# Patient Record
Sex: Female | Born: 1960 | Race: Black or African American | Hispanic: No | Marital: Married | Smoking: Never smoker
Health system: Southern US, Community
[De-identification: ages and names within clinical notes are randomized; demographics above are authoritative.]

## PROBLEM LIST (undated history)

## (undated) DIAGNOSIS — I639 Cerebral infarction, unspecified: Secondary | ICD-10-CM

## (undated) DIAGNOSIS — I10 Essential (primary) hypertension: Secondary | ICD-10-CM

## (undated) HISTORY — PX: OOPHORECTOMY: SHX86

## (undated) HISTORY — PX: TUBAL LIGATION: SHX77

---

## 2004-09-26 ENCOUNTER — Ambulatory Visit: Payer: Self-pay | Admitting: Family Medicine

## 2005-01-09 ENCOUNTER — Ambulatory Visit: Payer: Self-pay | Admitting: Family Medicine

## 2005-05-08 ENCOUNTER — Ambulatory Visit: Payer: Self-pay | Admitting: Family Medicine

## 2005-05-08 ENCOUNTER — Other Ambulatory Visit: Admission: RE | Admit: 2005-05-08 | Discharge: 2005-05-08 | Payer: Self-pay | Admitting: Family Medicine

## 2005-06-04 ENCOUNTER — Ambulatory Visit: Payer: Self-pay | Admitting: Internal Medicine

## 2005-08-02 ENCOUNTER — Encounter: Admission: RE | Admit: 2005-08-02 | Discharge: 2005-08-02 | Payer: Self-pay | Admitting: Family Medicine

## 2016-09-14 ENCOUNTER — Encounter (HOSPITAL_COMMUNITY): Payer: Self-pay | Admitting: Emergency Medicine

## 2016-09-14 ENCOUNTER — Ambulatory Visit (HOSPITAL_COMMUNITY)
Admission: EM | Admit: 2016-09-14 | Discharge: 2016-09-14 | Disposition: A | Discharge status: Home / Self Care | Payer: 59 | Attending: Emergency Medicine | Admitting: Emergency Medicine

## 2016-09-14 DIAGNOSIS — R319 Hematuria, unspecified: Secondary | ICD-10-CM | POA: Diagnosis present

## 2016-09-14 DIAGNOSIS — I10 Essential (primary) hypertension: Secondary | ICD-10-CM | POA: Insufficient documentation

## 2016-09-14 DIAGNOSIS — N390 Urinary tract infection, site not specified: Secondary | ICD-10-CM | POA: Diagnosis present

## 2016-09-14 DIAGNOSIS — R300 Dysuria: Secondary | ICD-10-CM | POA: Diagnosis present

## 2016-09-14 DIAGNOSIS — Z8673 Personal history of transient ischemic attack (TIA), and cerebral infarction without residual deficits: Secondary | ICD-10-CM | POA: Diagnosis not present

## 2016-09-14 DIAGNOSIS — Z7982 Long term (current) use of aspirin: Secondary | ICD-10-CM | POA: Insufficient documentation

## 2016-09-14 DIAGNOSIS — N3001 Acute cystitis with hematuria: Secondary | ICD-10-CM | POA: Diagnosis not present

## 2016-09-14 HISTORY — DX: Essential (primary) hypertension: I10

## 2016-09-14 HISTORY — DX: Cerebral infarction, unspecified: I63.9

## 2016-09-14 LAB — POCT URINALYSIS DIP (DEVICE)
Bilirubin Urine: NEGATIVE
Glucose, UA: NEGATIVE mg/dL
Ketones, ur: NEGATIVE mg/dL
Nitrite: NEGATIVE
Protein, ur: 100 mg/dL — AB
Specific Gravity, Urine: 1.02 (ref 1.005–1.030)
UROBILINOGEN UA: 0.2 mg/dL (ref 0.0–1.0)
pH: 7 (ref 5.0–8.0)

## 2016-09-14 MED ORDER — PHENAZOPYRIDINE HCL 200 MG PO TABS: 200.0000 mg | ORAL_TABLET | Freq: Three times a day (TID) | ORAL | 0 refills | Status: DC

## 2016-09-14 MED ORDER — CEPHALEXIN 500 MG PO CAPS: 500.0000 mg | ORAL_CAPSULE | Freq: Four times a day (QID) | ORAL | 0 refills | Status: DC

## 2016-09-14 NOTE — ED Provider Notes (Signed)
[facility]    CSN: [id number] Arrival date & time: 09/14/16  1611     History   Chief Complaint Chief Complaint  Patient presents with  . Urinary Tract Infection    HPI Rhonda Cortez is a 55 y.o. female.   HPI  She is a 55 year old woman here for evaluation of dysuria. Her symptoms started this morning with urgency, frequency, dysuria, and hematuria. She denies abdominal pain, nausea, vomiting, fevers, flank pain. She did have sex recently after a long period of celibacy.   Past Medical History:  Diagnosis Date  . Hypertension   . Stroke [facility](HCC)     There are no active problems to display for this patient.   Past Surgical History:  Procedure Laterality Date  . OOPHORECTOMY    . TUBAL LIGATION      OB History    No data available       Home Medications    Prior to Admission medications   Medication Sig Start Date End Date Taking? Authorizing Provider  amLODipine (NORVASC) 10 MG tablet Take 10 mg by mouth daily.   Yes Historical Provider, MD  aspirin 325 MG tablet Take 325 mg by mouth daily.   Yes Historical Provider, MD  cephALEXin (KEFLEX) 500 MG capsule Take 1 capsule (500 mg total) by mouth 4 (four) times daily. 09/14/16   Charm RingsErin J Danaysha Kirn, MD  phenazopyridine (PYRIDIUM) 200 MG tablet Take 1 tablet (200 mg total) by mouth 3 (three) times daily. 09/14/16   Charm RingsErin J Masen Luallen, MD    Family History History reviewed. No pertinent family history.  Social History Social History  Substance Use Topics  . Smoking status: Never Smoker  . Smokeless tobacco: Never Used  . Alcohol use No     Allergies   Patient has no known allergies.   Review of Systems Review of Systems As in history of present illness  Physical Exam Triage Vital Signs ED Triage Vitals [09/14/16 1650]  Enc Vitals Group     BP 156/74     Pulse Rate 92     Resp 16     Temp 98.4 F (36.9 C)     Temp Source Oral     SpO2 100 %     Weight      Height      Head Circumference    Peak Flow      Pain Score      Pain Loc      Pain Edu?      Excl. in GC?    No data found.   Updated Vital Signs BP 156/74 (BP Location: Left Arm)   Pulse 92   Temp 98.4 F (36.9 C) (Oral)   Resp 16   SpO2 100%   Visual Acuity Right Eye Distance:   Left Eye Distance:   Bilateral Distance:    Right Eye Near:   Left Eye Near:    Bilateral Near:     Physical Exam  Constitutional: She is oriented to person, place, and time. She appears well-developed and well-nourished. No distress.  Cardiovascular: Normal rate.   Pulmonary/Chest: Effort normal.  Abdominal:  No CVA tenderness  Neurological: She is alert and oriented to person, place, and time.     UC Treatments / Results  Labs (all labs ordered are listed, but only abnormal results are displayed) Labs Reviewed  POCT URINALYSIS DIP (DEVICE) - Abnormal; Notable for the following:       Result Value   Hgb  urine dipstick LARGE (*)    Protein, ur 100 (*)    Leukocytes, UA SMALL (*)    All other components within normal limits  URINE CULTURE    EKG  EKG Interpretation None       Radiology No results found.  Procedures Procedures (including critical care time)  Medications Ordered in UC Medications - No data to display   Initial Impression / Assessment and Plan / UC Course  I have reviewed the triage vital signs and the nursing notes.  Pertinent labs & imaging results that were available during my care of the patient were reviewed by me and considered in my medical decision making (see chart for details).  Clinical Course     Treatment with Keflex and Pyridium. Urine sent for culture. Follow up as needed.  Final Clinical Impressions(s) / UC Diagnoses   Final diagnoses:  Acute cystitis with hematuria    New Prescriptions New Prescriptions   CEPHALEXIN (KEFLEX) 500 MG CAPSULE    Take 1 capsule (500 mg total) by mouth 4 (four) times daily.   PHENAZOPYRIDINE (PYRIDIUM) 200 MG TABLET    Take 1  tablet (200 mg total) by mouth 3 (three) times daily.     Charm RingsErin J Loralyn Rachel, MD 09/14/16 [phone]

## 2016-09-14 NOTE — ED Triage Notes (Signed)
Here for UTI sx onset today associated w/hematuira, urinary urgency/freq, dysuria  Denies fevers, n/v, abd pain  A&O x4... NAD

## 2016-09-14 NOTE — Discharge Instructions (Signed)
You have a urinary tract infection. Take Keflex 4 times a day for 5 days. Use the Pyridium 3 times a day to numb the bladder while the antibiotics take effect. This will turn your urine orange. I sent your urine for culture. We will call you if we need to change antibiotics. Follow-up as needed.

## 2016-09-16 LAB — URINE CULTURE: Culture: 70000 — AB

## 2016-09-17 ENCOUNTER — Telehealth (HOSPITAL_COMMUNITY): Payer: Self-pay | Admitting: Emergency Medicine

## 2016-09-17 NOTE — Telephone Encounter (Signed)
LM on [phone] Called to give lab results and to see how pt is doing from recent visit on 09/14/16 Notified pt in gen message of abnormal results and that there is NO need to call back Unless not feeling any better, not tolerating meds well or if wanting to know lab results. Also let pt know labs can be obtained from MyChart

## 2016-09-17 NOTE — Telephone Encounter (Signed)
-----   Message from Eustace MooreLaura W Murray, MD sent at 09/16/2016  2:37 PM EST ----- Please let patient know that urine culture was positive for E coli, [facility]  Recheck for further evaluation if symptoms persist.  LM

## 2019-12-23 DIAGNOSIS — I639 Cerebral infarction, unspecified: Secondary | ICD-10-CM | POA: Insufficient documentation

## 2020-01-06 ENCOUNTER — Ambulatory Visit: Payer: Self-pay | Attending: Family

## 2020-01-06 DIAGNOSIS — Z23 Encounter for immunization: Secondary | ICD-10-CM

## 2020-01-06 NOTE — Progress Notes (Signed)
   Covid-19 Vaccination Clinic  Name:  Rhonda Cortez    MRN: [id number] DOB: Jan 10, 1961  01/06/2020  Rhonda Cortez was observed post Covid-19 immunization for 15 minutes without incident. She was provided with Vaccine Information Sheet and instruction to access the V-Safe system.   Rhonda Cortez was instructed to call 911 with any severe reactions post vaccine: Marland Kitchen Difficulty breathing  . Swelling of face and throat  . A fast heartbeat  . A bad rash all over body  . Dizziness and weakness   Immunizations Administered    Name Date Dose VIS Date Route   Moderna COVID-19 Vaccine 01/06/2020  3:25 PM 0.5 mL 09/14/2019 Intramuscular   Manufacturer: Moderna   Lot: [id number]   NDC: 90383-338-32

## 2020-01-06 NOTE — Progress Notes (Signed)
   Covid-19 Vaccination Clinic  Name:  Rhonda Cortez    MRN: [id number] DOB: 04/10/1961  01/06/2020  [name] was observed post Covid-19 immunization for 15 minutes without incident. She was provided with Vaccine Information Sheet and instruction to access the V-Safe system.   [name] was instructed to call 911 with any severe reactions post vaccine: . Difficulty breathing  . Swelling of face and throat  . A fast heartbeat  . A bad rash all over body  . Dizziness and weakness   Immunizations Administered    Name Date Dose VIS Date Route   Moderna COVID-19 Vaccine 01/06/2020  3:25 PM 0.5 mL 09/14/2019 Intramuscular   Manufacturer: Moderna   Lot: [id number]   NDC: 80777-273-10     

## 2020-02-08 ENCOUNTER — Ambulatory Visit: Payer: Self-pay | Attending: Family

## 2020-02-08 DIAGNOSIS — Z23 Encounter for immunization: Secondary | ICD-10-CM

## 2020-02-08 NOTE — Progress Notes (Signed)
   Covid-19 Vaccination Clinic  Name:  Rhonda Cortez    MRN: [id number] DOB: [birth year]  02/08/2020  [name] was observed post Covid-19 immunization for 15 minutes without incident. She was provided with Vaccine Information Sheet and instruction to access the V-Safe system.   [name] was instructed to call 911 with any severe reactions post vaccine: Marland Kitchen Difficulty breathing  . Swelling of face and throat  . A fast heartbeat  . A bad rash all over body  . Dizziness and weakness   Immunizations Administered    Name Date Dose VIS Date Route   Moderna COVID-19 Vaccine 02/08/2020 11:51 AM 0.5 mL 09/2019 Intramuscular   Manufacturer: Moderna   Lot: [id number]   NDC: 84210-312-81

## 2020-04-17 ENCOUNTER — Ambulatory Visit (HOSPITAL_COMMUNITY)
Admission: EM | Admit: 2020-04-17 | Discharge: 2020-04-17 | Disposition: A | Discharge status: Home / Self Care | Payer: PRIVATE HEALTH INSURANCE | Attending: Family Medicine | Admitting: Family Medicine

## 2020-04-17 ENCOUNTER — Other Ambulatory Visit: Payer: Self-pay

## 2020-04-17 ENCOUNTER — Encounter (HOSPITAL_COMMUNITY): Payer: Self-pay | Admitting: Emergency Medicine

## 2020-04-17 ENCOUNTER — Ambulatory Visit (INDEPENDENT_AMBULATORY_CARE_PROVIDER_SITE_OTHER): Payer: PRIVATE HEALTH INSURANCE

## 2020-04-17 ENCOUNTER — Telehealth (HOSPITAL_COMMUNITY): Payer: Self-pay | Admitting: Family Medicine

## 2020-04-17 DIAGNOSIS — M79644 Pain in right finger(s): Secondary | ICD-10-CM

## 2020-04-17 DIAGNOSIS — S63639A Sprain of interphalangeal joint of unspecified finger, initial encounter: Secondary | ICD-10-CM

## 2020-04-17 DIAGNOSIS — M79641 Pain in right hand: Secondary | ICD-10-CM

## 2020-04-17 DIAGNOSIS — M7989 Other specified soft tissue disorders: Secondary | ICD-10-CM

## 2020-04-17 MED ORDER — DICLOFENAC SODIUM 75 MG PO TBEC: 75.0000 mg | DELAYED_RELEASE_TABLET | Freq: Two times a day (BID) | ORAL | 0 refills | Status: DC

## 2020-04-17 NOTE — Telephone Encounter (Signed)
Resending rx from visit earlier today

## 2020-04-17 NOTE — ED Triage Notes (Signed)
Pt c/o of rt pointer finger pain on set Saturday. No injury noted to area. Pt did fall 1 month ago and injured rt middle finger. Pt denies taking advil/tylenol for pain.

## 2020-04-21 ENCOUNTER — Other Ambulatory Visit: Payer: Self-pay

## 2020-04-21 ENCOUNTER — Ambulatory Visit (INDEPENDENT_AMBULATORY_CARE_PROVIDER_SITE_OTHER): Payer: PRIVATE HEALTH INSURANCE | Admitting: Surgical

## 2020-04-21 DIAGNOSIS — S63631A Sprain of interphalangeal joint of left index finger, initial encounter: Secondary | ICD-10-CM

## 2020-04-21 DIAGNOSIS — S63639A Sprain of interphalangeal joint of unspecified finger, initial encounter: Secondary | ICD-10-CM

## 2020-04-27 ENCOUNTER — Telehealth: Payer: Self-pay

## 2020-04-27 NOTE — Telephone Encounter (Signed)
LMOM for patient to call back so we can schedule her an appt with August Saucer next week per Franky Macho to check her swelling

## 2020-05-01 NOTE — ED Provider Notes (Signed)
[facility]   [id number] 04/17/20 Arrival Time: 5956  ASSESSMENT & PLAN:  1. Volar plate injury of finger, initial encounter   2. Finger pain, right     I have personally viewed the imaging studies ordered this visit. Volar plate injury of the mid right 3rd finger.   Meds ordered this encounter  Medications  . diclofenac (VOLTAREN) 75 MG EC tablet    Sig: Take 1 tablet (75 mg total) by mouth 2 (two) times daily.    Dispense:  14 tablet    Refill:  0    Orders Placed This Encounter  Procedures  . DG Hand Complete Right    Recommend:  Follow-up Information    Schedule an appointment as soon as possible for a visit  with Bradly Bienenstock, MD.   Specialty: Orthopedic Surgery Contact information: [address] [place] [address] [phone]                Reviewed expectations re: course of current medical issues. Questions answered. Outlined signs and symptoms indicating need for more acute intervention. Patient verbalized understanding. After Visit Summary given.  SUBJECTIVE: History from: patient. Rhonda Cortez is a 59 y.o. female who reports right 2nd and 3rd finger pain. One month ago injury to R 3rd finger reported. No injury to 2nd finger. No extremity sensation changes or weakness. OTC Tylenol with some help.   Past Surgical History:  Procedure Laterality Date  . OOPHORECTOMY    . TUBAL LIGATION        OBJECTIVE:  Vitals:   04/17/20 1048  BP: (!) 155/95  Pulse: 80  Resp: 16  Temp: 97.9 F (36.6 C)  TempSrc: Oral  SpO2: 100%    General appearance: alert; no distress HEENT: [place]; AT Neck: supple with FROM Resp: unlabored respirations Extremities: . TTP over mid R 3rd digit; trouble with full flexion; normal sensation; normal cap refill Skin: warm and dry; no visible rashes Psychological: alert and cooperative; normal mood and affect  Imaging: DG Hand Complete Right  Result Date: 04/17/2020 CLINICAL DATA:   Injury. Second and third finger pain. Pain for 2 weeks after falling. EXAM: RIGHT HAND - COMPLETE 3+ VIEW COMPARISON:  None. FINDINGS: There is a volar plate injury of the middle phalanx of the third digit. There is associated soft tissue swelling. The index finger is unremarkable. No other fractures. Degenerative changes are identified at the distal interphalangeal joint of the index finger. IMPRESSION: Volar plate injury of the middle phalanx of the third digit. Electronically Signed   By: Norva Pavlov M.D.   On: 04/17/2020 11:47      No Known Allergies  Past Medical History:  Diagnosis Date  . Hypertension   . Stroke [facility])    Social History   Socioeconomic History  . Marital status: Married    Spouse name: Not on file  . Number of children: Not on file  . Years of education: Not on file  . Highest education level: Not on file  Occupational History  . Not on file  Tobacco Use  . Smoking status: Never Smoker  . Smokeless tobacco: Never Used  Substance and Sexual Activity  . Alcohol use: No  . Drug use: Never  . Sexual activity: Not on file  Other Topics Concern  . Not on file  Social History Narrative  . Not on file   Social Determinants of Health   Financial Resource Strain:   . Difficulty of Paying Living Expenses:  Food Insecurity:   . Worried About Programme researcher, broadcasting/film/video in the Last Year:   . Barista in the Last Year:   Transportation Needs:   . Freight forwarder (Medical):   Marland Kitchen Lack of Transportation (Non-Medical):   Physical Activity:   . Days of Exercise per Week:   . Minutes of Exercise per Session:   Stress:   . Feeling of Stress :   Social Connections:   . Frequency of Communication with Friends and Family:   . Frequency of Social Gatherings with Friends and Family:   . Attends Religious Services:   . Active Member of Clubs or Organizations:   . Attends Banker Meetings:   Marland Kitchen Marital Status:    Family History  Problem  Relation Age of Onset  . Hypertension Father    Past Surgical History:  Procedure Laterality Date  . OOPHORECTOMY    . TUBAL LIGATION        Mardella Layman, MD 05/01/20 [phone]

## 2020-05-17 ENCOUNTER — Encounter: Payer: Self-pay | Admitting: Surgical

## 2020-05-17 ENCOUNTER — Ambulatory Visit: Payer: PRIVATE HEALTH INSURANCE | Admitting: Orthopedic Surgery

## 2020-05-17 ENCOUNTER — Ambulatory Visit (INDEPENDENT_AMBULATORY_CARE_PROVIDER_SITE_OTHER): Payer: PRIVATE HEALTH INSURANCE

## 2020-05-17 DIAGNOSIS — M79646 Pain in unspecified finger(s): Secondary | ICD-10-CM

## 2020-05-17 DIAGNOSIS — S63639A Sprain of interphalangeal joint of unspecified finger, initial encounter: Secondary | ICD-10-CM

## 2020-05-17 DIAGNOSIS — M79641 Pain in right hand: Secondary | ICD-10-CM

## 2020-05-17 DIAGNOSIS — S63632A Sprain of interphalangeal joint of right middle finger, initial encounter: Secondary | ICD-10-CM | POA: Diagnosis not present

## 2020-05-17 NOTE — Progress Notes (Signed)
Office Visit Note   Patient: Rhonda Cortez           Date of Birth: October 29, 1960           MRN: [id number] Visit Date: 04/21/2020 Requested by: No referring provider defined for this encounter. PCP: Patient, No Pcp Per  Subjective: Chief Complaint  Patient presents with  . Right Hand - Pain    HPI: Rhonda Cortez is a 59 y.o. female who presents to the office complaining of finger pain.  Patient complains of distal right index finger pain as well as middle finger pain.She has had pain for several weeks following a fall.  She notes middle finger pain is improving significantly and swelling has improved but persists to a lesser degree.  Her main source of pain is her right index finger DIP joint.  She is using Diclofenac.Denies history of RA or gout.                ROS: All systems reviewed are negative as they relate to the chief complaint within the history of present illness.  Patient denies fevers or chills.  Assessment & Plan: Visit Diagnoses:  1. Volar plate injury of finger, initial encounter     Plan: Patient is a 59 y.o. Female who presents complaining of right finger pains.  She has radiographs that were reviewed and revealed degeneration of the index DIP joint with a small volar plate fracture of the middle finger PIP joint. The volar plate injury involves <30% of the joint surface so plan for nonoperative management.  Recommended she hold off on heavy lifting with the right hand and continue moving the finger as she can tolerate to prevent stiffness. Plan for 4 week follow-up with Dr. August Saucer for re-evaluation. May need OT at that time for finger stiffness.  Patient agreed with plan.  Discontinued oral Voltaren due to history of stroke.  Plan to treat with topical voltaren.    Follow-Up Instructions: No follow-ups on file.   Orders:  No orders of the defined types were placed in this encounter.  No orders of the defined types were placed in this encounter.     Procedures: No  procedures performed   Clinical Data: No additional findings.  Objective: Vital Signs: There were no vitals taken for this visit.  Physical Exam:  Constitutional: Patient appears well-developed HEENT:  Head: Normocephalic Eyes:EOM are normal Neck: Normal range of motion Cardiovascular: Normal rate Pulmonary/chest: Effort normal Neurologic: Patient is alert Skin: Skin is warm Psychiatric: Patient has normal mood and affect  Ortho Exam: Ortho exam demonstrates right middle finger with mild to moderate swelling of the PIP joint. Mild TTP with volar palpation over PIP joint.  Mild reduced ROM due to swelling.  Excellent strength of DIP flex and PIP extension of the right middle finger.  Moderate TTP over the ulnar aspect of the right index DIP joint.  No associated swelling.  No TTP throughout the other joints of the hand or wrist.    Specialty Comments:  No specialty comments available.  Imaging: No results found.   PMFS History: There are no problems to display for this patient.  Past Medical History:  Diagnosis Date  . Hypertension   . Stroke [facility])     Family History  Problem Relation Age of Onset  . Hypertension Father     Past Surgical History:  Procedure Laterality Date  . OOPHORECTOMY    . TUBAL LIGATION     Social History  Occupational History  . Not on file  Tobacco Use  . Smoking status: Never Smoker  . Smokeless tobacco: Never Used  Substance and Sexual Activity  . Alcohol use: No  . Drug use: Never  . Sexual activity: Not on file

## 2020-05-19 ENCOUNTER — Encounter: Payer: Self-pay | Admitting: Orthopedic Surgery

## 2020-05-19 NOTE — Progress Notes (Signed)
   Office Visit Note   Patient: Rhonda Cortez           Date of Birth: January 03, 1961           MRN: [id number] Visit Date: 05/17/2020 Requested by: No referring provider defined for this encounter. PCP: Patient, No Pcp Per  Subjective: Chief Complaint  Patient presents with  . Right Hand - Pain    HPI: Rhonda Cortez is a 59 year old patient here for 4-week follow-up volar plate injury right long finger.  Hard for her to make a fist but otherwise she is making progress.  No episodes of instability.              ROS: All systems reviewed are negative as they relate to the chief complaint within the history of present illness.  Patient denies  fevers or chills.   Assessment & Plan: Visit Diagnoses:  1. Volar plate injury of finger, initial encounter   2. Pain of middle finger     Plan: Impression is volar plate injury right middle finger with pretty reasonable range of motion at this time.  Mild swelling is present.  This is normal for this stage for this injury.  Follow-up as needed.  Continue to work on range of motion exercises.  Follow-Up Instructions: Return if symptoms worsen or fail to improve.   Orders:  Orders Placed This Encounter  Procedures  . XR Hand Complete Right   No orders of the defined types were placed in this encounter.     Procedures: No procedures performed   Clinical Data: No additional findings.  Objective: Vital Signs: There were no vitals taken for this visit.  Physical Exam:   Constitutional: Patient appears well-developed HEENT:  Head: Normocephalic Eyes:EOM are normal Neck: Normal range of motion Cardiovascular: Normal rate Pulmonary/chest: Effort normal Neurologic: Patient is alert Skin: Skin is warm Psychiatric: Patient has normal mood and affect    Ortho Exam: Ortho exam of that right middle finger demonstrates some swelling around the PIP joint.  Patient does have full extension without deformity.  Lacking about 15 degrees of flexion at  the PIP joint right versus left.  No rotational deformity present.  Collaterals are stable to varus and valgus stress at 0 and 30 degrees.  Specialty Comments:  No specialty comments available.  Imaging: No results found.   PMFS History: There are no problems to display for this patient.  Past Medical History:  Diagnosis Date  . Hypertension   . Stroke [facility])     Family History  Problem Relation Age of Onset  . Hypertension Father     Past Surgical History:  Procedure Laterality Date  . OOPHORECTOMY    . TUBAL LIGATION     Social History   Occupational History  . Not on file  Tobacco Use  . Smoking status: Never Smoker  . Smokeless tobacco: Never Used  Substance and Sexual Activity  . Alcohol use: No  . Drug use: Never  . Sexual activity: Not on file

## 2020-10-17 ENCOUNTER — Ambulatory Visit: Payer: PRIVATE HEALTH INSURANCE

## 2020-11-15 IMAGING — DX DG HAND COMPLETE 3+V*R*
3 series · 3 of 3 positions shown · non-contrast
Comparison: None.

CLINICAL DATA: Injury. Second and third finger pain. Pain for 2
weeks after falling.

EXAM:
RIGHT HAND - COMPLETE 3+ VIEW

[hand pa]
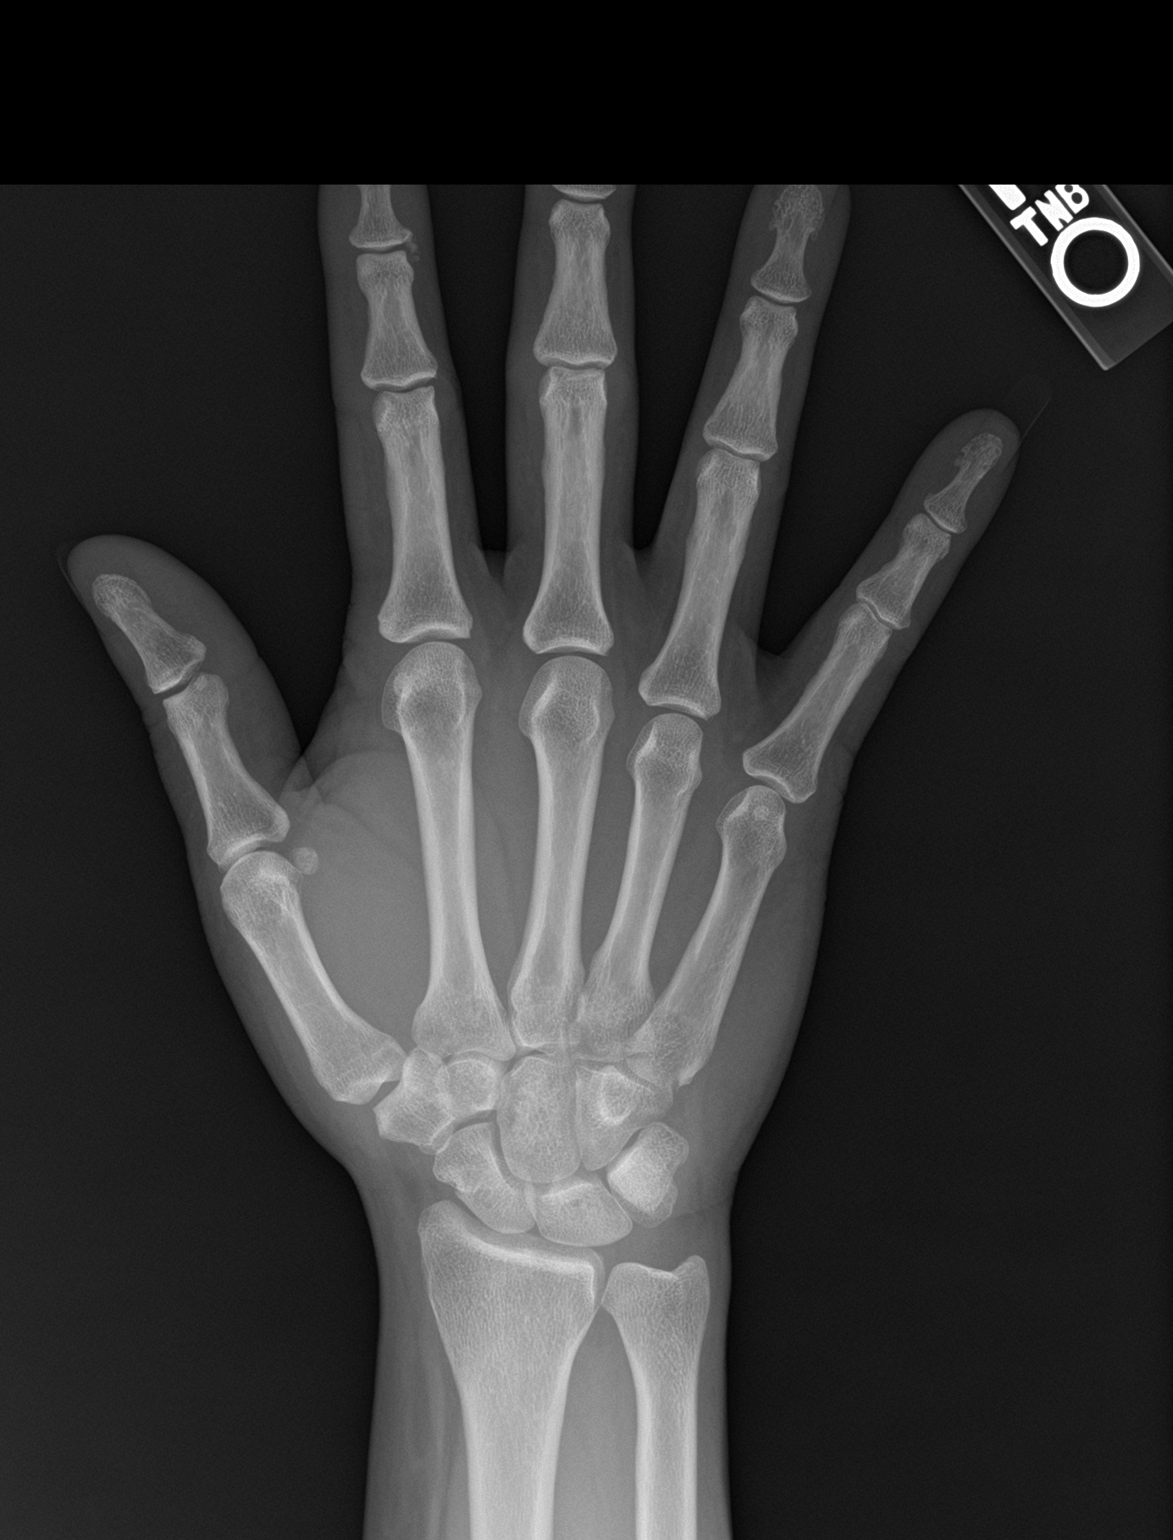

[hand obl]
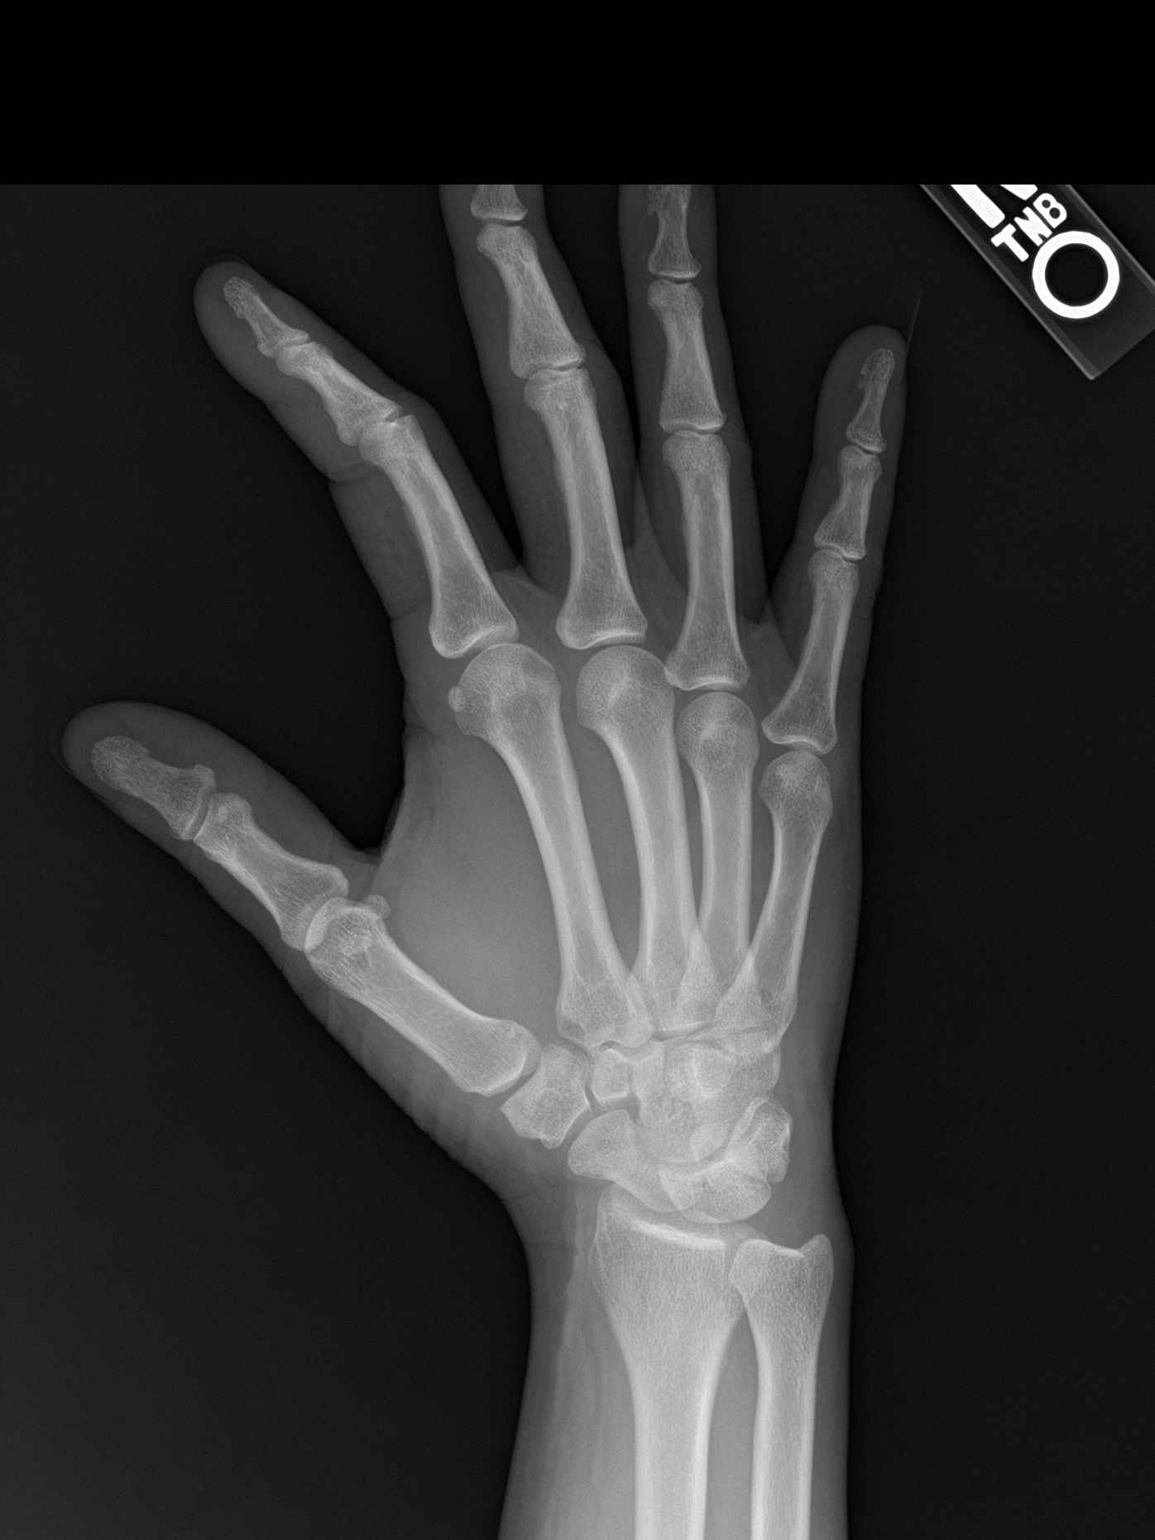

[hand lat]
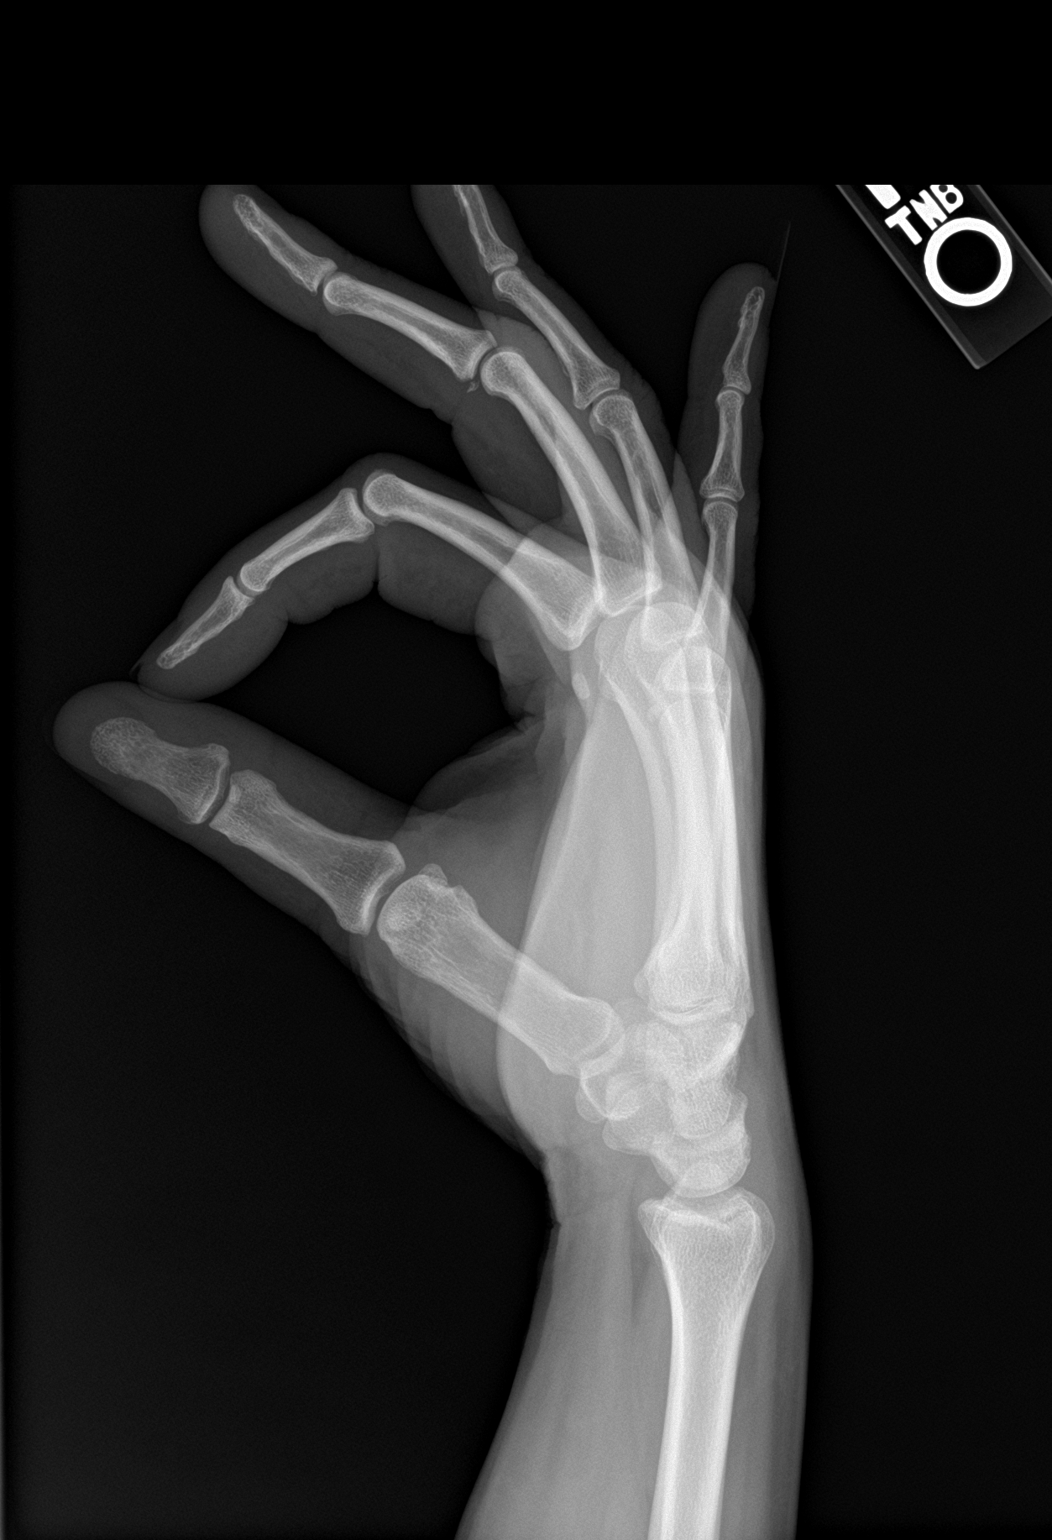

[3 of 3 positions shown; findings below may reference images not displayed]

FINDINGS: There is a volar plate injury of the middle phalanx of the third
digit. There is associated soft tissue swelling. The index finger is
unremarkable. No other fractures. Degenerative changes are
identified at the distal interphalangeal joint of the index finger.
IMPRESSION: Volar plate injury of the middle phalanx of the third digit.

## 2021-03-27 LAB — COLOGUARD: COLOGUARD: NEGATIVE

## 2022-06-27 LAB — COLOGUARD

## 2022-07-12 LAB — HM COLONOSCOPY

## 2022-07-18 LAB — COLOGUARD
COLOGUARD: POSITIVE — AB
Cologuard: POSITIVE — AB

## 2022-12-20 ENCOUNTER — Ambulatory Visit (INDEPENDENT_AMBULATORY_CARE_PROVIDER_SITE_OTHER): Payer: BC Managed Care – PPO

## 2022-12-20 ENCOUNTER — Encounter (HOSPITAL_COMMUNITY): Payer: Self-pay | Admitting: *Deleted

## 2022-12-20 ENCOUNTER — Ambulatory Visit (HOSPITAL_COMMUNITY)
Admission: EM | Admit: 2022-12-20 | Discharge: 2022-12-20 | Disposition: A | Discharge status: Home / Self Care | Payer: BC Managed Care – PPO | Attending: Emergency Medicine | Admitting: Emergency Medicine

## 2022-12-20 DIAGNOSIS — R1906 Epigastric swelling, mass or lump: Secondary | ICD-10-CM | POA: Diagnosis present

## 2022-12-20 DIAGNOSIS — K5900 Constipation, unspecified: Secondary | ICD-10-CM | POA: Insufficient documentation

## 2022-12-20 LAB — BASIC METABOLIC PANEL
Anion gap: 14 (ref 5–15)
BUN: 11 mg/dL (ref 6–20)
CO2: 22 mmol/L (ref 22–32)
Calcium: 8.9 mg/dL (ref 8.9–10.3)
Chloride: 103 mmol/L (ref 98–111)
Creatinine, Ser: 1.65 mg/dL — ABNORMAL HIGH (ref 0.44–1.00)
GFR, Estimated: 35 mL/min — ABNORMAL LOW (ref >60)
Glucose, Bld: 124 mg/dL — ABNORMAL HIGH (ref 70–99)
Potassium: 4.4 mmol/L (ref 3.5–5.1)
Sodium: 139 mmol/L (ref 135–145)

## 2022-12-20 LAB — CBC
HCT: 47.5 % — ABNORMAL HIGH (ref 36.0–46.0)
Hemoglobin: 15.7 g/dL — ABNORMAL HIGH (ref 12.0–15.0)
MCH: 29.3 pg (ref 26.0–34.0)
MCHC: 33.1 g/dL (ref 30.0–36.0)
MCV: 88.8 fL (ref 80.0–100.0)
Platelets: 390 10*3/uL (ref 150–400)
RBC: 5.35 MIL/uL — ABNORMAL HIGH (ref 3.87–5.11)
RDW: 13.2 % (ref 11.5–15.5)
WBC: 12.6 10*3/uL — ABNORMAL HIGH (ref 4.0–10.5)
nRBC: 0 % (ref 0.0–0.2)

## 2022-12-20 LAB — LIPASE, BLOOD: Lipase: 44 U/L (ref 11–51)

## 2022-12-20 MED ORDER — ALUM & MAG HYDROXIDE-SIMETH 200-200-20 MG/5ML PO SUSP
ORAL | Status: AC
  Filled 2022-12-20: qty 30

## 2022-12-20 MED ORDER — DOCUSATE SODIUM 100 MG PO CAPS: 100.0000 mg | ORAL_CAPSULE | Freq: Two times a day (BID) | ORAL | 0 refills | Status: DC

## 2022-12-20 MED ORDER — LIDOCAINE VISCOUS HCL 2 % MT SOLN
15.0000 mL | Freq: Once | OROMUCOSAL | Status: AC
  Administered 2022-12-20: 15 mL via OROMUCOSAL

## 2022-12-20 MED ORDER — LIDOCAINE VISCOUS HCL 2 % MT SOLN
OROMUCOSAL | Status: AC
  Filled 2022-12-20: qty 15

## 2022-12-20 MED ORDER — ALUM & MAG HYDROXIDE-SIMETH 200-200-20 MG/5ML PO SUSP
30.0000 mL | Freq: Once | ORAL | Status: AC
  Administered 2022-12-20: 30 mL via ORAL

## 2022-12-20 NOTE — ED Provider Notes (Signed)
[place]    CSN: [id number] Arrival date & time: 12/20/22  0820      History   Chief Complaint Chief Complaint  Patient presents with   Abdominal Pain    HPI Rhonda Cortez is a 62 y.o. female.   Reports fatigue and epigastric pain for the past week. She drank 4 bottles of water yesterday and feels like she still needed more. She wants to eat, but when she does she has burping and feeling like she is nauseated, she has been feeling full with small amounts of food. Denies current abdominal pain, nausea, chest pain, SOB, fevers or recent illness.   Feels like if she throws up, she will throw up 'bowels.' Reports BMs are normal like clockwork, she has been having regular bowl movements, but they are 'slivers.' She endorses feeling constipated.   Has not tried any medications for this. Denies hx of bowel obstructions or GI issues.   Hx of HTN, checks BP every morning, was 124/76 this AM, reports white coat syndrome.   The history is provided by the patient.  Abdominal Pain Associated symptoms: constipation, fatigue and nausea   Associated symptoms: no chest pain, no chills, no cough, no dysuria, no fever, no hematuria, no shortness of breath, no sore throat and no vomiting     Past Medical History:  Diagnosis Date   Hypertension    Stroke ([place])     There are no problems to display for this patient.   Past Surgical History:  Procedure Laterality Date   OOPHORECTOMY     TUBAL LIGATION      OB History   No obstetric history on file.      Home Medications    Prior to Admission medications   Medication Sig Start Date End Date Taking? Authorizing Provider  amLODipine (NORVASC) 10 MG tablet Take 10 mg by mouth daily.   Yes [provider]  aspirin 325 MG tablet Take 325 mg by mouth daily.   Yes [provider]  docusate sodium (COLACE) 100 MG capsule Take 1 capsule (100 mg total) by mouth every 12 (twelve) hours. 12/20/22  Yes Deland Slocumb,  [place], FNP    Family History Family History  Problem Relation Age of Onset   Hypertension Father     Social History Social History   Tobacco Use   Smoking status: Never   Smokeless tobacco: Never  Vaping Use   Vaping Use: Never used  Substance Use Topics   Alcohol use: No   Drug use: Never     Allergies   Patient has no known allergies.   Review of Systems Review of Systems  Constitutional:  Positive for fatigue. Negative for chills and fever.  HENT:  Negative for ear pain and sore throat.   Eyes:  Negative for pain and visual disturbance.  Respiratory:  Negative for cough and shortness of breath.   Cardiovascular:  Negative for chest pain and palpitations.  Gastrointestinal:  Positive for abdominal pain, constipation and nausea. Negative for abdominal distention, blood in stool and vomiting.  Genitourinary:  Negative for dysuria and hematuria.  Musculoskeletal:  Negative for arthralgias and back pain.  Skin:  Negative for color change and rash.  Neurological:  Negative for seizures and syncope.  All other systems reviewed and are negative.    Physical Exam Triage Vital Signs ED Triage Vitals  Enc Vitals Group     BP 12/20/22 0921 (!) 162/104     Pulse Rate 12/20/22 0921 Marland Kitchen)  113     Resp 12/20/22 0921 18     Temp 12/20/22 0921 98.6 F (37 C)     Temp Source 12/20/22 0921 Oral     SpO2 12/20/22 0921 97 %     Weight --      Height --      Head Circumference --      Peak Flow --      Pain Score 12/20/22 0919 0     Pain Loc --      Pain Edu? --      Excl. in [place]? --    No data found.  Updated Vital Signs BP (!) 162/104 (BP Location: Right Arm)   Pulse (!) 113   Temp 98.6 F (37 C) (Oral)   Resp 18   SpO2 97%   Visual Acuity Right Eye Distance:   Left Eye Distance:   Bilateral Distance:    Right Eye Near:   Left Eye Near:    Bilateral Near:     Physical Exam Vitals and nursing note reviewed.  Constitutional:      General: She is not  in acute distress.    Appearance: She is well-developed.  HENT:     Head: Normocephalic and atraumatic.  Eyes:     General: Lids are normal.     Conjunctiva/sclera: Conjunctivae normal.  Cardiovascular:     Rate and Rhythm: Normal rate and regular rhythm.     Heart sounds: Normal heart sounds, S1 normal and S2 normal. No murmur heard. Pulmonary:     Effort: Pulmonary effort is normal. No respiratory distress.     Breath sounds: Normal breath sounds.     Comments: Lungs vesicular posteriorly. Abdominal:     General: Abdomen is flat. Bowel sounds are normal.     Palpations: Abdomen is soft. There is no hepatomegaly, splenomegaly or mass.     Tenderness: There is no abdominal tenderness. There is no guarding or rebound. Negative signs include Murphy's sign, Rovsing's sign, McBurney's sign and psoas sign.     Hernia: No hernia is present.  Musculoskeletal:        General: No swelling.     Cervical back: Neck supple.  Skin:    General: Skin is warm and dry.     Capillary Refill: Capillary refill takes less than 2 seconds.  Neurological:     Mental Status: She is alert.  Psychiatric:        Mood and Affect: Mood normal.        Behavior: Behavior is cooperative.      UC Treatments / Results  Labs (all labs ordered are listed, but only abnormal results are displayed) Labs Reviewed  CBC  BASIC METABOLIC PANEL  LIPASE, BLOOD    EKG   Radiology DG Abd 2 Views  Result Date: 12/20/2022 CLINICAL DATA:  Abdominal pain, constipation. EXAM: ABDOMEN - 2 VIEW COMPARISON:  None Available. FINDINGS: No abnormal bowel dilatation is noted. Mild amount of stool seen throughout the colon. Phleboliths are noted in the pelvis as well as possible calcified uterine fibroid. IMPRESSION: No abnormal bowel dilatation.  Mild stool burden. Electronically Signed   By: Marijo Conception M.D.   On: 12/20/2022 10:11    Procedures Procedures (including critical care time)  Medications Ordered in  UC Medications  alum & mag hydroxide-simeth (MAALOX/MYLANTA) 200-200-20 MG/5ML suspension 30 mL (30 mLs Oral Given 12/20/22 0956)  lidocaine (XYLOCAINE) 2 % viscous mouth solution 15 mL (15 mLs Mouth/Throat Given 12/20/22  0956)    Initial Impression / Assessment and Plan / UC Course  I have reviewed the triage vital signs and the nursing notes.  Pertinent labs & imaging results that were available during my care of the patient were reviewed by me and considered in my medical decision making (see chart for details).  Vitals and triage reviewed, patient is hemodynamically stable.  Blood pressure was controlled this morning, prior to visit, history of whitecoat.  Epigastric fullness and discomfort, will trial GI cocktail. Abdomen with active bowel sounds, less and in lower quadrants, changes in bowel movements concerning for partial obstruction versus constipation versus colonic changes. Abdominal imaging without obstruction, mild stool burden. Non-tender to palpation, low concern for acute abdomen. Symptomatic relief discussed, advised follow-up with GI for colonoscopy and further evaluation.  Patient verbalized understanding, no questions at this time.    Final Clinical Impressions(s) / UC Diagnoses   Final diagnoses:  Constipation, unspecified constipation type  Epigastric fullness     Discharge Instructions      Overall your physical exam is reassuring.  Your abdominal x-ray did not show signs of obstruction, however, it did show constipation.  Please ensure you are drinking at least 64 ounces of water daily, increasing your dietary fiber and staying away from overly processed foods.  We obtained labs today and will call if results are abnormal.  Please follow-up with [facility], this call to schedule an appointment.  Return to clinic or the nearest emergency department if you develop vomiting of fecal matter, fever, or worsening of symptoms.     ED Prescriptions     Medication Sig  Dispense Auth. Provider   docusate sodium (COLACE) 100 MG capsule Take 1 capsule (100 mg total) by mouth every 12 (twelve) hours. 60 capsule Kruze Atchley, [place], [place]      I have reviewed the PDMP during this encounter.   Emmaline Wahba, [place], [place] 12/20/22 1024

## 2022-12-20 NOTE — ED Triage Notes (Signed)
Pt states she has a full feeling in her epigastric area, belching a lot, she doesn't have a taste for anything she would normally eat. She feels like if she vomits she will throw up stool. She feels extra tired recently. She states her sx have been going on a week.    She had a PCP appt for June but When in clinic today we made her am appt with PCP for 01/03/2023

## 2022-12-20 NOTE — Discharge Instructions (Addendum)
Overall your physical exam is reassuring.  Your abdominal x-ray did not show signs of obstruction, however, it did show constipation.  Please ensure you are drinking at least 64 ounces of water daily, increasing your dietary fiber and staying away from overly processed foods.  We obtained labs today and will call if results are abnormal.  Please follow-up with  GI, this call to schedule an appointment.  Return to clinic or the nearest emergency department if you develop vomiting of fecal matter, fever, or worsening of symptoms.

## 2023-01-03 ENCOUNTER — Encounter: Payer: Self-pay | Admitting: Family

## 2023-01-03 ENCOUNTER — Ambulatory Visit: Payer: BC Managed Care – PPO | Admitting: Family

## 2023-01-03 VITALS — BP 152/86 | HR 60 | Temp 97.5°F | Ht 63.5 in | Wt 139.4 lb

## 2023-01-03 DIAGNOSIS — K219 Gastro-esophageal reflux disease without esophagitis: Secondary | ICD-10-CM

## 2023-01-03 DIAGNOSIS — I10 Essential (primary) hypertension: Secondary | ICD-10-CM | POA: Insufficient documentation

## 2023-01-03 DIAGNOSIS — I69351 Hemiplegia and hemiparesis following cerebral infarction affecting right dominant side: Secondary | ICD-10-CM | POA: Diagnosis not present

## 2023-01-03 DIAGNOSIS — R053 Chronic cough: Secondary | ICD-10-CM

## 2023-01-03 DIAGNOSIS — Z8673 Personal history of transient ischemic attack (TIA), and cerebral infarction without residual deficits: Secondary | ICD-10-CM | POA: Insufficient documentation

## 2023-01-03 DIAGNOSIS — K5900 Constipation, unspecified: Secondary | ICD-10-CM | POA: Insufficient documentation

## 2023-01-03 DIAGNOSIS — Z1211 Encounter for screening for malignant neoplasm of colon: Secondary | ICD-10-CM

## 2023-01-03 MED ORDER — HYDROCHLOROTHIAZIDE 12.5 MG PO TABS: 12.5000 mg | ORAL_TABLET | Freq: Every morning | ORAL | 2 refills | Status: DC

## 2023-01-03 MED ORDER — PREDNISONE 20 MG PO TABS: ORAL_TABLET | ORAL | 0 refills | Status: DC

## 2023-01-03 NOTE — Patient Instructions (Addendum)
Welcome to [facility], It was a pleasure meeting you today!    As discussed, I have sent prednisone to help your cough to your pharmacy. Also recommend trying generic Delsym or Robitussin syrup as needed for the cough. Increase the fiber in your diet and continue taking stool softenor daily to help prevent constipation and drink at least 8 cups of water daily.  I have sent Hydrochlorothiazide to take every morning to help your blood pressure, this will make you urinate out the extra fluid, so important to stay hydrated! Also remember to eat a low sodium diet and exercise will help keep blood pressure down.  I have sent a referral to our gastroenterology for your Colonoscopy.  Please schedule a 1 month follow up visit today.    PLEASE NOTE: If you had any LAB tests please let us know if you have not heard back within a few days. You may see your results on MyChart before we have a chance to review them but we will give you a call once they are reviewed by [place]. If we ordered any REFERRALS today, please let us know if you have not heard from their office within the next week.  Let us know through MyChart if you are needing REFILLS, or have your pharmacy send [place] the request. You can also use MyChart to communicate with me or any office staff.  Please try these tips to maintain a healthy lifestyle: It is important that you exercise regularly at least 30 minutes 5 times a week. Think about what you will eat, plan ahead. Choose whole foods, & think  "clean, green, fresh or frozen" over canned, processed or packaged foods which are more sugary, salty, and fatty. 70 to 75% of food eaten should be fresh vegetables and protein. 2-3  meals daily with healthy snacks between meals, but must be whole fruit, protein or vegetables. Aim to eat over a 10 hour period when you are active, for example, 7am to 5pm, and then STOP after your last meal of the day, drinking only water.   Shorter eating windows, 6-8 hours, are showing benefits in heart disease and blood sugar regulation. Drink water every day! Shoot for 64 ounces daily = 8 cups, no other drink is as healthy! Fruit juice is best enjoyed in a healthy way, by EATING the fruit.

## 2023-01-03 NOTE — Assessment & Plan Note (Addendum)
New belching, bloating, epigastric pain advised on low acid diet, no lying down after meals pt hesitant to take meds f/u 1 month

## 2023-01-03 NOTE — Assessment & Plan Note (Signed)
2014 had stopped her Lisinopril d/t constant cough but her PCP would not change her med, so BP got really high.  Affected her right side, did rehab for 66mos, still can't write very well, states she can feel some numbness & tingling in whole arm, tips of fingers off & on denies any new sx of CVA since first one, has been taking Amlodipine qd needs lab work, advised to f/u 1 month w/physical, fasting labs

## 2023-01-03 NOTE — Assessment & Plan Note (Signed)
seen in ER recently feeling better, taking stool softenor daily and drinking more water advised on increasing fiber in diet, also Benefiber OTC prn increase exercise qd continue stool softener qd f/u prn

## 2023-01-03 NOTE — Progress Notes (Signed)
New Patient Office Visit  Subjective:  Patient ID: Rhonda Cortez, female    DOB: November 04, 1960  Age: 62 y.o. MRN: [id number]  CC:  Chief Complaint  Patient presents with   New Patient (Initial Visit)    Stroke in 2014, would like referrals. Moved here from [place].    Constipation    Seen in ED on 12/20/2022. Pt c/o fatigue and being uncomfortable in abdominal area. Never picked up colace.    Cough    Pt c/o cough with mucus for about a week.    Hypertension    BP has been high.     HPI Rhonda Cortez presents for establishing care today.  Hx of CVA: reports having in 2014. States she had stopped her Lisinopril d/t constant cough but her PCP would not change her med, so her BP got really high. Affected her right side, did rehab for 76mos, still can't write very well, states she can feel some numbness or tingling in whole arm.  Constipation/epigastric pain:  pt reports going to ER with stomach discomfort and was told per imaging that she was full of stool, given cocktail in ER and states she started feeling better, taking a stool softenor daily but still feels discomfort in epigastric area, with bloating, and belching, denies any nausea, bowels moving qd or qod and soft. Hypertension: Patient is currently maintained on the following medications for blood pressure: Amlodipine 10mg  qd, 1/2 pill bid Failed meds include: Lisinopril, dry cough Patient reports good compliance with blood pressure medications. Patient denies chest pain, headaches, shortness of breath or swelling. Last 3 blood pressure readings in our office are as follows: BP Readings from Last 3 Encounters:  01/03/23 (!) 152/86  12/20/22 (!) 162/104  04/17/20 (!) 155/95   Persistent cough:  has had a week, tried Mucinex and Coricidin OTC which hasn't helped. Productive, creamy in color, thick, denies SOB or sinus sx, no fever, feels fatigued. Did not test for covid at home.  Assessment & Plan:  Essential (primary)  hypertension Assessment & Plan: Chronic, hx of uncontrolled HTN - CVA in 2014 since then has been taking Amlodipine 10mg  qd reports taking HCTZ first, but then stopped, not sure why Lisinopril caused chronic cough BP high today, reports home readings b/w 130-140/70-80 adding HCTZ 12.5mg  qam, advised on use & SE f/u 1 month  Orders: -     hydroCHLOROthiazide; Take 1 tablet (12.5 mg total) by mouth in the morning.  Dispense: 30 tablet; Refill: 2  Screening for colon cancer -     Ambulatory referral to Gastroenterology  History of CVA (cerebrovascular accident) Assessment & Plan: 2014 had stopped her Lisinopril d/t constant cough but her PCP would not change her med, so BP got really high.  Affected her right side, did rehab for 6mos, still can't write very well, states she can feel some numbness & tingling in whole arm, tips of fingers off & on denies any new sx of CVA since first one, has been taking Amlodipine qd needs lab work, advised to f/u 1 month w/physical, fasting labs  Persistent cough - lungs clear, sending prednisone, advised on use & SE, try OTC generic Delsym or Robitussin syrup, cough lozenges, increase water intake.  -     predniSONE; Take 2 pills in the morning with breakfast for 3 days, then 1 pill for 2 days  Dispense: 8 tablet; Refill: 0  Constipation, unspecified constipation type Assessment & Plan: seen in ER recently feeling better, taking stool  softenor daily and drinking more water advised on increasing fiber in diet, also Benefiber OTC prn increase exercise qd continue stool softener qd f/u prn  Gastroesophageal reflux disease without esophagitis Assessment & Plan: New belching, bloating, epigastric pain advised on low acid diet, no lying down after meals pt hesitant to take meds f/u 1 month   Subjective:    Outpatient Medications Prior to Visit  Medication Sig Dispense Refill   amLODipine (NORVASC) 10 MG tablet Take 10 mg by mouth daily.      aspirin 325 MG tablet Take 325 mg by mouth daily.     cholecalciferol (VITAMIN D3) 25 MCG (1000 UNIT) tablet Take 1,000 Units by mouth daily.     docusate sodium (COLACE) 100 MG capsule Take 1 capsule (100 mg total) by mouth every 12 (twelve) hours. (Patient not taking: Reported on 01/03/2023) 60 capsule 0   No facility-administered medications prior to visit.   Past Medical History:  Diagnosis Date   Hypertension    Stroke [facility])    Past Surgical History:  Procedure Laterality Date   OOPHORECTOMY     TUBAL LIGATION      Objective:   Today's Vitals: BP (!) 152/86 (BP Location: Left Arm, Patient Position: Sitting, Cuff Size: Large)   Pulse 60   Temp (!) 97.5 F (36.4 C) (Temporal)   Ht 5' 3.5" (1.613 m)   Wt 139 lb 6 oz (63.2 kg)   SpO2 100%   BMI 24.30 kg/m   Physical Exam Vitals and nursing note reviewed.  Constitutional:      Appearance: Normal appearance.  Cardiovascular:     Rate and Rhythm: Normal rate and regular rhythm.  Pulmonary:     Effort: Pulmonary effort is normal.     Breath sounds: Normal breath sounds.  Musculoskeletal:        General: Normal range of motion.  Skin:    General: Skin is warm and dry.  Neurological:     Mental Status: She is alert.  Psychiatric:        Mood and Affect: Mood normal.        Behavior: Behavior normal.     Meds ordered this encounter  Medications   hydrochlorothiazide (HYDRODIURIL) 12.5 MG tablet    Sig: Take 1 tablet (12.5 mg total) by mouth in the morning.    Dispense:  30 tablet    Refill:  2    Order Specific Question:   Supervising Provider    Answer:   ANDY, CAMILLE L [[id number]]   predniSONE (DELTASONE) 20 MG tablet    Sig: Take 2 pills in the morning with breakfast for 3 days, then 1 pill for 2 days    Dispense:  8 tablet    Refill:  0    Order Specific Question:   Supervising Provider    Answer:   ANDY, CAMILLE L [[id number]]    Jeanie Sewer, NP

## 2023-01-03 NOTE — Assessment & Plan Note (Signed)
Chronic, hx of uncontrolled HTN - CVA in 2014 since then has been taking Amlodipine 10mg  qd reports taking HCTZ first, but then stopped, not sure why Lisinopril caused chronic cough BP high today, reports home readings b/w 130-140/70-80 adding HCTZ 12.5mg  qam, advised on use & SE f/u 1 month

## 2023-01-31 ENCOUNTER — Encounter: Payer: Self-pay | Admitting: Family

## 2023-01-31 ENCOUNTER — Ambulatory Visit (INDEPENDENT_AMBULATORY_CARE_PROVIDER_SITE_OTHER): Payer: BC Managed Care – PPO | Admitting: Family

## 2023-01-31 VITALS — BP 143/85 | HR 115 | Temp 97.5°F | Ht 63.5 in | Wt 139.1 lb

## 2023-01-31 DIAGNOSIS — Z136 Encounter for screening for cardiovascular disorders: Secondary | ICD-10-CM

## 2023-01-31 DIAGNOSIS — Z0000 Encounter for general adult medical examination without abnormal findings: Secondary | ICD-10-CM

## 2023-01-31 DIAGNOSIS — Z780 Asymptomatic menopausal state: Secondary | ICD-10-CM | POA: Diagnosis not present

## 2023-01-31 DIAGNOSIS — E782 Mixed hyperlipidemia: Secondary | ICD-10-CM | POA: Diagnosis not present

## 2023-01-31 DIAGNOSIS — I10 Essential (primary) hypertension: Secondary | ICD-10-CM | POA: Diagnosis not present

## 2023-01-31 LAB — CBC WITH DIFFERENTIAL/PLATELET
Basophils Absolute: 0.1 10*3/uL (ref 0.0–0.1)
Basophils Relative: 1.2 % (ref 0.0–3.0)
Eosinophils Absolute: 0.2 10*3/uL (ref 0.0–0.7)
Eosinophils Relative: 2 % (ref 0.0–5.0)
HCT: 44.4 % (ref 36.0–46.0)
Hemoglobin: 15.1 g/dL — ABNORMAL HIGH (ref 12.0–15.0)
Lymphocytes Relative: 48 % — ABNORMAL HIGH (ref 12.0–46.0)
Lymphs Abs: 3.9 10*3/uL (ref 0.7–4.0)
MCHC: 33.9 g/dL (ref 30.0–36.0)
MCV: 87.2 fl (ref 78.0–100.0)
Monocytes Absolute: 0.7 10*3/uL (ref 0.1–1.0)
Monocytes Relative: 8.1 % (ref 3.0–12.0)
Neutro Abs: 3.3 10*3/uL (ref 1.4–7.7)
Neutrophils Relative %: 40.7 % — ABNORMAL LOW (ref 43.0–77.0)
Platelets: 365 10*3/uL (ref 150.0–400.0)
RBC: 5.09 Mil/uL (ref 3.87–5.11)
RDW: 13.6 % (ref 11.5–15.5)
WBC: 8.2 10*3/uL (ref 4.0–10.5)

## 2023-01-31 LAB — COMPREHENSIVE METABOLIC PANEL
ALT: 21 U/L (ref 0–35)
AST: 22 U/L (ref 0–37)
Albumin: 3.6 g/dL (ref 3.5–5.2)
Alkaline Phosphatase: 101 U/L (ref 39–117)
BUN: 38 mg/dL — ABNORMAL HIGH (ref 6–23)
CO2: 29 mEq/L (ref 19–32)
Calcium: 9.3 mg/dL (ref 8.4–10.5)
Chloride: 99 mEq/L (ref 96–112)
Creatinine, Ser: 2.76 mg/dL — ABNORMAL HIGH (ref 0.40–1.20)
GFR: 18.09 mL/min — ABNORMAL LOW (ref >60.00)
Glucose, Bld: 115 mg/dL — ABNORMAL HIGH (ref 70–99)
Potassium: 4 mEq/L (ref 3.5–5.1)
Sodium: 139 mEq/L (ref 135–145)
Total Bilirubin: 0.4 mg/dL (ref 0.2–1.2)
Total Protein: 6.8 g/dL (ref 6.0–8.3)

## 2023-01-31 LAB — LIPID PANEL
Cholesterol: 392 mg/dL — ABNORMAL HIGH (ref 0–200)
HDL: 64.3 mg/dL (ref >39.00)
NonHDL: 327.29
Total CHOL/HDL Ratio: 6
Triglycerides: 202 mg/dL — ABNORMAL HIGH (ref 0.0–149.0)
VLDL: 40.4 mg/dL — ABNORMAL HIGH (ref 0.0–40.0)

## 2023-01-31 LAB — TSH: TSH: 2.19 u[IU]/mL (ref 0.35–5.50)

## 2023-01-31 LAB — LDL CHOLESTEROL, DIRECT: Direct LDL: 260 mg/dL

## 2023-01-31 NOTE — Patient Instructions (Addendum)
It was very nice to see you today!   I will review your lab results via MyChart in a few days.  For your leg cramps, drink more water! At least 2.5 liters per day = 10 cups Take Magnesium oxide, glycinate, or taurate at bedtime - up to  per day is ok, look for chelated form as this is better absorbed in the body. Ok to add electrolytes to your water, 1-2 times per day.  Look for generic Pepcid over the counter, and can take 10-20mg  after dinner to help with night time reflux symptoms.  Have a great weekend!    PLEASE NOTE:  If you had any lab tests please let us know if you have not heard back within a few days. You may see your results on MyChart before we have a chance to review them but we will give you a call once they are reviewed by [place]. If we ordered any referrals today, please let us know if you have not heard from their office within the next week.

## 2023-01-31 NOTE — Progress Notes (Signed)
Phone: [phone]  Subjective:  Patient 62 y.o. female presenting for annual physical.  Chief Complaint  Patient presents with   Annual Exam    Fasting w/ labs   Hypertension   HPI: Hypertension: Patient is currently maintained on the following medications for blood pressure: Amlodipine  qd, 1/2 pill bid, HCTZ 12.5 qam Failed meds include: Lisinopril, dry cough Patient reports good compliance with blood pressure medications. Patient denies chest pain, headaches, shortness of breath or swelling. Pt is reporting night time leg cramps.  See problem oriented charting- ROS- full  review of systems was completed and negative  except for: HTN noted in HPI above.  The following were reviewed and entered/updated in epic: Past Medical History:  Diagnosis Date   Hypertension    Stroke    Patient Active Problem List   Diagnosis Date Noted   Essential (primary) hypertension 01/03/2023   History of CVA (cerebrovascular accident) 01/03/2023   Gastroesophageal reflux disease without esophagitis 01/03/2023   Constipation 01/03/2023   Past Surgical History:  Procedure Laterality Date   OOPHORECTOMY     TUBAL LIGATION      Family History  Problem Relation Age of Onset   Diabetes Mother    Kidney disease Father    Hypertension Father    Hypertension Sister    Hypertension Brother    Asthma Son     Medications- reviewed and updated Current Outpatient Medications  Medication Sig Dispense Refill   amLODipine (NORVASC) 10 MG tablet Take 10 mg by mouth daily.     aspirin 325 MG tablet Take 325 mg by mouth daily.     cholecalciferol (VITAMIN D3) 25 MCG (1000 UNIT) tablet Take 1,000 Units by mouth daily.     docusate sodium (COLACE) 100 MG capsule Take 1 capsule (100 mg total) by mouth every 12 (twelve) hours. 60 capsule 0   hydrochlorothiazide (HYDRODIURIL) 12.5 MG tablet Take 1 tablet (12.5 mg total) by mouth in the morning. 30 tablet 2   No current facility-administered  medications for this visit.    Allergies-reviewed and updated No Known Allergies  Social History   Social History Narrative   Not on file    Objective:  BP (!) 143/85 (BP Location: Left Arm, Patient Position: Sitting, Cuff Size: Normal)   Pulse (!) 115   Temp (!) 97.5 F (36.4 C) (Temporal)   Ht 5' 3.5" (1.613 m)   Wt 139 lb 2 oz (63.1 kg)   SpO2 100%   BMI 24.26 kg/m  Physical Exam Vitals and nursing note reviewed.  Constitutional:      Appearance: Normal appearance.  HENT:     Head: Normocephalic.     Right Ear: Tympanic membrane normal.     Left Ear: Tympanic membrane normal.     Nose: Nose normal.     Mouth/Throat:     Mouth: Mucous membranes are moist.  Eyes:     Pupils: Pupils are equal, round, and reactive to light.  Cardiovascular:     Rate and Rhythm: Normal rate and regular rhythm.  Pulmonary:     Effort: Pulmonary effort is normal.     Breath sounds: Normal breath sounds.  Musculoskeletal:        General: Normal range of motion.     Cervical back: Normal range of motion.  Lymphadenopathy:     Cervical: No cervical adenopathy.  Skin:    General: Skin is warm and dry.  Neurological:     Mental Status: She is alert.  Psychiatric:  Mood and Affect: Mood normal.        Behavior: Behavior normal.     Assessment and Plan   Health Maintenance counseling: 1. Anticipatory guidance: Patient counseled regarding regular dental exams q6 months, eye exams yearly, avoiding smoking and second hand smoke, limiting alcohol to 2 beverages per day.   2. Risk factor reduction:  Advised patient of need for regular exercise and diet rich in fruits and vegetables to reduce risk of heart attack and stroke. Exercise- walking some, wants to get back into a gym.   Wt Readings from Last 3 Encounters:  01/31/23 139 lb 2 oz (63.1 kg)  01/03/23 139 lb 6 oz (63.2 kg)   3. Immunizations/screenings/ancillary studies Immunization History  Administered Date(s) Administered    Moderna Sars-Covid-2 Vaccination 01/06/2020, 02/08/2020   Health Maintenance Due  Topic Date Due   HIV Screening  Never done   Hepatitis C Screening  Never done   DTaP/Tdap/Td (1 - Tdap) Never done   PAP SMEAR-Modifier  Never done   MAMMOGRAM  Never done   Zoster Vaccines- Shingrix (1 of 2) Never done    5. Colon cancer screening: cologard last 2 years in a row, states her ins. covered  6. Skin cancer screening-  advised regular sunscreen use. Denies worrisome, changing, or new skin lesions.  7. Smoking associated screening (lung cancer screening, AAA screen 65-75, UA)- non- smoker 8. STD screening - N/A 9. Alcohol screening: rare  Annual physical exam -     CBC with Differential/Platelet -     Comprehensive metabolic panel -     Lipid panel -     TSH  Postmenopausal estrogen deficiency -     DG Bone Density; Future    Recommended follow up:  No follow-ups on file. Future Appointments  Date Time Provider Department Center  03/31/2023  7:30 AM Philip Aspen, Limmie Patricia, MD [id number]    Lab/Order associations: fasting    Dulce Sellar, NP

## 2023-02-03 ENCOUNTER — Ambulatory Visit (INDEPENDENT_AMBULATORY_CARE_PROVIDER_SITE_OTHER)
Admission: RE | Admit: 2023-02-03 | Discharge: 2023-02-03 | Disposition: A | Discharge status: Home / Self Care | Payer: BC Managed Care – PPO | Source: Ambulatory Visit | Attending: Family | Admitting: Family

## 2023-02-03 ENCOUNTER — Other Ambulatory Visit: Payer: Self-pay | Admitting: Family

## 2023-02-03 DIAGNOSIS — Z780 Asymptomatic menopausal state: Secondary | ICD-10-CM | POA: Diagnosis not present

## 2023-02-03 DIAGNOSIS — N189 Chronic kidney disease, unspecified: Secondary | ICD-10-CM

## 2023-02-03 MED ORDER — ROSUVASTATIN CALCIUM 5 MG PO TABS: 5.0000 mg | ORAL_TABLET | Freq: Every day | ORAL | 2 refills | Status: DC

## 2023-02-03 MED ORDER — VALSARTAN 80 MG PO TABS: 80.0000 mg | ORAL_TABLET | Freq: Every day | ORAL | 2 refills | Status: DC

## 2023-02-03 NOTE — Addendum Note (Signed)
Addended byDulce Sellar on: 02/03/2023 10:23 PM   Modules accepted: Orders

## 2023-02-03 NOTE — Progress Notes (Signed)
Please call pt regarding lab results & instructions.

## 2023-03-27 LAB — LAB REPORT - SCANNED
Albumin, Urine POC: 1876.7
Albumin/Creatinine Ratio, Urine, POC: 1278
Creatinine, POC: 146.8 mg/dL
EGFR: 20

## 2023-03-31 ENCOUNTER — Other Ambulatory Visit: Payer: Self-pay | Admitting: Nephrology

## 2023-03-31 ENCOUNTER — Ambulatory Visit: Payer: No Typology Code available for payment source | Admitting: Internal Medicine

## 2023-03-31 DIAGNOSIS — N179 Acute kidney failure, unspecified: Secondary | ICD-10-CM

## 2023-04-02 ENCOUNTER — Ambulatory Visit
Admission: RE | Admit: 2023-04-02 | Discharge: 2023-04-02 | Disposition: A | Discharge status: Home / Self Care | Payer: BC Managed Care – PPO | Source: Ambulatory Visit | Attending: Nephrology | Admitting: Nephrology

## 2023-04-02 DIAGNOSIS — N179 Acute kidney failure, unspecified: Secondary | ICD-10-CM

## 2023-05-09 LAB — LAB REPORT - SCANNED: EGFR: 23

## 2023-05-12 ENCOUNTER — Other Ambulatory Visit (HOSPITAL_COMMUNITY): Payer: Self-pay | Admitting: Nephrology

## 2023-05-12 DIAGNOSIS — R809 Proteinuria, unspecified: Secondary | ICD-10-CM

## 2023-05-22 ENCOUNTER — Other Ambulatory Visit: Payer: Self-pay | Admitting: Radiology

## 2023-05-22 DIAGNOSIS — R809 Proteinuria, unspecified: Secondary | ICD-10-CM

## 2023-05-23 ENCOUNTER — Other Ambulatory Visit: Payer: Self-pay

## 2023-05-23 ENCOUNTER — Ambulatory Visit (HOSPITAL_COMMUNITY)
Admission: RE | Admit: 2023-05-23 | Discharge: 2023-05-23 | Disposition: A | Discharge status: Home / Self Care | Payer: BC Managed Care – PPO | Source: Ambulatory Visit | Attending: Nephrology | Admitting: Nephrology

## 2023-05-23 ENCOUNTER — Encounter (HOSPITAL_COMMUNITY): Payer: Self-pay

## 2023-05-23 DIAGNOSIS — R809 Proteinuria, unspecified: Secondary | ICD-10-CM | POA: Diagnosis present

## 2023-05-23 DIAGNOSIS — I129 Hypertensive chronic kidney disease with stage 1 through stage 4 chronic kidney disease, or unspecified chronic kidney disease: Secondary | ICD-10-CM | POA: Insufficient documentation

## 2023-05-23 DIAGNOSIS — N189 Chronic kidney disease, unspecified: Secondary | ICD-10-CM | POA: Insufficient documentation

## 2023-05-23 LAB — GLUCOSE, CAPILLARY
Glucose-Capillary: 110 mg/dL — ABNORMAL HIGH (ref 70–99)
Glucose-Capillary: 144 mg/dL — ABNORMAL HIGH (ref 70–99)

## 2023-05-23 LAB — CBC
HCT: 41 % (ref 36.0–46.0)
Hemoglobin: 14.1 g/dL (ref 12.0–15.0)
MCH: 30.2 pg (ref 26.0–34.0)
MCHC: 34.4 g/dL (ref 30.0–36.0)
MCV: 87.8 fL (ref 80.0–100.0)
Platelets: 375 10*3/uL (ref 150–400)
RBC: 4.67 MIL/uL (ref 3.87–5.11)
RDW: 12.3 % (ref 11.5–15.5)
WBC: 11 10*3/uL — ABNORMAL HIGH (ref 4.0–10.5)
nRBC: 0 % (ref 0.0–0.2)

## 2023-05-23 LAB — PROTIME-INR
INR: 1 (ref 0.8–1.2)
Prothrombin Time: 12.9 seconds (ref 11.4–15.2)

## 2023-05-23 MED ORDER — GELATIN ABSORBABLE 12-7 MM EX MISC
1.0000 | Freq: Once | CUTANEOUS | Status: AC
  Administered 2023-05-23: 1 via TOPICAL

## 2023-05-23 MED ORDER — FENTANYL CITRATE (PF) 100 MCG/2ML IJ SOLN
INTRAMUSCULAR | Status: AC
  Filled 2023-05-23: qty 2

## 2023-05-23 MED ORDER — LIDOCAINE HCL (PF) 1 % IJ SOLN
10.0000 mL | Freq: Once | INTRAMUSCULAR | Status: AC
  Administered 2023-05-23: 10 mL via INTRADERMAL

## 2023-05-23 MED ORDER — FENTANYL CITRATE (PF) 100 MCG/2ML IJ SOLN
INTRAMUSCULAR | Status: AC | PRN
  Administered 2023-05-23 (×2): 25 ug via INTRAVENOUS

## 2023-05-23 MED ORDER — MIDAZOLAM HCL 2 MG/2ML IJ SOLN
INTRAMUSCULAR | Status: AC
  Filled 2023-05-23: qty 2

## 2023-05-23 MED ORDER — SODIUM CHLORIDE 0.9 % IV SOLN: INTRAVENOUS | Status: DC

## 2023-05-23 MED ORDER — MIDAZOLAM HCL 2 MG/2ML IJ SOLN
INTRAMUSCULAR | Status: AC | PRN
Start: 2023-05-23 — End: 2023-05-23
  Administered 2023-05-23 (×2): .5 mg via INTRAVENOUS

## 2023-05-23 NOTE — H&P (Signed)
Chief Complaint: Patient was seen in consultation today for random renal bx at the request of Singh,Vikas  Referring Physician(s): Singh,Vikas  Supervising Physician: Richarda Overlie  Patient Status: [facility] - Out-pt  History of Present Illness: Rhonda Cortez is a 62 y.o. female with PMHs of HTN, CVA in 2014, CKD and worsening RF who presents for random renal bx.   Patient has been followed by nephrology for CKD, she started to show decline in her RF. She also has been having proteinuria. US renal on 04/02/23 showed  increased cortical echogenicity compatible with chronic medical renal disease, no hydronephrosis.Random renal bx for further eval was recommended to the patient which she agreed to proceed.   Patient presents to [facility] IR for the random renal bx.   Patient laying in bed, not in acute distress.  [name] headache, fever, chills, shortness of breath, cough, chest pain, abdominal pain, nausea ,vomiting, and bleeding. She is nervous about the sedation, asks if the procedure can be performed with local only. She had hard time waking up from sedation after surgery in the past.  Informed that patient that if might be painful w/o sedation, but IR will team will try to minimize sedation.   Past Medical History:  Diagnosis Date   Hypertension    Stroke [facility])     Past Surgical History:  Procedure Laterality Date   OOPHORECTOMY     TUBAL LIGATION      Allergies: Patient has no known allergies.  Medications: Prior to Admission medications   Medication Sig Start Date End Date Taking? Authorizing Provider  amLODipine (NORVASC) 10 MG tablet Take 10 mg by mouth daily.    [provider]  aspirin 325 MG tablet Take 325 mg by mouth daily.    [provider]  cholecalciferol (VITAMIN D3) 25 MCG (1000 UNIT) tablet Take 5,000 Units by mouth daily.    [provider]  docusate sodium (COLACE) 100 MG capsule Take 1 capsule (100 mg total) by mouth every 12 (twelve)  hours. 12/20/22   Garrison, [place] N, FNP  rosuvastatin (CRESTOR) 5 MG tablet Take 1 tablet (5 mg total) by mouth daily. 02/03/23   Dulce Sellar, NP  valsartan (DIOVAN) 80 MG tablet Take 1 tablet (80 mg total) by mouth daily. 02/03/23   Dulce Sellar, NP     Family History  Problem Relation Age of Onset   Diabetes Mother    Kidney disease Father    Hypertension Father    Hypertension Sister    Hypertension Brother    Asthma Son     Social History   Socioeconomic History   Marital status: Married    Spouse name: Not on file   Number of children: 2   Years of education: Not on file   Highest education level: Not on file  Occupational History   Not on file  Tobacco Use   Smoking status: Never   Smokeless tobacco: Never  Vaping Use   Vaping status: Never Used  Substance and Sexual Activity   Alcohol use: No   Drug use: Never   Sexual activity: Not Currently    Birth control/protection: Surgical  Other Topics Concern   Not on file  Social History Narrative   Not on file   Social Determinants of Health   Financial Resource Strain: Not on file  Food Insecurity: Not on file  Transportation Needs: Not on file  Physical Activity: Not on file  Stress: Not on file  Social Connections: Not on  file     Review of Systems: A 12 point ROS discussed and pertinent positives are indicated in the HPI above.  All other systems are negative.  Vital Signs: There were no vitals taken for this visit.   Physical Exam Vitals and nursing note reviewed.  Constitutional:      General: Patient is not in acute distress.    Appearance: Normal appearance. Patient is not ill-appearing.  HENT:     Head: Normocephalic and atraumatic.     Mouth/Throat:     Mouth: Mucous membranes are moist.     Pharynx: Oropharynx is clear.  Cardiovascular:     Rate and Rhythm: Normal rate and regular rhythm.     Pulses: Normal pulses.     Heart sounds: Normal heart sounds.  Pulmonary:      Effort: Pulmonary effort is normal.     Breath sounds: Normal breath sounds.  Abdominal:     General: Abdomen is flat. Bowel sounds are normal.     Palpations: Abdomen is soft.  Musculoskeletal:     Cervical back: Neck supple.  Skin:    General: Skin is warm and dry.     Coloration: Skin is not jaundiced or pale.  Neurological:     Mental Status: Patient is alert and oriented to person, place, and time.  Psychiatric:        Mood and Affect: Mood normal.        Behavior: Behavior normal.        Judgment: Judgment normal.       Imaging: No results found.  Labs:  CBC: Recent Labs    12/20/22 1018 01/31/23 1019  WBC 12.6* 8.2  HGB 15.7* 15.1*  HCT 47.5* 44.4  PLT 390 365.0    COAGS: No results for input(s): "INR", "APTT" in the last 8760 hours.  BMP: Recent Labs    12/20/22 1018 01/31/23 1019  NA 139 139  K 4.4 4.0  CL 103 99  CO2 22 29  GLUCOSE 124* 115*  BUN 11 38*  CALCIUM 8.9 9.3  CREATININE 1.65* 2.76*  GFRNONAA 35*  --     LIVER FUNCTION TESTS: Recent Labs    01/31/23 1019  BILITOT 0.4  AST 22  ALT 21  ALKPHOS 101  PROT 6.8  ALBUMIN 3.6    TUMOR MARKERS: No results for input(s): "AFPTM", "CEA", "CA199", "CHROMGRNA" in the last 8760 hours.  Assessment and Plan: 62 y.o. female with CKD, proteinuria, worsening RF who presents for random renal bx.   NPO since MN VSS  CBC mild leukocytosis 11, hgn 141. Plt 375 INR 1.0  On ASA 325 mg every day. Patient states that the dose was reduced to 81 mg a month ago, and she has not taken ASA 81 mg 7 days.   Risks and benefits of random renal bx was discussed with the patient and/or patient's family including, but not limited to bleeding, infection, damage to adjacent structures or low yield requiring additional tests.  All of the questions were answered and there is agreement to proceed.  Consent signed and in chart.     Thank you for this interesting consult.  I greatly enjoyed meeting Rhonda  SRISTI Cortez and look forward to participating in their care.  A copy of this report was sent to the requesting provider on this date.  Electronically Signed: Willette Brace, PA-C 05/23/2023, 7:00 AM   I spent a total of  30 Minutes   in face to face in  clinical consultation, greater than 50% of which was counseling/coordinating care for random renal bx.   This chart was dictated using voice recognition software.  Despite best efforts to proofread,  errors can occur which can change the documentation meaning.

## 2023-05-23 NOTE — Procedures (Signed)
Interventional Radiology Procedure:   Indications: Proteinuria  Procedure: US guided left renal biopsy (random)  Findings: 2 core biopsies from left kidney lower pole.  Gelfoam slurry injected along biopsy tract.  No immediate bleeding.   Complications: None     EBL: Minimal  Plan: Strict bedrest 4 hours   R. Lowella Dandy, MD  Pager: [phone]

## 2023-05-27 ENCOUNTER — Encounter (HOSPITAL_COMMUNITY): Payer: Self-pay

## 2023-11-05 LAB — LAB REPORT - SCANNED
Albumin, Urine POC: 286.7
EGFR: 28
Microalb Creat Ratio: 328

## 2024-02-17 ENCOUNTER — Ambulatory Visit (INDEPENDENT_AMBULATORY_CARE_PROVIDER_SITE_OTHER): Payer: Self-pay | Admitting: Family

## 2024-02-17 ENCOUNTER — Encounter: Payer: Self-pay | Admitting: Family

## 2024-02-17 VITALS — BP 152/84 | HR 93 | Temp 97.5°F | Ht 63.0 in | Wt 145.2 lb

## 2024-02-17 DIAGNOSIS — R195 Other fecal abnormalities: Secondary | ICD-10-CM | POA: Diagnosis not present

## 2024-02-17 DIAGNOSIS — E782 Mixed hyperlipidemia: Secondary | ICD-10-CM | POA: Insufficient documentation

## 2024-02-17 DIAGNOSIS — Z0000 Encounter for general adult medical examination without abnormal findings: Secondary | ICD-10-CM

## 2024-02-17 DIAGNOSIS — Z8673 Personal history of transient ischemic attack (TIA), and cerebral infarction without residual deficits: Secondary | ICD-10-CM

## 2024-02-17 DIAGNOSIS — Z114 Encounter for screening for human immunodeficiency virus [HIV]: Secondary | ICD-10-CM

## 2024-02-17 DIAGNOSIS — Z1159 Encounter for screening for other viral diseases: Secondary | ICD-10-CM | POA: Diagnosis not present

## 2024-02-17 DIAGNOSIS — I10 Essential (primary) hypertension: Secondary | ICD-10-CM

## 2024-02-17 LAB — LIPID PANEL
Cholesterol: 242 mg/dL — ABNORMAL HIGH (ref 0–200)
HDL: 50.1 mg/dL (ref >39.00)
LDL Cholesterol: 154 mg/dL — ABNORMAL HIGH (ref 0–99)
NonHDL: 192.05
Total CHOL/HDL Ratio: 5
Triglycerides: 191 mg/dL — ABNORMAL HIGH (ref 0.0–149.0)
VLDL: 38.2 mg/dL (ref 0.0–40.0)

## 2024-02-17 NOTE — Patient Instructions (Addendum)
 It was very nice to see you today!   I will review your lab results via MyChart in a few days.  Very important to get your cholesterol under control to prevent another stroke or heart attack! I would like to try Livalo, low dose, starting slowly like we talked about. If this causes side effects, then we can try Zetia or the Repatha injections. We will wait and see how your numbers look.  Start exercising more! I like that you take the stairs every day at work, great job! But getting extra cardio exercise at least 4 days a week improves your heart function, helps lower blood pressure and makes you feel good! Less stress & better sleep.      PLEASE NOTE:  If you had any lab tests please let us  know if you have not heard back within a few days. You may see your results on MyChart before we have a chance to review them but we will give you a call once they are reviewed by us . If we ordered any referrals today, please let us  know if you have not heard from their office within the next week.

## 2024-02-17 NOTE — Progress Notes (Signed)
 Phone: [phone]  Subjective:  Patient 63 y.o. female presenting for annual physical.  Chief Complaint  Patient presents with   Annual Exam    Non Fasting w/ labs   Discussed the use of AI scribe software for clinical note transcription with the patient, who gave verbal consent to proceed.  History of Present Illness Rhonda Cortez is a 63 year old female with hypertension and hyperlipidemia who presents for annual physical.  She takes amlodipine and valsartan  for hypertension and states she adheres to her medication regimen. She has not been regularly monitoring her blood pressure but reports no issues with control.  She has hyperlipidemia and has not been taking rosuvastatin  due to muscle aches with previous cholesterol medications. She has not tried other statins recently.  She notes a weight gain of five pounds without recent changes in diet or activity. Her diet includes zucchini, cucumbers, beans, and whole grains, while avoiding white carbs and processed foods. She consumes ranch dressing and some sweets and limits fried foods and red meat.  She has a history of stroke and was on aspirin 325 mg since 2014 but recently stopped due to concerns about side effects, such as her fingers getting 'stuck' while doing her granddaughter's hair. This issue has not recurred since stopping aspirin.  She has a family history of kidney issues and is under the care of a kidney specialist. She is awaiting a new medication for her kidneys.  She is still working and active, taking the stairs to her third-floor office multiple times a day, and is considering joining a gym to increase physical activity.   See problem oriented charting- ROS- full  review of systems was completed and negative except for: HTN, hyperlipidemia noted in HPI above.  The following were reviewed and entered/updated in epic: Past Medical History:  Diagnosis Date   Hypertension    Stroke [facility])    Patient Active Problem  List   Diagnosis Date Noted   Essential (primary) hypertension 01/03/2023   History of CVA (cerebrovascular accident) 01/03/2023   Gastroesophageal reflux disease without esophagitis 01/03/2023   Constipation 01/03/2023   Past Surgical History:  Procedure Laterality Date   OOPHORECTOMY     TUBAL LIGATION      Family History  Problem Relation Age of Onset   Diabetes Mother    Kidney disease Father    Hypertension Father    Hypertension Sister    Hypertension Brother    Asthma Son     Medications- reviewed and updated Current Outpatient Medications  Medication Sig Dispense Refill   amLODipine (NORVASC) 10 MG tablet Take 10 mg by mouth daily.     cholecalciferol (VITAMIN D3) 25 MCG (1000 UNIT) tablet Take 5,000 Units by mouth daily.     valsartan  (DIOVAN ) 80 MG tablet Take 1 tablet (80 mg total) by mouth daily. 30 tablet 2   rosuvastatin  (CRESTOR ) 5 MG tablet Take 1 tablet (5 mg total) by mouth daily. (Patient not taking: Reported on 02/17/2024) 30 tablet 2   No current facility-administered medications for this visit.    Allergies-reviewed and updated No Known Allergies  Social History   Social History Narrative   Not on file    Objective:  BP (!) 152/84 (BP Location: Left Arm, Patient Position: Sitting, Cuff Size: Large)   Pulse 93   Temp (!) 97.5 F (36.4 C) (Temporal)   Ht 5\' 3"  (1.6 m)   Wt 145 lb 3.2 oz (65.9 kg)   SpO2 98%  BMI 25.72 kg/m  Physical Exam Vitals and nursing note reviewed.  Constitutional:      Appearance: Normal appearance.  HENT:     Head: Normocephalic.     Right Ear: Tympanic membrane normal.     Left Ear: Tympanic membrane normal.     Nose: Nose normal.     Mouth/Throat:     Mouth: Mucous membranes are moist.  Eyes:     Pupils: Pupils are equal, round, and reactive to light.  Cardiovascular:     Rate and Rhythm: Normal rate and regular rhythm.  Pulmonary:     Effort: Pulmonary effort is normal.     Breath sounds: Normal  breath sounds.  Musculoskeletal:        General: Normal range of motion.     Cervical back: Normal range of motion.  Lymphadenopathy:     Cervical: No cervical adenopathy.  Skin:    General: Skin is warm and dry.  Neurological:     Mental Status: She is alert.  Psychiatric:        Mood and Affect: Mood normal.        Behavior: Behavior normal.     Assessment and Plan   Health Maintenance counseling: 1. Anticipatory guidance: Patient counseled regarding regular dental exams q6 months, eye exams yearly, avoiding smoking and second hand smoke, limiting alcohol to 2 beverages per day.   2. Risk factor reduction:  Advised patient of need for regular exercise and diet rich in fruits and vegetables to reduce risk of heart attack and stroke. Exercise- walking some, wants to get back into a gym.   Wt Readings from Last 3 Encounters:  02/17/24 145 lb 3.2 oz (65.9 kg)  05/23/23 140 lb (63.5 kg)  01/31/23 139 lb 2 oz (63.1 kg)   3. Immunizations/screenings/ancillary studies Immunization History  Administered Date(s) Administered   Moderna Sars-Covid-2 Vaccination 01/06/2020, 02/08/2020   Health Maintenance Due  Topic Date Due   HIV Screening  Never done   Hepatitis C Screening  Never done    5. Colon cancer screening: positive cologuard in 2023, ordering colonoscopy referral today 6. Skin cancer screening-  advised regular sunscreen use. Denies worrisome, changing, or new skin lesions.  7. Smoking associated screening (lung cancer screening, AAA screen 65-75, UA)- non- smoker 8. STD screening - N/A 9. Alcohol screening: rare  Assessment & Plan Hypertension Managed with amlodipine and valsartan . Blood pressure monitoring irregular. BP high today. - Continue amlodipine and valsartan  as prescribed. Seen by nephrology last month and had CMP and CBC done, started on SGLT-2, but pt has not started yet. - Encourage regular blood pressure monitoring, notify office of persistent readings  >140/90.  - F/U in 6 mos or prn  Chronic kidney disease SGLT2 inhibitor initiation recommended to protect kidney function. Advised pt on benefits and possible SE. - Start SGLT2 inhibitor (Jardiance or [place]) as prescribed by nephrologist. - Encourage use of online coupons for SGLT2 inhibitor if cost is a concern.  History of Stroke Aspirin therapy essential for prevention. Recommend switching to 81 mg due to side effects from 325 mg. Discussed importance of cholesterol control in light of CVA hx. - Switch to 81 mg aspirin daily.  Hyperlipidemia Unmanaged due to statin-induced myalgia, pt never started Crestor . High cholesterol levels increase cardiovascular risk, pt w/hx of CVA. Discussed in detail how uncontrolled cholesterol contributes to plaque build up in arteries, and risk of clotting. Advised on dietary changes and alternative medications. - Order lipid panel. - Discuss  results and potential medication options after lipid panel results are available. - Encourage dietary modifications to reduce cholesterol intake and increase cardio exercise 5-6 days per week. - Consider alternative medications such as pravastatin, Livalo, or Repatha if statins are not tolerated. - F/U in 3-52mos   General Health Maintenance Routine screenings and preventive measures planned. - Perform hepatitis C and HIV screenings. - Arrange referral for colonoscopy.   Recommended follow up:  Return in about 6 months (around 08/19/2024) for HTN, hyperlipidemia, med refills. No future appointments.   Lab/Order associations:  not-fasting    Versa Gore, NP

## 2024-02-17 NOTE — Assessment & Plan Note (Signed)
 Unmanaged due to statin-induced myalgia, pt never started Crestor . High cholesterol levels increase cardiovascular risk, pt w/hx of CVA. Discussed in detail how uncontrolled cholesterol contributes to plaque build up in arteries, and risk of clotting. Advised on dietary changes and alternative medications. - Order lipid panel. - Discuss results and potential medication options after lipid panel results are available. - Encourage dietary modifications to reduce cholesterol intake and increase cardio exercise 5-6 days per week. - Consider alternative medications such as pravastatin, Livalo, or Repatha if statins are not tolerated. - F/U in 3-77mos

## 2024-02-17 NOTE — Assessment & Plan Note (Signed)
 Aspirin therapy essential for prevention. Recommend switching to 81 mg due to side effects from 325 mg. Discussed importance of cholesterol control in light of CVA hx. - Switch to 81 mg aspirin daily.

## 2024-02-17 NOTE — Assessment & Plan Note (Signed)
 Managed with amlodipine and valsartan . Blood pressure monitoring irregular. BP high today. - Continue amlodipine and valsartan  as prescribed. Seen by nephrology last month and had CMP and CBC done, started on SGLT-2, but pt has not started yet. - Encourage regular blood pressure monitoring, notify office of persistent readings >140/90.  - F/U in 6 mos or prn

## 2024-02-18 LAB — HEPATITIS C ANTIBODY: Hepatitis C Ab: NONREACTIVE

## 2024-02-18 LAB — HIV ANTIBODY (ROUTINE TESTING W REFLEX): HIV 1&2 Ab, 4th Generation: NONREACTIVE

## 2024-02-19 ENCOUNTER — Encounter: Payer: Self-pay | Admitting: Family

## 2024-02-19 DIAGNOSIS — E782 Mixed hyperlipidemia: Secondary | ICD-10-CM

## 2024-03-09 MED ORDER — PITAVASTATIN CALCIUM 1 MG PO TABS
1.0000 mg | ORAL_TABLET | Freq: Every day | ORAL | 0 refills | Status: DC
Start: 2024-03-09 — End: 2024-09-02

## 2024-04-05 ENCOUNTER — Ambulatory Visit: Payer: Self-pay

## 2024-04-05 NOTE — Telephone Encounter (Signed)
 FYI Only or Action Required?: Action required by provider: medication refill request.  Patient was last seen in primary care on 02/17/2024 by Lucius Krabbe, NP. Called Nurse Triage reporting Skin Itching. Symptoms began several days ago. Interventions attempted: Nothing. Symptoms are: stable.  Triage Disposition: Home Care- See note  Patient/caregiver understands and will follow disposition?: Yes       Copied from CRM [id number]. Topic: Clinical - Medication Question >> Apr 05, 2024 11:32 AM Lavanda D wrote: Reason for CRM: Patient is requesting trimincilone for skin itching, said that she had previously got this medication but it is not on the medication list. At [facility] in [facility]. Reason for Disposition  Mild localized itching  Answer Assessment - Initial Assessment Questions Additional Information:  Patient was prescribed Triamcinolone from a historical provider, for her eczema.   Patient hasn't used it in a long time, as she hasn't had a flare up.  Patient is now requesting a new prescription   --------------------------------------------------------------------------------------  1. DESCRIPTION: Describe the itching you are having. Where is it located?     --- Neck ( posterior) ----- Flat, red, pin point sized. As wide as a quarter.     2. SEVERITY: How bad is it?    - MILD: Doesn't interfere with normal activities.   - MODERATE-SEVERE: Interferes with work, school, sleep, or other activities.      ----------------Mild    3. SCRATCHING: Are there any scratch marks? Bleeding?     ------------- Yes, no stretch marks or bleeding.   4. ONSET: When did the itching begin?      ---- A couple weeks ago    5. CAUSE: What do you think is causing the itching?      ----- Was diagnosed with eczema    6. OTHER SYMPTOMS: Do you have any other symptoms?      ------------- Denies  Protocols used: Itching - Localized-A-AH

## 2024-04-05 NOTE — Telephone Encounter (Signed)
 Please see pt request for Rx cream previously prescribed by another provider; pt having eczema flare up; please advise if ok to send

## 2024-04-07 NOTE — Telephone Encounter (Signed)
 yes, ok to send Triamcinolone 0.1% cream bid, 30g, 2 refills, thx

## 2024-04-08 ENCOUNTER — Other Ambulatory Visit: Payer: Self-pay

## 2024-04-08 MED ORDER — TRIAMCINOLONE ACETONIDE 0.1 % EX OINT: TOPICAL_OINTMENT | Freq: Two times a day (BID) | CUTANEOUS | 2 refills | Status: DC

## 2024-04-08 NOTE — Telephone Encounter (Signed)
 Rx sent to pharmacy

## 2024-04-22 ENCOUNTER — Encounter: Payer: Self-pay | Admitting: Family

## 2024-05-10 ENCOUNTER — Other Ambulatory Visit: Payer: Self-pay | Admitting: Family

## 2024-05-10 MED ORDER — TRIAMCINOLONE ACETONIDE 0.1 % EX OINT: TOPICAL_OINTMENT | Freq: Two times a day (BID) | CUTANEOUS | 2 refills | Status: DC

## 2024-05-10 NOTE — Telephone Encounter (Signed)
 Copied from CRM [id number]. Topic: Clinical - Medication Refill >> May 10, 2024  1:45 PM Leah C wrote: Medication: triamcinolone  0.1% oint-Eucerin equivalent cream 1:1 mixture  Has the patient contacted their pharmacy? Yes (Agent: If no, request that the patient contact the pharmacy for the refill. If patient does not wish to contact the pharmacy document the reason why and proceed with request.) (Agent: If yes, when and what did the pharmacy advise?)  This is the patient's preferred pharmacy:  [id number] - [address], [place] - [address] AT [facility] [address] [place] [address] Phone: [phone] Fax: [phone]   Is this the correct pharmacy for this prescription? Yes If no, delete pharmacy and type the correct one.   Has the prescription been filled recently? Yes  Is the patient out of the medication? Yes  Has the patient been seen for an appointment in the last year OR does the patient have an upcoming appointment? Yes  Can we respond through MyChart? Yes  Agent: Please be advised that Rx refills may take up to 3 business days. We ask that you follow-up with your pharmacy.

## 2024-05-12 LAB — LAB REPORT - SCANNED
Creatinine, POC: 140.2 mg/dL
EGFR: 33
PTH: 33
PTH: 33

## 2024-05-18 ENCOUNTER — Telehealth: Payer: Self-pay | Admitting: Family

## 2024-05-18 NOTE — Telephone Encounter (Signed)
 Called and spoke with patient regarding getting scheduled for a screening mammogram. Patient is going to call me back with her last mammo info due to her not remembering when she had it done. Last mammo was completed in [place] last year. I will help her get scheduled when she calls back.

## 2024-08-27 ENCOUNTER — Emergency Department (HOSPITAL_COMMUNITY)

## 2024-08-27 ENCOUNTER — Other Ambulatory Visit: Payer: Self-pay

## 2024-08-27 ENCOUNTER — Encounter (HOSPITAL_COMMUNITY): Payer: Self-pay

## 2024-08-27 ENCOUNTER — Inpatient Hospital Stay (HOSPITAL_COMMUNITY)
Admission: EM | Admit: 2024-08-27 | Discharge: 2024-09-02 | DRG: 065 | Disposition: A | Discharge status: Rehabilitation Facility | Attending: Emergency Medicine | Admitting: Emergency Medicine

## 2024-08-27 DIAGNOSIS — Z741 Need for assistance with personal care: Secondary | ICD-10-CM | POA: Diagnosis present

## 2024-08-27 DIAGNOSIS — R5381 Other malaise: Secondary | ICD-10-CM | POA: Diagnosis present

## 2024-08-27 DIAGNOSIS — Z833 Family history of diabetes mellitus: Secondary | ICD-10-CM

## 2024-08-27 DIAGNOSIS — Z825 Family history of asthma and other chronic lower respiratory diseases: Secondary | ICD-10-CM

## 2024-08-27 DIAGNOSIS — N182 Chronic kidney disease, stage 2 (mild): Secondary | ICD-10-CM

## 2024-08-27 DIAGNOSIS — Z8673 Personal history of transient ischemic attack (TIA), and cerebral infarction without residual deficits: Secondary | ICD-10-CM | POA: Diagnosis not present

## 2024-08-27 DIAGNOSIS — I651 Occlusion and stenosis of basilar artery: Secondary | ICD-10-CM | POA: Diagnosis present

## 2024-08-27 DIAGNOSIS — E785 Hyperlipidemia, unspecified: Secondary | ICD-10-CM | POA: Diagnosis present

## 2024-08-27 DIAGNOSIS — K5901 Slow transit constipation: Secondary | ICD-10-CM | POA: Diagnosis not present

## 2024-08-27 DIAGNOSIS — Z8419 Family history of other disorders of kidney and ureter: Secondary | ICD-10-CM | POA: Diagnosis not present

## 2024-08-27 DIAGNOSIS — I10 Essential (primary) hypertension: Secondary | ICD-10-CM | POA: Diagnosis not present

## 2024-08-27 DIAGNOSIS — I69198 Other sequelae of nontraumatic intracerebral hemorrhage: Secondary | ICD-10-CM | POA: Diagnosis not present

## 2024-08-27 DIAGNOSIS — N179 Acute kidney failure, unspecified: Secondary | ICD-10-CM | POA: Diagnosis present

## 2024-08-27 DIAGNOSIS — K219 Gastro-esophageal reflux disease without esophagitis: Secondary | ICD-10-CM | POA: Diagnosis present

## 2024-08-27 DIAGNOSIS — R29702 NIHSS score 2: Secondary | ICD-10-CM | POA: Diagnosis not present

## 2024-08-27 DIAGNOSIS — N1831 Chronic kidney disease, stage 3a: Secondary | ICD-10-CM | POA: Diagnosis present

## 2024-08-27 DIAGNOSIS — I610 Nontraumatic intracerebral hemorrhage in hemisphere, subcortical: Secondary | ICD-10-CM | POA: Diagnosis not present

## 2024-08-27 DIAGNOSIS — I63532 Cerebral infarction due to unspecified occlusion or stenosis of left posterior cerebral artery: Secondary | ICD-10-CM | POA: Diagnosis not present

## 2024-08-27 DIAGNOSIS — I129 Hypertensive chronic kidney disease with stage 1 through stage 4 chronic kidney disease, or unspecified chronic kidney disease: Secondary | ICD-10-CM

## 2024-08-27 DIAGNOSIS — G4089 Other seizures: Secondary | ICD-10-CM | POA: Diagnosis not present

## 2024-08-27 DIAGNOSIS — I951 Orthostatic hypotension: Secondary | ICD-10-CM | POA: Diagnosis not present

## 2024-08-27 DIAGNOSIS — F09 Unspecified mental disorder due to known physiological condition: Secondary | ICD-10-CM | POA: Diagnosis not present

## 2024-08-27 DIAGNOSIS — R509 Fever, unspecified: Secondary | ICD-10-CM | POA: Diagnosis not present

## 2024-08-27 DIAGNOSIS — K5900 Constipation, unspecified: Secondary | ICD-10-CM | POA: Diagnosis present

## 2024-08-27 DIAGNOSIS — E782 Mixed hyperlipidemia: Secondary | ICD-10-CM | POA: Diagnosis present

## 2024-08-27 DIAGNOSIS — N1832 Chronic kidney disease, stage 3b: Secondary | ICD-10-CM | POA: Diagnosis not present

## 2024-08-27 DIAGNOSIS — R29701 NIHSS score 1: Secondary | ICD-10-CM | POA: Diagnosis present

## 2024-08-27 DIAGNOSIS — D72829 Elevated white blood cell count, unspecified: Secondary | ICD-10-CM | POA: Diagnosis not present

## 2024-08-27 DIAGNOSIS — R569 Unspecified convulsions: Secondary | ICD-10-CM | POA: Diagnosis not present

## 2024-08-27 DIAGNOSIS — Z79899 Other long term (current) drug therapy: Secondary | ICD-10-CM

## 2024-08-27 DIAGNOSIS — I639 Cerebral infarction, unspecified: Secondary | ICD-10-CM | POA: Diagnosis not present

## 2024-08-27 DIAGNOSIS — A499 Bacterial infection, unspecified: Secondary | ICD-10-CM | POA: Diagnosis not present

## 2024-08-27 DIAGNOSIS — I615 Nontraumatic intracerebral hemorrhage, intraventricular: Principal | ICD-10-CM | POA: Diagnosis present

## 2024-08-27 DIAGNOSIS — Z8249 Family history of ischemic heart disease and other diseases of the circulatory system: Secondary | ICD-10-CM

## 2024-08-27 DIAGNOSIS — N390 Urinary tract infection, site not specified: Secondary | ICD-10-CM | POA: Diagnosis not present

## 2024-08-27 DIAGNOSIS — I618 Other nontraumatic intracerebral hemorrhage: Secondary | ICD-10-CM | POA: Diagnosis not present

## 2024-08-27 DIAGNOSIS — R531 Weakness: Secondary | ICD-10-CM | POA: Diagnosis not present

## 2024-08-27 DIAGNOSIS — I619 Nontraumatic intracerebral hemorrhage, unspecified: Secondary | ICD-10-CM | POA: Diagnosis not present

## 2024-08-27 DIAGNOSIS — I69120 Aphasia following nontraumatic intracerebral hemorrhage: Secondary | ICD-10-CM | POA: Diagnosis not present

## 2024-08-27 DIAGNOSIS — N189 Chronic kidney disease, unspecified: Secondary | ICD-10-CM | POA: Diagnosis not present

## 2024-08-27 LAB — COMPREHENSIVE METABOLIC PANEL WITH GFR
ALT: 17 U/L (ref 0–44)
AST: 24 U/L (ref 15–41)
Albumin: 4.5 g/dL (ref 3.5–5.0)
Alkaline Phosphatase: 69 U/L (ref 38–126)
Anion gap: 16 — ABNORMAL HIGH (ref 5–15)
BUN: 29 mg/dL — ABNORMAL HIGH (ref 8–23)
CO2: 21 mmol/L — ABNORMAL LOW (ref 22–32)
Calcium: 10 mg/dL (ref 8.9–10.3)
Chloride: 107 mmol/L (ref 98–111)
Creatinine, Ser: 1.97 mg/dL — ABNORMAL HIGH (ref 0.44–1.00)
GFR, Estimated: 28 mL/min — ABNORMAL LOW (ref >60)
Glucose, Bld: 131 mg/dL — ABNORMAL HIGH (ref 70–99)
Potassium: 3.9 mmol/L (ref 3.5–5.1)
Sodium: 144 mmol/L (ref 135–145)
Total Bilirubin: 0.8 mg/dL (ref 0.0–1.2)
Total Protein: 8.2 g/dL — ABNORMAL HIGH (ref 6.5–8.1)

## 2024-08-27 LAB — CBC
HCT: 45 % (ref 36.0–46.0)
Hemoglobin: 15.4 g/dL — ABNORMAL HIGH (ref 12.0–15.0)
MCH: 29.7 pg (ref 26.0–34.0)
MCHC: 34.2 g/dL (ref 30.0–36.0)
MCV: 86.9 fL (ref 80.0–100.0)
Platelets: 369 K/uL (ref 150–400)
RBC: 5.18 MIL/uL — ABNORMAL HIGH (ref 3.87–5.11)
RDW: 12.8 % (ref 11.5–15.5)
WBC: 10.4 K/uL (ref 4.0–10.5)
nRBC: 0 % (ref 0.0–0.2)

## 2024-08-27 LAB — URINALYSIS, ROUTINE W REFLEX MICROSCOPIC
Bilirubin Urine: NEGATIVE
Glucose, UA: NEGATIVE mg/dL
Ketones, ur: NEGATIVE mg/dL
Nitrite: NEGATIVE
Protein, ur: 100 mg/dL — AB
Specific Gravity, Urine: 1.025 (ref 1.005–1.030)
pH: 6 (ref 5.0–8.0)

## 2024-08-27 LAB — DIFFERENTIAL
Abs Immature Granulocytes: 0.03 K/uL (ref 0.00–0.07)
Basophils Absolute: 0 K/uL (ref 0.0–0.1)
Basophils Relative: 0 %
Eosinophils Absolute: 0 K/uL (ref 0.0–0.5)
Eosinophils Relative: 0 %
Immature Granulocytes: 0 %
Lymphocytes Relative: 16 %
Lymphs Abs: 1.6 K/uL (ref 0.7–4.0)
Monocytes Absolute: 0.5 K/uL (ref 0.1–1.0)
Monocytes Relative: 5 %
Neutro Abs: 8.2 K/uL — ABNORMAL HIGH (ref 1.7–7.7)
Neutrophils Relative %: 79 %

## 2024-08-27 LAB — PROTIME-INR
INR: 1 (ref 0.8–1.2)
Prothrombin Time: 13.4 s (ref 11.4–15.2)

## 2024-08-27 LAB — CBG MONITORING, ED: Glucose-Capillary: 135 mg/dL — ABNORMAL HIGH (ref 70–99)

## 2024-08-27 LAB — URINALYSIS, MICROSCOPIC (REFLEX)

## 2024-08-27 LAB — RAPID URINE DRUG SCREEN, HOSP PERFORMED
Amphetamines: NOT DETECTED
Barbiturates: NOT DETECTED
Benzodiazepines: NOT DETECTED
Cocaine: NOT DETECTED
Opiates: NOT DETECTED
Tetrahydrocannabinol: NOT DETECTED

## 2024-08-27 LAB — ETHANOL: Alcohol, Ethyl (B): <15 mg/dL (ref <15)

## 2024-08-27 LAB — I-STAT CHEM 8, ED
BUN: 33 mg/dL — ABNORMAL HIGH (ref 8–23)
Calcium, Ion: 1.23 mmol/L (ref 1.15–1.40)
Chloride: 110 mmol/L (ref 98–111)
Creatinine, Ser: 1.9 mg/dL — ABNORMAL HIGH (ref 0.44–1.00)
Glucose, Bld: 138 mg/dL — ABNORMAL HIGH (ref 70–99)
HCT: 46 % (ref 36.0–46.0)
Hemoglobin: 15.6 g/dL — ABNORMAL HIGH (ref 12.0–15.0)
Potassium: 4.2 mmol/L (ref 3.5–5.1)
Sodium: 144 mmol/L (ref 135–145)
TCO2: 22 mmol/L (ref 22–32)

## 2024-08-27 LAB — MRSA NEXT GEN BY PCR, NASAL: MRSA by PCR Next Gen: NOT DETECTED

## 2024-08-27 LAB — APTT: aPTT: 29 s (ref 24–36)

## 2024-08-27 MED ORDER — PANTOPRAZOLE SODIUM 40 MG IV SOLR
40.0000 mg | Freq: Every day | INTRAVENOUS | Status: DC
  Administered 2024-08-27: 40 mg via INTRAVENOUS
  Filled 2024-08-27: qty 10

## 2024-08-27 MED ORDER — SENNOSIDES-DOCUSATE SODIUM 8.6-50 MG PO TABS
1.0000 | ORAL_TABLET | Freq: Two times a day (BID) | ORAL | Status: DC
  Administered 2024-08-27 – 2024-09-02 (×10): 1 via ORAL
  Filled 2024-08-27 (×11): qty 1

## 2024-08-27 MED ORDER — ACETAMINOPHEN 650 MG RE SUPP: 650.0000 mg | RECTAL | Status: DC | PRN

## 2024-08-27 MED ORDER — ORAL CARE MOUTH RINSE: 15.0000 mL | OROMUCOSAL | Status: DC | PRN

## 2024-08-27 MED ORDER — STROKE: EARLY STAGES OF RECOVERY BOOK
Freq: Once | Status: AC
  Filled 2024-08-27: qty 1

## 2024-08-27 MED ORDER — AMLODIPINE BESYLATE 5 MG PO TABS
10.0000 mg | ORAL_TABLET | Freq: Every day | ORAL | Status: DC
  Administered 2024-08-27 – 2024-09-02 (×7): 10 mg via ORAL
  Filled 2024-08-27 (×2): qty 1
  Filled 2024-08-27 (×4): qty 2
  Filled 2024-08-27: qty 1

## 2024-08-27 MED ORDER — ACETAMINOPHEN 325 MG PO TABS
650.0000 mg | ORAL_TABLET | ORAL | Status: DC | PRN
  Administered 2024-08-27 – 2024-09-01 (×8): 650 mg via ORAL
  Filled 2024-08-27 (×8): qty 2

## 2024-08-27 MED ORDER — CHLORHEXIDINE GLUCONATE CLOTH 2 % EX PADS
6.0000 | MEDICATED_PAD | Freq: Every day | CUTANEOUS | Status: DC
  Administered 2024-08-27 – 2024-08-28 (×2): 6 via TOPICAL

## 2024-08-27 MED ORDER — ACETAMINOPHEN 160 MG/5ML PO SOLN: 650.0000 mg | ORAL | Status: DC | PRN

## 2024-08-27 MED ORDER — LABETALOL HCL 5 MG/ML IV SOLN: 10.0000 mg | INTRAVENOUS | Status: DC | PRN

## 2024-08-27 NOTE — ED Notes (Addendum)
 SABRA

## 2024-08-27 NOTE — ED Triage Notes (Signed)
 Pt family reports pt was missing from 1800-1900 last night to 0330 this morning. Per family pt has not memory of where she was.

## 2024-08-27 NOTE — ED Notes (Signed)
 Denies numbness/tingling/blurred vision. C/O slight headache

## 2024-08-27 NOTE — Progress Notes (Signed)
 Patient ID: Rhonda Cortez, female   DOB: 1961/05/09, 63 y.o.   MRN: [id number] I was called by the ED physician to look at the CT scan on the 63 year old female who developed cognitive problems last night.  She has trouble with memory.  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  There is no hydrocephalus.  I suspect since the ventricles are not casted, she will not develop hydrocephalus but she certainly could.  She has been admitted to neurology to the ICU.  She will have follow-up CT and neurologic exams.  Should she develop hydrocephalus she would be a candidate for an EVD.  Please call us  if we can be of any assistance.

## 2024-08-27 NOTE — ED Provider Triage Note (Signed)
 Emergency Medicine Provider Triage Evaluation Note  Rhonda Cortez , a 63 y.o. female  was evaluated in triage.  Pt complains of forgetfullness. Pt was following her friend in her car yesterday afternoon but then they lost touch with her around 3pm. They finally found her on the side of a street around 3:30am today.  Pt was unable to recall what had transpire.  Pt did report mild headache yesterday morning.  She denies fever, chills, cold sxs, cp, sob, focal numbness, focal weakness.    Review of Systems  Positive: As above Negative: As above  Physical Exam  BP (!) 156/85 (BP Location: Right Arm)   Pulse (!) 124   Temp 97.8 F (36.6 C) (Oral)   Resp 15   Ht 5' 3 (1.6 m)   Wt 54.4 kg   SpO2 100%   BMI 21.26 kg/m  Gen:   Awake, no distress   Resp:  Normal effort  MSK:   Moves extremities without difficulty  Other:  Alert and oriented x 2  Medical Decision Making  Medically screening exam initiated at 12:18 PM.  Appropriate orders placed.  Jacci P Birchler was informed that the remainder of the evaluation will be completed by another provider, this initial triage assessment does not replace that evaluation, and the importance of remaining in the ED until their evaluation is complete.     Nivia Colon, PA-C 08/29/24 1012

## 2024-08-27 NOTE — H&P (Addendum)
 NEUROLOGY H&P NOTE   Date of service: August 27, 2024 Patient Name: Rhonda Cortez MRN:  [id number] DOB:  10-07-1961 Chief Complaint: Confusion  History of Present Illness  Rhonda Cortez is a 63 y.o. female with hx of hypertension and previous stroke who first noticed that she had some confusion yesterday evening.  Over the course of the day, she noticed difficulty with word finding, and felt like she was missing some time.  Due to this she sought care in the emergency department, and was noticed that she displayed some signs of confusion.  A head CT was therefore performed which demonstrates a caudate head hemorrhage with intraventricular extension.  Neurology was therefore consulted for further management.   Last known well: 11/13, evening ICH Score: 1 tNKASE: Not offered due to ICH Thrombectomy: not offered due to ICH NIHSS components Score: Comment  1a Level of Conscious 0[]  1[]  2[]  3[]      1b LOC Questions 0[]  1[]  2[]       1c LOC Commands 0[]  1[]  2[]       2 Best Gaze 0[]  1[]  2[]       3 Visual 0[]  1[]  2[]  3[]      4 Facial Palsy 0[]  1[]  2[]  3[]      5a Motor Arm - left 0[]  1[]  2[]  3[]  4[]  UN[]    5b Motor Arm - Right 0[]  1[]  2[]  3[]  4[]  UN[]    6a Motor Leg - Left 0[]  1[]  2[]  3[]  4[]  UN[]    6b Motor Leg - Right 0[]  1[]  2[]  3[]  4[]  UN[]    7 Limb Ataxia 0[]  1[]  2[]  UN[]      8 Sensory 0[]  1[]  2[]  UN[]      9 Best Language 0[]  1[x]  2[]  3[]      10 Dysarthria 0[]  1[]  2[]  UN[]      11 Extinct. and Inattention 0[]  1[]  2[]       TOTAL: 1       Past History   Past Medical History:  Diagnosis Date   Hypertension    Stroke [facility])    Past Surgical History:  Procedure Laterality Date   OOPHORECTOMY     TUBAL LIGATION     Family History  Problem Relation Age of Onset   Diabetes Mother    Kidney disease Father    Hypertension Father    Hypertension Sister    Hypertension Brother    Asthma Son    Social History   Socioeconomic History   Marital status: Married    Spouse name: Not  on file   Number of children: 2   Years of education: Not on file   Highest education level: GED or equivalent  Occupational History   Not on file  Tobacco Use   Smoking status: Never   Smokeless tobacco: Never  Vaping Use   Vaping status: Never Used  Substance and Sexual Activity   Alcohol use: No   Drug use: Never   Sexual activity: Not Currently    Birth control/protection: Surgical  Other Topics Concern   Not on file  Social History Narrative   Not on file   Social Drivers of Health   Financial Resource Strain: Low Risk  (02/17/2024)   Overall Financial Resource Strain (CARDIA)    Difficulty of Paying Living Expenses: Not hard at all  Food Insecurity: No Food Insecurity (02/17/2024)   Hunger Vital Sign    Worried About Running Out of Food in the Last Year: Never true    Ran Out of Food in the Last Year:  Never true  Transportation Needs: No Transportation Needs (02/17/2024)   PRAPARE - Administrator, Civil Service (Medical): No    Lack of Transportation (Non-Medical): No  Physical Activity: Insufficiently Active (02/17/2024)   Exercise Vital Sign    Days of Exercise per Week: 5 days    Minutes of Exercise per Session: 20 min  Stress: No Stress Concern Present (02/17/2024)   [employer] of Occupational Health - Occupational Stress Questionnaire    Feeling of Stress : Not at all  Social Connections: Socially Isolated (02/17/2024)   Social Connection and Isolation Panel    Frequency of Communication with Friends and Family: Twice a week    Frequency of Social Gatherings with Friends and Family: Never    Attends Religious Services: More than 4 times per year    Active Member of Clubs or Organizations: No    Attends Engineer, Structural: Not on file    Marital Status: Never married   No Known Allergies  Medications  (Not in a hospital admission)    Vitals   Vitals:   08/27/24 1136 08/27/24 1205 08/27/24 1445  BP: (!) 156/85  (!) 137/94  Pulse:  (!) 124  (!) 104  Resp: 15  (!) 21  Temp: 97.8 F (36.6 C)    TempSrc: Oral    SpO2: 100%  100%  Weight:  54.4 kg   Height:  5' 3 (1.6 m)      Body mass index is 21.26 kg/m.  Physical Exam   Constitutional: Appears well-developed and well-nourished.  Psych: Affect appropriate to situation.  Eyes: No scleral injection.  HENT: No OP obstruction.  Head: Normocephalic.  Cardiovascular: Normal rate and regular rhythm.  Respiratory: Effort normal, non-labored breathing.  GI: Soft.  No distension. There is no tenderness.  Skin: WDI.   Neurologic Examination   Neuro: Mental Status: Patient is awake, alert, oriented to person, place, month, year, and situation. Patient is able to give a clear and coherent history. No signs of neglect She does make errors when speaking such as stating my worker when she means my daughter makes other word substitution errors as well as having some difficulty with word finding.  She has mild difficulty with repetition. Cranial Nerves: II: Visual Fields are full. Pupils are equal, round, and reactive to light.   III,IV, VI: EOMI without ptosis or diploplia.  V: Facial sensation is symmetric to temperature VII: Facial movement is symmetric.  VIII: hearing is intact to voice X: Uvula elevates symmetrically XII: tongue is midline without atrophy or fasciculations.  Motor: Tone is normal. Bulk is normal. 5/5 strength was present in all four extremities.  Sensory: Sensation is symmetric to light touch and temperature in the arms and legs. Cerebellar: FNF and HKS are intact bilaterally        Labs   CBC:  Recent Labs  Lab 08/27/24 1222 08/27/24 1226  WBC 10.4  --   NEUTROABS 8.2*  --   HGB 15.4* 15.6*  HCT 45.0 46.0  MCV 86.9  --   PLT 369  --     Basic Metabolic Panel:  Lab Results  Component Value Date   NA 144 08/27/2024   K 4.2 08/27/2024   CO2 21 (L) 08/27/2024   GLUCOSE 138 (H) 08/27/2024   BUN 33 (H) 08/27/2024    CREATININE 1.90 (H) 08/27/2024   CALCIUM  10.0 08/27/2024   GFRNONAA 28 (L) 08/27/2024   Lipid Panel:  Lab Results  Component Value  Date   LDLCALC 154 (H) 02/17/2024   HgbA1c: No results found for: HGBA1C Urine Drug Screen:     Component Value Date/Time   LABOPIA NONE DETECTED 08/27/2024 1309   COCAINSCRNUR NONE DETECTED 08/27/2024 1309   LABBENZ NONE DETECTED 08/27/2024 1309   AMPHETMU NONE DETECTED 08/27/2024 1309   THCU NONE DETECTED 08/27/2024 1309   LABBARB NONE DETECTED 08/27/2024 1309    Alcohol Level     Component Value Date/Time   ETH <15 08/27/2024 1222   INR  Lab Results  Component Value Date   INR 1.0 08/27/2024   APTT  Lab Results  Component Value Date   APTT 29 08/27/2024    CT Head without contrast(Personally reviewed): Caudate head hemorrhage with intraventricular extension  Impression   Rhonda Cortez is a 63 y.o. female with a history of CKD and hypertension who presents with confusion and found to have a small head of the caudate hemorrhage.  She does have a history of hypertension, but was not particular hypertensive on admission.  I still think that this is the most likely etiology, but I would favor getting an MRI as well.  Other possible etiologies including hemorrhagic conversion of an ischemic infarct could also be considered  She is still within 24 hours of onset of symptoms, and I do think overnight monitoring in the ICU would be prudent.  Primary Diagnosis:  Nontraumatic intracerebral hemorrhage in hemisphere, subcortical  Secondary Diagnosis: Essential (primary) hypertension CKD stage II  Recommendations  1) Admit to ICU 2) no antiplatelets or anticoagulants 3) blood pressure control with goal systolic 130 - 150 4) Frequent neuro checks 5) If symptoms worsen or there is decreased mental status, repeat stat head CT 6) PT,OT,ST 7) continue home amlodipine ______________________________________________________________________  This  patient is critically ill and at significant risk of neurological worsening, death and care requires constant monitoring of vital signs, hemodynamics,respiratory and cardiac monitoring, neurological assessment, discussion with family, other specialists and medical decision making of high complexity. I spent 40 minutes of neurocritical care time  in the care of  this patient. This was time spent independent of any time provided by nurse practitioner or PA.  Aisha Seals, MD [facility]   If 7pm- 7am, please page neurology on call as listed in AMION. 08/27/2024  7:48 PM

## 2024-08-27 NOTE — ED Provider Notes (Signed)
 [facility] AT [facility] Provider Note   CSN: [id number] Arrival date & time: 08/27/24  1132     Patient presents with: Altered Mental Status   Rhonda Cortez is a 63 y.o. female.  With a history of ischemic stroke who presents to the ED for altered mental status.  Per patient's sister she was supposed to meet up with a friend around 1900 last night.  She was in her normal state of health prior to that time but the friend became concerned when she did not arrive.  Family members were unable to contact her until 0300 this morning when she finally answered her phone.  When she did answer her phone she was noted to be quite confused and significantly altered from baseline.  Global confusion.  No significant weakness on one side of her body reported headaches nausea vomiting changes in vision.  Patient herself states she is here because of my kidneys but is unable to contribute additional history at this time   HPI     Prior to Admission medications   Medication Sig Start Date End Date Taking? Authorizing Provider  amLODipine (NORVASC) 10 MG tablet Take 10 mg by mouth daily.    [provider]  cholecalciferol (VITAMIN D3) 25 MCG (1000 UNIT) tablet Take 5,000 Units by mouth daily.    [provider]  Pitavastatin  Calcium  1 MG TABS Take 1 tablet (1 mg total) by mouth daily. START with 1 pill Monday, Wednesday, Friday for 2 weeks, then increase to every other day for 2 weeks, then take daily. 03/09/24   Lucius Krabbe, NP  triamcinolone  0.1% oint-Eucerin equivalent cream 1:1 mixture Apply topically 2 (two) times daily. 05/10/24   Lucius Krabbe, NP  valsartan  (DIOVAN ) 80 MG tablet Take 1 tablet (80 mg total) by mouth daily. 02/03/23   Lucius Krabbe, NP    Allergies: Patient has no known allergies.    Review of Systems  Updated Vital Signs BP (!) 137/94   Pulse (!) 104   Temp 97.8 F (36.6 C) (Oral)   Resp (!) 21   Ht 5' 3  (1.6 m)   Wt 54.4 kg   SpO2 100%   BMI 21.26 kg/m   Physical Exam Vitals and nursing note reviewed.  HENT:     Head: Normocephalic and atraumatic.  Eyes:     Pupils: Pupils are equal, round, and reactive to light.  Cardiovascular:     Rate and Rhythm: Normal rate and regular rhythm.  Pulmonary:     Effort: Pulmonary effort is normal.     Breath sounds: Normal breath sounds.  Abdominal:     Palpations: Abdomen is soft.     Tenderness: There is no abdominal tenderness.  Skin:    General: Skin is warm and dry.  Neurological:     Mental Status: She is alert.     Comments: Oriented to self only No focal weakness sensory deficits slurred speech Visual fields grossly intact Normal finger-nose  Psychiatric:        Mood and Affect: Mood normal.     (all labs ordered are listed, but only abnormal results are displayed) Labs Reviewed  CBC - Abnormal; Notable for the following components:      Result Value   RBC 5.18 (*)    Hemoglobin 15.4 (*)    All other components within normal limits  DIFFERENTIAL - Abnormal; Notable for the following components:   Neutro Abs 8.2 (*)    All  other components within normal limits  COMPREHENSIVE METABOLIC PANEL WITH GFR - Abnormal; Notable for the following components:   CO2 21 (*)    Glucose, Bld 131 (*)    BUN 29 (*)    Creatinine, Ser 1.97 (*)    Total Protein 8.2 (*)    GFR, Estimated 28 (*)    Anion gap 16 (*)    All other components within normal limits  URINALYSIS, ROUTINE W REFLEX MICROSCOPIC - Abnormal; Notable for the following components:   Hgb urine dipstick TRACE (*)    Protein, ur 100 (*)    Leukocytes,Ua TRACE (*)    All other components within normal limits  URINALYSIS, MICROSCOPIC (REFLEX) - Abnormal; Notable for the following components:   Bacteria, UA FEW (*)    All other components within normal limits  I-STAT CHEM 8, ED - Abnormal; Notable for the following components:   BUN 33 (*)    Creatinine, Ser 1.90 (*)     Glucose, Bld 138 (*)    Hemoglobin 15.6 (*)    All other components within normal limits  CBG MONITORING, ED - Abnormal; Notable for the following components:   Glucose-Capillary 135 (*)    All other components within normal limits  PROTIME-INR  APTT  ETHANOL  RAPID URINE DRUG SCREEN, HOSP PERFORMED    EKG: None  Radiology: CT HEAD WO CONTRAST Result Date: 08/27/2024 EXAM: CT HEAD WITHOUT study_datetime TECHNIQUE: CT of the head was performed without the administration of intravenous contrast. Automated exposure control, iterative reconstruction, and/or weight based adjustment of the mA/kV was utilized to reduce the radiation dose to as low as reasonably achievable. COMPARISON: None available. CLINICAL HISTORY: 63 year old female was missing overnight, memory loss. FINDINGS: BRAIN AND VENTRICLES: Moderate to large volume of hyperdense blood in the left lateral ventricle, the 3rd and 4th ventricles. There is evidence of associated biconvex intra-axial hemorrhage of the left caudate nucleus on series 2 image 15. Estimated caudate intra-axial blood products measure 18 x 10 x 17 mm (AP x transverse x CC), with an estimated volume of 2 mL. Probable tubular hypodense thrombus is present within the left lateral ventricle blood products. There is a trace rightward midline shift (2 to 3 mm) in part related to mild asymmetric enlargement of the left lateral ventricle. Temporal horns are diminutive. No transependymal edema is evident. Basilar cisterns remain patent. There is no convincing subarachnoid hemorrhage. Patchy and confluent bilateral cerebral white matter hypodensity in both hemispheres is advanced for age. There is evidence of small volume cortical encephalomalacia in the anterior right frontal lobe medially on series 2 image 17. Heterogeneous hypodensity is also noted in the bilateral thalami. Posterior fossa gray white differentiation is within normal limits. Calcified atherosclerosis is  present at the skull base. Asymmetric right basal ganglia vascular calcifications are noted. No suspicious intracranial vascular hyperdensity. ORBITS: No acute abnormality. SINUSES AND MASTOIDS: Visible paranasal sinuses, middle ears and mastoids are well aerated. SOFT TISSUES AND SKULL: No acute skull fracture. No acute orbital scalp soft tissue finding. IMPRESSION: 1. Acute intracranial hemorrhage appears to be significant intraventricular extension from an acute bleed in the left caudate nucleus (estimated 2 mL). 2. Mild asymmetric enlargement of the left lateral due to blood ;temporal horns remain diminutive. Trace rightward midline shift (23 mm). 3. Evidence of underlying age advanced chronic small vessel disease. 4. Study discussed by telephone with Dr. Pamella at the time of this report. Electronically signed by: Helayne Hurst MD 08/27/2024 01:44 PM EST  RP Workstation: [id number]     .Critical Care  Performed by: Pamella Ozell LABOR, DO Authorized by: Pamella Ozell LABOR, DO   Critical care provider statement:    Critical care time (minutes):  50   Critical care was necessary to treat or prevent imminent or life-threatening deterioration of the following conditions:  CNS failure or compromise   Critical care was time spent personally by me on the following activities:  Development of treatment plan with patient or surrogate, discussions with consultants, evaluation of patient's response to treatment, examination of patient, ordering and review of laboratory studies, ordering and review of radiographic studies, ordering and performing treatments and interventions, pulse oximetry, re-evaluation of patient's condition, review of old charts and obtaining history from patient or surrogate   I assumed direction of critical care for this patient from another provider in my specialty: no     Care discussed with: admitting provider   Comments:     Discussed with neurosurgery Dr. Joshua and neurology Dr.  Michaela    Medications Ordered in the ED - No data to display  Clinical Course as of 08/27/24 1636  Fri Aug 27, 2024  1340 +caudate hemorrhage L.  With subsequent left intraventricular drainage.  No midline shift.  Charge nurse is aware and we are working on getting her to a room [MP]  1432 I have assumed care of this patient as she was transported back to an ED room.  No focal neurodeficits on my exam except for global confusion.  Paged neurosurgery [MP]  1530 Discussed with Dr. Joshua (neurosurgery).  No indication for external ventricular drain placement or other neurosurgical intervention at this time.  Patient will be admitted to neurology service under the care of Dr. Kathren who have discussed the case with and he will evaluate patient in the ED [MP]  1635 I have spoken with patient's sister over the phone and 2 sons at bedside and updated them on her clinical status and plan of care.  She remains full code at this time [MP]    Clinical Course User Index [MP] Pamella Ozell LABOR, DO                                 Medical Decision Making 63 year old female with history as above presenting for global confusion that started sometime last night.  No known trauma no evidence of trauma on my exam.  No focal neurologic deficits but continues to exhibit signs of global confusion.  She was evaluated in triage where CT head showed acute intracranial hemorrhage from the left caudate region with extension into intraventricular region on the left.  Will reach out to neurosurgery and neurology as well for further evaluation and management  Amount and/or Complexity of Data Reviewed Labs: ordered. Radiology: ordered.  Risk Decision regarding hospitalization.        Final diagnoses:  Intraparenchymal hemorrhage of brain [facility])    ED Discharge Orders     None          Pamella Ozell LABOR, DO 08/27/24 1636

## 2024-08-28 ENCOUNTER — Inpatient Hospital Stay (HOSPITAL_COMMUNITY)

## 2024-08-28 DIAGNOSIS — N1831 Chronic kidney disease, stage 3a: Secondary | ICD-10-CM

## 2024-08-28 DIAGNOSIS — R29702 NIHSS score 2: Secondary | ICD-10-CM

## 2024-08-28 DIAGNOSIS — I619 Nontraumatic intracerebral hemorrhage, unspecified: Secondary | ICD-10-CM

## 2024-08-28 DIAGNOSIS — I10 Essential (primary) hypertension: Secondary | ICD-10-CM | POA: Diagnosis not present

## 2024-08-28 DIAGNOSIS — I615 Nontraumatic intracerebral hemorrhage, intraventricular: Principal | ICD-10-CM

## 2024-08-28 LAB — HEMOGLOBIN A1C
Hgb A1c MFr Bld: 5.8 % — ABNORMAL HIGH (ref 4.8–5.6)
Mean Plasma Glucose: 119.76 mg/dL

## 2024-08-28 LAB — CBC
HCT: 43.4 % (ref 36.0–46.0)
Hemoglobin: 15.1 g/dL — ABNORMAL HIGH (ref 12.0–15.0)
MCH: 30.1 pg (ref 26.0–34.0)
MCHC: 34.8 g/dL (ref 30.0–36.0)
MCV: 86.6 fL (ref 80.0–100.0)
Platelets: 344 K/uL (ref 150–400)
RBC: 5.01 MIL/uL (ref 3.87–5.11)
RDW: 12.9 % (ref 11.5–15.5)
WBC: 12.1 K/uL — ABNORMAL HIGH (ref 4.0–10.5)
nRBC: 0 % (ref 0.0–0.2)

## 2024-08-28 LAB — ECHOCARDIOGRAM COMPLETE
Area-P 1/2: 5.02 cm2
Height: 63 in
S' Lateral: 2.6 cm
Weight: 2243.4 [oz_av]

## 2024-08-28 LAB — BASIC METABOLIC PANEL WITH GFR
Anion gap: 12 (ref 5–15)
BUN: 25 mg/dL — ABNORMAL HIGH (ref 8–23)
CO2: 22 mmol/L (ref 22–32)
Calcium: 9.5 mg/dL (ref 8.9–10.3)
Chloride: 107 mmol/L (ref 98–111)
Creatinine, Ser: 1.74 mg/dL — ABNORMAL HIGH (ref 0.44–1.00)
GFR, Estimated: 33 mL/min — ABNORMAL LOW (ref >60)
Glucose, Bld: 133 mg/dL — ABNORMAL HIGH (ref 70–99)
Potassium: 4.2 mmol/L (ref 3.5–5.1)
Sodium: 141 mmol/L (ref 135–145)

## 2024-08-28 MED ORDER — LORAZEPAM 1 MG PO TABS
1.0000 mg | ORAL_TABLET | Freq: Once | ORAL | Status: DC | PRN
  Filled 2024-08-28: qty 1

## 2024-08-28 MED ORDER — ONDANSETRON HCL 4 MG/2ML IJ SOLN
4.0000 mg | Freq: Once | INTRAMUSCULAR | Status: AC
  Administered 2024-08-28: 4 mg via INTRAVENOUS

## 2024-08-28 MED ORDER — PANTOPRAZOLE SODIUM 40 MG PO TBEC
40.0000 mg | DELAYED_RELEASE_TABLET | Freq: Every day | ORAL | Status: DC
  Administered 2024-08-28 – 2024-09-01 (×4): 40 mg via ORAL
  Filled 2024-08-28 (×5): qty 1

## 2024-08-28 MED ORDER — ONDANSETRON HCL 4 MG/2ML IJ SOLN
INTRAMUSCULAR | Status: AC
Start: 2024-08-28 — End: 2024-08-28
  Filled 2024-08-28: qty 2

## 2024-08-28 MED ORDER — ONDANSETRON HCL 4 MG/2ML IJ SOLN
4.0000 mg | Freq: Four times a day (QID) | INTRAMUSCULAR | Status: DC | PRN
  Administered 2024-08-31: 4 mg via INTRAVENOUS
  Filled 2024-08-28: qty 2

## 2024-08-28 NOTE — Progress Notes (Addendum)
 STROKE TEAM PROGRESS NOTE    SIGNIFICANT HOSPITAL EVENTS 11/14- Admitted with small ICH   INTERIM HISTORY/SUBJECTIVE MRI overnight tonight, ativan ordered. Bedrest off. Neuro exam stable. Hemodynamically stable, PO BP meds resumed.   OBJECTIVE  CBC    Component Value Date/Time   WBC 10.4 08/27/2024 1222   RBC 5.18 (H) 08/27/2024 1222   HGB 15.6 (H) 08/27/2024 1226   HCT 46.0 08/27/2024 1226   PLT 369 08/27/2024 1222   MCV 86.9 08/27/2024 1222   MCH 29.7 08/27/2024 1222   MCHC 34.2 08/27/2024 1222   RDW 12.8 08/27/2024 1222   LYMPHSABS 1.6 08/27/2024 1222   MONOABS 0.5 08/27/2024 1222   EOSABS 0.0 08/27/2024 1222   BASOSABS 0.0 08/27/2024 1222    BMET    Component Value Date/Time   NA 144 08/27/2024 1226   K 4.2 08/27/2024 1226   CL 110 08/27/2024 1226   CO2 21 (L) 08/27/2024 1222   GLUCOSE 138 (H) 08/27/2024 1226   BUN 33 (H) 08/27/2024 1226   CREATININE 1.90 (H) 08/27/2024 1226   CALCIUM  10.0 08/27/2024 1222   EGFR 33.0 05/12/2024 0000   GFRNONAA 28 (L) 08/27/2024 1222    IMAGING past 24 hours CT HEAD WO CONTRAST ( ) Result Date: 08/28/2024 EXAM: CT HEAD WITHOUT CONTRAST 08/28/2024 05:43:00 AM TECHNIQUE: CT of the head was performed without the administration of intravenous contrast. Automated exposure control, iterative reconstruction, and/or weight based adjustment of the mA/kV was utilized to reduce the radiation dose to as low as reasonably achievable. COMPARISON: 08/27/2024 CLINICAL HISTORY: Stroke, hemorrhagic FINDINGS: BRAIN AND VENTRICLES: The left caudate intraparenchymal hemorrhage noted on the previous study appears unchanged in the interim. There is also no significant interval change in the volume or appearance of intraventricular hemorrhage, which is primarily situated within the left lateral ventricle but is also again demonstrated within the third and fourth ventricles and dependently within the posterior horn of the right lateral ventricle. There is  moderate periventricular and deep cerebral white matter disease. The encephalomalacia changes described within the right anterior frontal lobe on the prior study is not appreciated on the current exam and was likely superior artifact caused by prominent calcification within the falx. There is no adverse interval change. No evidence of acute infarct. No hydrocephalus. No extra-axial collection. No mass effect or midline shift. ORBITS: No acute abnormality. SINUSES: No acute abnormality. SOFT TISSUES AND SKULL: No acute soft tissue abnormality. No skull fracture. IMPRESSION: 1. Stable left caudate intraparenchymal hemorrhage and intraventricular hemorrhage. 2. Moderate periventricular and deep cerebral white matter disease. 3. No adverse interval change. Electronically signed by: Evalene Coho MD 08/28/2024 06:17 AM EST RP Workstation: [id number]   CT HEAD WO CONTRAST Result Date: 08/27/2024 EXAM: CT HEAD WITHOUT study_datetime TECHNIQUE: CT of the head was performed without the administration of intravenous contrast. Automated exposure control, iterative reconstruction, and/or weight based adjustment of the mA/kV was utilized to reduce the radiation dose to as low as reasonably achievable. COMPARISON: None available. CLINICAL HISTORY: 63 year old female was missing overnight, memory loss. FINDINGS: BRAIN AND VENTRICLES: Moderate to large volume of hyperdense blood in the left lateral ventricle, the 3rd and 4th ventricles. There is evidence of associated biconvex intra-axial hemorrhage of the left caudate nucleus on series 2 image 15. Estimated caudate intra-axial blood products measure 18 x 10 x 17 mm (AP x transverse x CC), with an estimated volume of 2 mL. Probable tubular hypodense thrombus is present within the left lateral ventricle blood products. There is  a trace rightward midline shift (2 to 3 mm) in part related to mild asymmetric enlargement of the left lateral ventricle. Temporal horns are  diminutive. No transependymal edema is evident. Basilar cisterns remain patent. There is no convincing subarachnoid hemorrhage. Patchy and confluent bilateral cerebral white matter hypodensity in both hemispheres is advanced for age. There is evidence of small volume cortical encephalomalacia in the anterior right frontal lobe medially on series 2 image 17. Heterogeneous hypodensity is also noted in the bilateral thalami. Posterior fossa gray white differentiation is within normal limits. Calcified atherosclerosis is present at the skull base. Asymmetric right basal ganglia vascular calcifications are noted. No suspicious intracranial vascular hyperdensity. ORBITS: No acute abnormality. SINUSES AND MASTOIDS: Visible paranasal sinuses, middle ears and mastoids are well aerated. SOFT TISSUES AND SKULL: No acute skull fracture. No acute orbital scalp soft tissue finding. IMPRESSION: 1. Acute intracranial hemorrhage appears to be significant intraventricular extension from an acute bleed in the left caudate nucleus (estimated 2 mL). 2. Mild asymmetric enlargement of the left lateral due to blood ;temporal horns remain diminutive. Trace rightward midline shift (23 mm). 3. Evidence of underlying age advanced chronic small vessel disease. 4. Study discussed by telephone with Dr. Pamella at the time of this report. Electronically signed by: Helayne Hurst MD 08/27/2024 01:44 PM EST RP Workstation: [id number]    Vitals:   08/28/24 0530 08/28/24 0600 08/28/24 0700 08/28/24 0800  BP: 136/82 126/79 124/84   Pulse: 86 84 78   Resp: 17 12 12    Temp:    97.9 F (36.6 C)  TempSrc:    Oral  SpO2: 98% 96% 98%   Weight:      Height:         PHYSICAL EXAM General:  Alert, well-nourished, well-developed patient in no acute distress Psych:  Mood and affect appropriate for situation CV: Regular rate and rhythm on monitor Respiratory:  Regular, unlabored respirations on room air GI: Abdomen soft and nontender   NEURO:   Mental Status: Alert and oriented to self, place, situation. Incorrect age  Speech/Language: speech is without dysarthria  Mild difficulty with wording finding, inappropriate word substitution, mild difficulty with repetition  Able to spell world, incorrect backwards  Cranial Nerves:  II: PERRL. Visual fields full.  III, IV, VI: EOMI. Eyelids elevate symmetrically.  V: Sensation is intact to light touch and symmetrical to face.  VII: Face is symmetrical resting and smiling VIII: hearing intact to voice. IX, X: Palate elevates symmetrically. Phonation is normal.  KP:Dynloizm shrug 5/5. XII: tongue is midline without fasciculations. Motor: 5/5 strength to all muscle groups tested.  Tone: is normal and bulk is normal Sensation- Intact to light touch bilaterally. Extinction absent to light touch to DSS.   Coordination: FTN intact bilaterally, HKS: no ataxia in BLE.No drift.  Gait- deferred  Most Recent NIH 2     ASSESSMENT/PLAN  Rhonda Cortez is a 63 y.o. female with history of CKD and hypertension who presents with confusion and found to have a small head of the caudate hemorrhage.  NIH on Admission 1  ICH:  Left caudate head CH with IVH, likely etiology:  hypertensive, but need to rule out AVM CT head -acute intracranial hemorrhage with significant intraventricular extension from an acute bleed in the left caudate nucleus.  Mild asymmetric enlargement of the left lateral ventricle due to blood; temporal horns remain diminutive with trace rightward midline shift Repeat CT head - Caudate head hemorrhage with intraventricular extension  MRI  pending in a.m. MRA pending in a.m. 2D Echo EF 60 to 65% Carotid US  - pending  LDL pending HgbA1c 5.8 UDS negative VTE prophylaxis - SCDs  No antithrombotic prior to admission, now on No antithrombotic due to ICH Therapy recommendations:  Pending Disposition:  Pending   Hx of Stroke/TIA Stroke in 2014 - arm and leg weakness, difficulty  walking Was on ASA after stroke for a number of years and then off No significant residual deficit  Hypertension  Home meds:  Norvasc, valsartan  Resume norvasc  Stable BP goal less than 160 Long-term BP goal normotensive  Hyperlipidemia Home meds:  pitavastatin  LDL pending, goal < 70 Continue statin at discharge  CKD 3a Cr 1.97 - 1.9 GFR 28 Stable Valsartan  on hold   Other Active Problems   Hospital day # 1  Patient seen and examined by NP/APP with MD. MD to update note as needed.   Jorene Last, DNP, FNP-BC [facility] Pager: [phone]  ATTENDING NOTE: I reviewed above note and agree with the assessment and plan. Pt was seen and examined.   Sons are at bedside.  Patient reclining in bed, mildly lethargic, however awake, alert, eyes open, orientated to place, time and people but incorrect on age. Paucity of speech and slow talking, intermittent word finding difficulty and paraphasic errors, following all simple commands. Able to name but difficulty repeat complex sentences.  Decreased attention and concentration.  No gaze palsy, tracking bilaterally, visual field full, PERRL. No facial droop. Tongue midline. Bilateral UEs 5/5, no drift. Bilaterally LEs 5/5, no drift. Sensation symmetrical bilaterally, b/l FTN intact, gait not tested.   For detailed assessment and plan, please refer to above as I have made changes wherever appropriate.   Rhonda Cummins, MD PhD Stroke Neurology 08/28/2024 7:51 PM  This patient is critically ill due to ICH and IVH and at significant risk of neurological worsening, death form hematoma expansion, cerebral edema and brain herniation, obstructive hydrocephalus. This patient's care requires constant monitoring of vital signs, hemodynamics, respiratory and cardiac monitoring, review of multiple databases, neurological assessment, discussion with family, other specialists and medical decision making of high complexity. I spent 40  minutes of neurocritical care time in the care of this patient. I had long discussion with sons at bedside, updated pt current condition, treatment plan and potential prognosis, and answered all the questions. They expressed understanding and appreciation.     To contact Stroke Continuity provider, please refer to [email or web address]. After hours, contact General Neurology

## 2024-08-28 NOTE — Progress Notes (Signed)
 PT Cancellation Note  Patient Details Name: Rhonda Cortez MRN: [id number] DOB: 03-09-1961   Cancelled Treatment:    Reason Eval/Treat Not Completed: (P) Active bedrest order x24 hours starting 08/27/24 at 16:45. Will likely not be able to follow-up until tomorrow if medically appropriate.   Theo Ferretti, PT, DPT Acute Rehabilitation Services  Office: [phone]    [name] 08/28/2024, 7:20 AM

## 2024-08-28 NOTE — Evaluation (Signed)
 Speech Language Pathology Evaluation Patient Details Name: Rhonda Cortez MRN: [id number] DOB: [birth year] Today's Date: 08/28/2024 Time: 8664-8640 SLP Time Calculation (min) (ACUTE ONLY): 24 min  Problem List:  Patient Active Problem List   Diagnosis Date Noted   ICH (intracerebral hemorrhage) (HCC) 08/27/2024   Mixed hyperlipidemia 02/17/2024   Positive colorectal cancer screening using Cologuard test 02/17/2024   Essential (primary) hypertension 01/03/2023   History of CVA (cerebrovascular accident) 01/03/2023   Gastroesophageal reflux disease without esophagitis 01/03/2023   Constipation 01/03/2023   Past Medical History:  Past Medical History:  Diagnosis Date   Hypertension    Stroke [facility])    Past Surgical History:  Past Surgical History:  Procedure Laterality Date   OOPHORECTOMY     TUBAL LIGATION     HPI:  Patient is a 63 y.o. female with PMH: HTN, prior stroke. At baseline, she lives by herself in a house, drives and works full time. She presented to the hospital on 08/27/2024 after having confusion as well as difficulty word finding and felt lilke she was missing some time the previous date. In ED, [facility] showed a left caudate head hemorrhage with intraventricular extension as well as moderate periventricular and deep cerebral white matter disease. MRI pending.   Assessment / Plan / Recommendation Clinical Impression  Patient presents with a mild expressive aphasia and a mild-moderate cognitive impairment as per this evaluation. Overall, patient's affect is flat, voice is low in intensity and monotone and patient does not demonstrate adequate eye contact during conversation with SLP. She was oriented to self, place, and day of week. She initially responded that the month was November or December and asked which of those, she picked November. She stated that the year was 79 or 95 but she gave her correct current age. She was 100% accurate with basic and mild complex yes/no  questions, 100% accurate with one and two step direction following. Repetition of words and phrases was 100% accurate. When describing picture scene, information provided was limited and fairly aggramatical. She named six of seven object drawings and for the one she did not name (anchor), she described, a thing you put in the water to stop a boat. 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 She did not exhibit adequate awareness to deficits and was only able to tell SLP that she was here because of effects of a stroke but unable to elaborate. Her voice was low in intensity and SLP had to stand close to her in order to hear her when speaking. SLP will continue to follow patient to address goals in the areas of cognition and language.    SLP Assessment  SLP Recommendation/Assessment: Patient needs continued Speech Language Pathology Services SLP Visit Diagnosis: Cognitive communication deficit (R41.841);Aphasia (R47.01)     Assistance Recommended at Discharge  Frequent or constant Supervision/Assistance  Functional Status Assessment Patient has had a recent decline in their functional status and demonstrates the ability to make significant improvements in function in a reasonable and predictable amount of time.  Frequency and Duration min 2x/week  1 week      SLP Evaluation Cognition  Overall Cognitive Status: Impaired/Different from baseline Arousal/Alertness: Awake/alert Orientation Level: Oriented to person;Oriented to place;Disoriented to time Year: Other (Comment) (94 or 95) Month: November Day of Week: Correct Attention: Selective Selective Attention: Impaired Selective Attention Impairment: Verbal complex Memory: Impaired Memory Impairment: Storage deficit;Decreased recall of new information Awareness:  Impaired Awareness Impairment: Intellectual impairment Problem Solving: Impaired Problem Solving  Impairment: Verbal basic Executive Function: Initiating Initiating: Impaired Initiating Impairment: Verbal basic;Functional basic       Comprehension  Auditory Comprehension Overall Auditory Comprehension: Appears within functional limits for tasks assessed Yes/No Questions: Within Functional Limits Commands: Within Functional Limits Conversation: Simple Visual Recognition/Discrimination Discrimination: Not tested Reading Comprehension Reading Status: Not tested    Expression Expression Primary Mode of Expression: Verbal Verbal Expression Overall Verbal Expression: Impaired Initiation: No impairment Level of Generative/Spontaneous Verbalization: Phrase;Sentence Repetition: No impairment Naming: Impairment Responsive: 76-100% accurate Confrontation: Impaired Convergent: Not tested Divergent: Not tested Pragmatics: Impairment Impairments: Abnormal affect;Monotone;Eye contact Non-Verbal Means of Communication: Not applicable Written Expression Written Expression: Not tested   Oral / Motor  Oral Motor/Sensory Function Overall Oral Motor/Sensory Function: Within functional limits Motor Speech Overall Motor Speech: Appears within functional limits for tasks assessed Respiration: Within functional limits Phonation: Low vocal intensity Resonance: Within functional limits Articulation: Within functional limitis Intelligibility: Intelligible Motor Planning: Within functional limits Motor Speech Errors: Not applicable            Norleen IVAR Blase, MA, CCC-SLP Speech Therapy

## 2024-08-28 NOTE — Progress Notes (Signed)
 OT Cancellation Note  Patient Details Name: Rhonda Cortez MRN: [id number] DOB: 06-06-1961   Cancelled Treatment:    Reason Eval/Treat Not Completed: Active bedrest order (bedrest x24 hours beginning 11/14 at 1645, will follow up for OT eval when appropriate)  Analise Glotfelty K, OTD, OTR/L [employer] [facility]erred Acute Rehab [phone]   [name] 08/28/2024, 6:19 AM

## 2024-08-28 NOTE — Plan of Care (Signed)

## 2024-08-28 NOTE — Progress Notes (Signed)
  Echocardiogram 2D Echocardiogram has been performed.  Rhonda Cortez 08/28/2024, 10:41 AM

## 2024-08-29 ENCOUNTER — Inpatient Hospital Stay (HOSPITAL_COMMUNITY)

## 2024-08-29 DIAGNOSIS — E785 Hyperlipidemia, unspecified: Secondary | ICD-10-CM

## 2024-08-29 LAB — CBC
HCT: 44.6 % (ref 36.0–46.0)
Hemoglobin: 14.8 g/dL (ref 12.0–15.0)
MCH: 29.7 pg (ref 26.0–34.0)
MCHC: 33.2 g/dL (ref 30.0–36.0)
MCV: 89.6 fL (ref 80.0–100.0)
Platelets: 342 K/uL (ref 150–400)
RBC: 4.98 MIL/uL (ref 3.87–5.11)
RDW: 12.8 % (ref 11.5–15.5)
WBC: 12.2 K/uL — ABNORMAL HIGH (ref 4.0–10.5)
nRBC: 0 % (ref 0.0–0.2)

## 2024-08-29 LAB — BASIC METABOLIC PANEL WITH GFR
Anion gap: 13 (ref 5–15)
BUN: 28 mg/dL — ABNORMAL HIGH (ref 8–23)
CO2: 25 mmol/L (ref 22–32)
Calcium: 9.4 mg/dL (ref 8.9–10.3)
Chloride: 106 mmol/L (ref 98–111)
Creatinine, Ser: 2.19 mg/dL — ABNORMAL HIGH (ref 0.44–1.00)
GFR, Estimated: 25 mL/min — ABNORMAL LOW (ref >60)
Glucose, Bld: 116 mg/dL — ABNORMAL HIGH (ref 70–99)
Potassium: 4.4 mmol/L (ref 3.5–5.1)
Sodium: 144 mmol/L (ref 135–145)

## 2024-08-29 LAB — LIPID PANEL
Cholesterol: 241 mg/dL — ABNORMAL HIGH (ref 0–200)
HDL: 53 mg/dL (ref >40)
LDL Cholesterol: 164 mg/dL — ABNORMAL HIGH (ref 0–99)
Total CHOL/HDL Ratio: 4.5 ratio
Triglycerides: 119 mg/dL (ref <150)
VLDL: 24 mg/dL (ref 0–40)

## 2024-08-29 MED ORDER — LORAZEPAM 2 MG/ML IJ SOLN: 1.0000 mg | Freq: Once | INTRAMUSCULAR | Status: AC | PRN

## 2024-08-29 MED ORDER — LACTATED RINGERS IV BOLUS
500.0000 mL | Freq: Once | INTRAVENOUS | Status: AC
  Administered 2024-08-29: 500 mL via INTRAVENOUS

## 2024-08-29 MED ORDER — LORAZEPAM 2 MG/ML IJ SOLN
INTRAMUSCULAR | Status: AC
  Administered 2024-08-29: 1 mg via INTRAVENOUS
  Filled 2024-08-29: qty 1

## 2024-08-29 MED ORDER — LORAZEPAM 2 MG/ML IJ SOLN
1.0000 mg | Freq: Once | INTRAMUSCULAR | Status: DC | PRN
  Administered 2024-08-29: 1 mg via INTRAVENOUS

## 2024-08-29 MED ORDER — ATORVASTATIN CALCIUM 40 MG PO TABS
40.0000 mg | ORAL_TABLET | Freq: Every day | ORAL | Status: DC
  Administered 2024-08-30 – 2024-09-02 (×4): 40 mg via ORAL
  Filled 2024-08-29 (×4): qty 1

## 2024-08-29 NOTE — Progress Notes (Signed)
 Inpatient Rehab Admissions Coordinator Note:   Per therapy patient was screened for CIR candidacy by Marx Doig SHAUNNA Yvone Cohens, CCC-SLP. At this time, pt appears to be a potential candidate for CIR. I will place an order for rehab consult for full assessment, per our protocol.  Please contact me any with questions.SABRA Tinnie Yvone Cohens, MS, CCC-SLP Admissions Coordinator [phone] 08/29/24 5:23 PM

## 2024-08-29 NOTE — Evaluation (Signed)
 Occupational Therapy Evaluation Patient Details Name: Rhonda Cortez MRN: [id number] DOB: 15-Sep-1961 Today's Date: 08/29/2024   History of Present Illness   Pt is a 63 y.o. female presenting 11/14 with difficulty word finding and confusion. CTH 11/14 with acute intracranial hemorrhage in the left caudate nucleus with intraventricular extension; trace rightward midline shift. CTH 11/15 with bleed stable. PMH: HTN, CVA     Clinical Impressions PTA, pt living alone, independent, and working. Upon eval, pt needing up to min A for LB ADL and OOB mobility. Pt with decr orientation needing cues for day of the week and to fully orient to situation. Pt family reports she is mixing this admission up with an admission when she was in [place] . Pt needing increased time to follow commands and for problem solving. With urinary urgency during session and incontinence; reporting this is new for her. Pt presents with dysdiadokinesia bilaterally, decreased coordination with LUE finger to nose, decreased coordination, strength, and balance. 4 family members present and supportive in session. Due to significant functional decline, recommending intensive multidisciplinary rehabilitation >3 hours/day to optimize safety and independence in ADL.       If plan is discharge home, recommend the following:   A little help with walking and/or transfers;A little help with bathing/dressing/bathroom;Assistance with cooking/housework;Assist for transportation;Help with stairs or ramp for entrance     Functional Status Assessment   Patient has had a recent decline in their functional status and demonstrates the ability to make significant improvements in function in a reasonable and predictable amount of time.     Equipment Recommendations   Other (comment) (defer)     Recommendations for Other Services   Rehab consult;Speech consult (cog)     Precautions/Restrictions   Precautions Precautions: Fall;Other  (comment) Recall of Precautions/Restrictions: Impaired Precaution/Restrictions Comments: SBP < 160 Restrictions Weight Bearing Restrictions Per Provider Order: No     Mobility Bed Mobility Overal bed mobility: Needs Assistance Bed Mobility: Sit to Supine       Sit to supine: Supervision   General bed mobility comments: Supervision for safety returning to supine from sitting EOB    Transfers Overall transfer level: Needs assistance Equipment used: None Transfers: Sit to/from Stand Sit to Stand: Min assist           General transfer comment: Pt leaning her legs posteriorly against wheelchair for support with sit to stand, minA for balance and cues to shift weight anteriorly      Balance Overall balance assessment: Needs assistance Sitting-balance support: No upper extremity supported, Feet supported Sitting balance-Leahy Scale: Fair Sitting balance - Comments: posterior lean with dynamic tasks, supervision sitting statically Postural control: Posterior lean, Right lateral lean Standing balance support: Bilateral upper extremity supported, No upper extremity supported, During functional activity Standing balance-Leahy Scale: Poor Standing balance comment: reliant on RW and staggers R and posteriorly with minA to recover when using RW, up to modA to recover without UE support                           ADL either performed or assessed with clinical judgement   ADL Overall ADL's : Needs assistance/impaired Eating/Feeding: Set up;Sitting   Grooming: Minimal assistance;Standing   Upper Body Bathing: Set up;Sitting   Lower Body Bathing: Minimal assistance;Sitting/lateral leans;Sit to/from stand   Upper Body Dressing : Set up;Sitting   Lower Body Dressing: Minimal assistance;Sitting/lateral leans;Sit to/from stand   Toilet Transfer: Minimal assistance;Ambulation;Stand-pivot;BSC/3in1  Functional mobility during ADLs: Minimal assistance        Vision Patient Visual Report: No change from baseline Vision Assessment?: No apparent visual deficits Additional Comments: pt reports i actually think my vision is better, however, not formally assessed by OT this session     Perception         Praxis         Pertinent Vitals/Pain Pain Assessment Pain Assessment: Faces Faces Pain Scale: No hurt Pain Intervention(s): Limited activity within patient's tolerance, Monitored during session     Extremity/Trunk Assessment Upper Extremity Assessment Upper Extremity Assessment: RUE deficits/detail;LUE deficits/detail RUE Deficits / Details: pt reports prior CVA resulted in R sided weakness, overall WFL during session, 4/5 push/shoulder flexion. LUE Deficits / Details: mildly decreased coordination with finger to nose testing   Lower Extremity Assessment Lower Extremity Assessment: Defer to PT evaluation   Cervical / Trunk Assessment Cervical / Trunk Assessment: Normal   Communication Communication Communication: Impaired Factors Affecting Communication: Difficulty expressing self (some word finding difficulty intermittently)   Cognition Arousal: Alert Behavior During Therapy: WFL for tasks assessed/performed Cognition: Cognition impaired   Orientation impairments: Time (per family confusing this CVA with a previous CVA intermittently) Awareness: Intellectual awareness intact, Online awareness impaired (aware of deficits as they are happening, difficulty anticipating them)                         Following commands: Impaired Following commands impaired: Follows multi-step commands with increased time, Follows multi-step commands inconsistently     Cueing  General Comments   Cueing Techniques: Verbal cues;Tactile cues  VSS on RA   Exercises     Shoulder Instructions      Home Living Family/patient expects to be discharged to:: Private residence Living Arrangements: Alone Available Help at  Discharge: Family;Available 24 hours/day Type of Home: House Home Access: Stairs to enter [employer] of Steps: 2 Entrance Stairs-Rails: None Home Layout: One level     Bathroom Shower/Tub: Tub/shower unit;Walk-in shower   Bathroom Toilet: Standard     Home Equipment: Shower seat;Grab bars - tub/shower;Hand held shower head      Lives With: Alone    Prior Functioning/Environment Prior Level of Function : Independent/Modified Independent;Driving;Working/employed             Mobility Comments: No AD 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 Independent in ADL, IADL, working, driving    OT Problem List: Decreased strength;Decreased activity tolerance;Impaired balance (sitting and/or standing);Decreased coordination;Decreased cognition;Decreased knowledge of use of DME or AE;Decreased knowledge of precautions   OT Treatment/Interventions: Self-care/ADL training;Therapeutic exercise;DME and/or AE instruction;Therapeutic activities;Patient/family education;Balance training;Cognitive remediation/compensation      OT Goals(Current goals can be found in the care plan section)   Acute Rehab OT Goals Patient Stated Goal: get better OT Goal Formulation: With patient Time For Goal Achievement: 09/12/24 Potential to Achieve Goals: Good   OT Frequency:  Min 2X/week    Co-evaluation              AM-PAC OT 6 Clicks Daily Activity     Outcome Measure Help from another person eating meals?: A Little Help from another person taking care of personal grooming?: A Little Help from another person toileting, which includes using toliet, bedpan, or urinal?: A Little Help from another person bathing (including washing, rinsing, drying)?: A Little Help from another person to put on and taking off regular upper body clothing?: A Little Help from another person to  put on and taking off regular lower body clothing?: A Little 6 Click Score: 18   End of Session  Equipment Utilized During Treatment: Gait belt Nurse Communication: Mobility status  Activity Tolerance: Patient tolerated treatment well Patient left: in bed;with call bell/phone within reach;with bed alarm set;with family/visitor present (family assisting pt with ordering meal)  OT Visit Diagnosis: Unsteadiness on feet (R26.81);Muscle weakness (generalized) (M62.81);Other symptoms and signs involving cognitive function                Time: 1500-1535 OT Time Calculation (min): 35 min Charges:  OT General Charges $OT Visit: 1 Visit OT Evaluation $OT Eval Moderate Complexity: 1 Mod OT Treatments $Self Care/Home Management : 8-22 mins  Elma JONETTA Lebron FREDERICK, OTR/L [facility] Acute Rehabilitation Office: [phone]   Elma JONETTA Lebron 08/29/2024, 4:26 PM

## 2024-08-29 NOTE — Evaluation (Signed)
 Physical Therapy Evaluation Patient Details Name: Rhonda Cortez MRN: [id number] DOB: [birth year] Today's Date: 08/29/2024  History of Present Illness  Pt is a 63 y.o. female presenting 11/14 with difficulty word finding and confusion. CTH 11/14 with acute intracranial hemorrhage in the left caudate nucleus with intraventricular extension; trace rightward midline shift. CTH 11/15 with bleed stable. PMH: HTN, CVA   Clinical Impression  Pt presents with condition above and deficits mentioned below, see PT Problem List. PTA, she was independent, working, and living alone in a 1-level house with 2 STE. She has good social support able to provide her with 24/7 care at d/c. Currently, she is displaying deficits in cognition, balance, endurance, R lower extremity coordination (dysdiadochokinesia and dysmetria noted), and R ankle dynamic proprioception (accurate but delayed). She is at high risk for falls, demonstrating a tendency to sway and stagger posteriorly and to her R. She needed up to modA to prevent LOB when taking a few steps without UE support and up to minA for balance when ambulating with a RW. She endorses some R residual weakness from her prior CVA but her lower extremity strength appeared symmetrical and intact bil. The pt has had a drastic functional decline and could greatly benefit from intensive inpatient rehab, > 3 hours/day, as she is highly motivated to return to being independent. Will continue to follow acutely.       If plan is discharge home, recommend the following: A lot of help with walking and/or transfers;A lot of help with bathing/dressing/bathroom;Assistance with cooking/housework;Direct supervision/assist for medications management;Direct supervision/assist for financial management;Assist for transportation;Help with stairs or ramp for entrance;Supervision due to cognitive status   Can travel by private vehicle        Equipment Recommendations Rolling walker (2  wheels);BSC/3in1  Recommendations for Other Services  Rehab consult    Functional Status Assessment Patient has had a recent decline in their functional status and demonstrates the ability to make significant improvements in function in a reasonable and predictable amount of time.     Precautions / Restrictions Precautions Precautions: Fall;Other (comment) Recall of Precautions/Restrictions: Impaired Precaution/Restrictions Comments: SBP < 160 Restrictions Weight Bearing Restrictions Per Provider Order: No      Mobility  Bed Mobility Overal bed mobility: Needs Assistance Bed Mobility: Sit to Supine       Sit to supine: Supervision   General bed mobility comments: Supervision for safety returning to supine from sitting EOB    Transfers Overall transfer level: Needs assistance Equipment used: None Transfers: Sit to/from Stand Sit to Stand: Min assist           General transfer comment: Pt leaning her legs posteriorly against wheelchair for support with sit to stand, minA for balance and cues to shift weight anteriorly    Ambulation/Gait Ambulation/Gait assistance: Min assist, Mod assist, Contact guard assist Gait Distance (Feet): 110 Feet Assistive device: None, Rolling walker (2 wheels) Gait Pattern/deviations: Step-through pattern, Decreased dorsiflexion - right, Decreased stride length, Staggering right, Drifts right/left, Narrow base of support, Decreased step length - right, Decreased step length - left Gait velocity: reduced Gait velocity interpretation: <1.31 ft/sec, indicative of household ambulator   General Gait Details: Pt ambulates with slow, small, narrow bil steps with slightly decreased R dorsiflexion during swing phase. Pt has a tendency to drift and stagger to the R with and without the RW. Verbal and tactile cues provided to widen stance. Pt also would sway and stagger posteriorly intermittently. Min-modA to ambulate ~6 ft without UE  support. CGA-minA  to ambulate with RW the remaining distance.  Stairs            Wheelchair Mobility     Tilt Bed    Modified Rankin (Stroke Patients Only) Modified Rankin (Stroke Patients Only) Pre-Morbid Rankin Score: No symptoms Modified Rankin: Moderately severe disability     Balance Overall balance assessment: Needs assistance Sitting-balance support: No upper extremity supported, Feet supported Sitting balance-Leahy Scale: Fair Sitting balance - Comments: posterior lean with dynamic tasks, supervision sitting statically Postural control: Posterior lean, Right lateral lean Standing balance support: Bilateral upper extremity supported, No upper extremity supported, During functional activity Standing balance-Leahy Scale: Poor Standing balance comment: reliant on RW and staggers R and posteriorly with minA to recover when using RW, up to modA to recover without UE support                             Pertinent Vitals/Pain Pain Assessment Pain Assessment: Faces Faces Pain Scale: No hurt Pain Intervention(s): Monitored during session    Home Living Family/patient expects to be discharged to:: Private residence Living Arrangements: Alone Available Help at Discharge: Family;Available 24 hours/day Type of Home: House Home Access: Stairs to enter Entrance Stairs-Rails: None Entrance Stairs-Number of Steps: 2   Home Layout: One level Home Equipment: Shower seat;Grab bars - tub/shower      Prior Function Prior Level of Function : Independent/Modified Independent;Driving;Working/employed             Mobility Comments: No AD 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     Extremity/Trunk Assessment   Upper Extremity Assessment Upper Extremity Assessment: Defer to OT evaluation    Lower Extremity Assessment Lower Extremity Assessment: RLE deficits/detail RLE Deficits / Details: reports prior CVA resulted in R-sided weakness and endorses residual deficits but  MMT scores symmetrical bil with 4+ hip flexion, 5 knee extension, 5 ankle dorsiflexion; sensation intact bil; dysdiadochokinesia and dysmetria noted on R; delayed but accurate proprioception in ankle RLE Sensation: decreased proprioception RLE Coordination: decreased fine motor;decreased gross motor    Cervical / Trunk Assessment Cervical / Trunk Assessment: Normal  Communication   Communication Communication: Impaired Factors Affecting Communication: Difficulty expressing self (some word finding difficulty intermittently)    Cognition Arousal: Alert Behavior During Therapy: WFL for tasks assessed/performed   PT - Cognitive impairments: Memory, Attention, Sequencing, Problem solving                       PT - Cognition Comments: Family endorsing pt has some memory deficits now, which was noticed with pt repeating things intermittently. Difficulty following multi-step commands. Delayed response at times Following commands: Impaired Following commands impaired: Follows multi-step commands with increased time, Follows multi-step commands inconsistently     Cueing Cueing Techniques: Verbal cues, Tactile cues     General Comments General comments (skin integrity, edema, etc.): VSS on RA    Exercises     Assessment/Plan    PT Assessment Patient needs continued PT services  PT Problem List Decreased strength;Decreased balance;Decreased activity tolerance;Decreased mobility;Decreased coordination;Decreased cognition;Decreased knowledge of use of DME;Decreased safety awareness       PT Treatment Interventions DME instruction;Gait training;Stair training;Functional mobility training;Therapeutic activities;Therapeutic exercise;Balance training;Neuromuscular re-education;Cognitive remediation;Patient/family education    PT Goals (Current goals can be found in the Care Plan section)  Acute Rehab PT Goals Patient Stated Goal: to be independent PT Goal Formulation: With  patient/family Time For  Goal Achievement: 09/12/24 Potential to Achieve Goals: Good    Frequency Min 3X/week     Co-evaluation               AM-PAC PT 6 Clicks Mobility  Outcome Measure Help needed turning from your back to your side while in a flat bed without using bedrails?: A Little Help needed moving from lying on your back to sitting on the side of a flat bed without using bedrails?: A Little Help needed moving to and from a bed to a chair (including a wheelchair)?: A Little Help needed standing up from a chair using your arms (e.g., wheelchair or bedside chair)?: A Little Help needed to walk in hospital room?: A Lot Help needed climbing 3-5 steps with a railing? : Total 6 Click Score: 15    End of Session   Activity Tolerance: Patient tolerated treatment well Patient left: in bed;with call bell/phone within reach;with bed alarm set Nurse Communication: Mobility status PT Visit Diagnosis: Unsteadiness on feet (R26.81);Other abnormalities of gait and mobility (R26.89);Difficulty in walking, not elsewhere classified (R26.2);Other symptoms and signs involving the nervous system (R29.898)    Time: 8845-8779 PT Time Calculation (min) (ACUTE ONLY): 26 min   Charges:   PT Evaluation $PT Eval Moderate Complexity: 1 Mod PT Treatments $Gait Training: 8-22 mins PT General Charges $$ ACUTE PT VISIT: 1 Visit         Theo Ferretti, PT, DPT Acute Rehabilitation Services  Office: [phone]   [name] 08/29/2024, 12:31 PM

## 2024-08-29 NOTE — Progress Notes (Signed)
 Called MRI to coordinate scan tonight since pt will be getting ativan to complete it.  Per MRI tech, he will reach out to me when there is an available time.

## 2024-08-29 NOTE — Progress Notes (Addendum)
 STROKE TEAM PROGRESS NOTE    SIGNIFICANT HOSPITAL EVENTS 11/14- Admitted with small ICH   INTERIM HISTORY/SUBJECTIVE Transfer out of ICU today and hope to discharge tomorrow. Carotid US  pending blood pressure adequately controlled off any drips.  Neurological exam stable Family at the bedside  OBJECTIVE  CBC    Component Value Date/Time   WBC 12.2 (H) 08/29/2024 0535   RBC 4.98 08/29/2024 0535   HGB 14.8 08/29/2024 0535   HCT 44.6 08/29/2024 0535   PLT 342 08/29/2024 0535   MCV 89.6 08/29/2024 0535   MCH 29.7 08/29/2024 0535   MCHC 33.2 08/29/2024 0535   RDW 12.8 08/29/2024 0535   LYMPHSABS 1.6 08/27/2024 1222   MONOABS 0.5 08/27/2024 1222   EOSABS 0.0 08/27/2024 1222   BASOSABS 0.0 08/27/2024 1222    BMET    Component Value Date/Time   NA 144 08/29/2024 0535   K 4.4 08/29/2024 0535   CL 106 08/29/2024 0535   CO2 25 08/29/2024 0535   GLUCOSE 116 (H) 08/29/2024 0535   BUN 28 (H) 08/29/2024 0535   CREATININE 2.19 (H) 08/29/2024 0535   CALCIUM  9.4 08/29/2024 0535   EGFR 33.0 05/12/2024 0000   GFRNONAA 25 (L) 08/29/2024 0535    IMAGING past 24 hours ECHOCARDIOGRAM COMPLETE Result Date: 08/28/2024    ECHOCARDIOGRAM REPORT   Patient Name:   Rhonda Cortez Date of Exam: 08/28/2024 Medical Rec #:  [id number]   Height:       63.0 in Accession #:    [id number]  Weight:       140.2 lb Date of Birth:  24-Sep-1961   BSA:          1.663 m Patient Age:    63 years    BP:           132/84 mmHg Patient Gender: F           HR:           83 bpm. Exam Location:  Inpatient Procedure: 2D Echo (Both Spectral and Color Flow Doppler were utilized during            procedure). Indications:    stroke  History:        Patient has no prior history of Echocardiogram examinations.                 Risk Factors:Hypertension and Dyslipidemia.  Sonographer:    Tinnie Barefoot RDCS Referring Phys: [phone] MCNEILL P KIRKPATRICK IMPRESSIONS  1. Left ventricular ejection fraction, by estimation, is 60 to 65%. The  left ventricle has normal function. The left ventricle has no regional wall motion abnormalities. Left ventricular diastolic parameters were normal.  2. Right ventricular systolic function is normal. The right ventricular size is normal. There is normal pulmonary artery systolic pressure. The estimated right ventricular systolic pressure is 31.1 mmHg.  3. The mitral valve is normal in structure. Trivial mitral valve regurgitation. No evidence of mitral stenosis.  4. The aortic valve is tricuspid. Aortic valve regurgitation is not visualized. No aortic stenosis is present.  5. The inferior vena cava is normal in size with greater than 50% respiratory variability, suggesting right atrial pressure of 3 mmHg. FINDINGS  Left Ventricle: Left ventricular ejection fraction, by estimation, is 60 to 65%. The left ventricle has normal function. The left ventricle has no regional wall motion abnormalities. The left ventricular internal cavity size was normal in size. There is  no left ventricular hypertrophy. Left ventricular diastolic parameters were normal.  Right Ventricle: The right ventricular size is normal. No increase in right ventricular wall thickness. Right ventricular systolic function is normal. There is normal pulmonary artery systolic pressure. The tricuspid regurgitant velocity is 2.65 m/s, and  with an assumed right atrial pressure of 3 mmHg, the estimated right ventricular systolic pressure is 31.1 mmHg. Left Atrium: Left atrial size was normal in size. Right Atrium: Right atrial size was normal in size. Pericardium: There is no evidence of pericardial effusion. Mitral Valve: The mitral valve is normal in structure. Trivial mitral valve regurgitation. No evidence of mitral valve stenosis. Tricuspid Valve: The tricuspid valve is normal in structure. Tricuspid valve regurgitation is trivial. Aortic Valve: The aortic valve is tricuspid. Aortic valve regurgitation is not visualized. No aortic stenosis is present.  Pulmonic Valve: The pulmonic valve was not well visualized. Pulmonic valve regurgitation is trivial. Aorta: The aortic root is normal in size and structure. Venous: The inferior vena cava is normal in size with greater than 50% respiratory variability, suggesting right atrial pressure of 3 mmHg. IAS/Shunts: The interatrial septum was not well visualized.  LEFT VENTRICLE PLAX 2D LVIDd:         3.80 cm   Diastology LVIDs:         2.60 cm   LV e' medial:    10.20 cm/s LV PW:         0.90 cm   LV E/e' medial:  10.5 LV IVS:        0.80 cm   LV e' lateral:   9.14 cm/s LVOT diam:     1.80 cm   LV E/e' lateral: 11.7 LV SV:         53 LV SV Index:   32 LVOT Area:     2.54 cm LV IVRT:       67 msec  RIGHT VENTRICLE             IVC RV Basal diam:  2.40 cm     IVC diam: 1.80 cm RV S prime:     12.60 cm/s TAPSE (M-mode): 1.6 cm LEFT ATRIUM             Index        RIGHT ATRIUM          Index LA diam:        2.70 cm 1.62 cm/m   RA Area:     9.32 cm LA Vol (A2C):   27.6 ml 16.60 ml/m  RA Volume:   19.60 ml 11.79 ml/m LA Vol (A4C):   28.9 ml 17.38 ml/m LA Biplane Vol: 29.0 ml 17.44 ml/m  AORTIC VALVE LVOT Vmax:   109.00 cm/s LVOT Vmean:  69.700 cm/s LVOT VTI:    0.210 m  AORTA Ao Root diam: 2.40 cm MITRAL VALVE                TRICUSPID VALVE MV Area (PHT): 5.02 cm     TR Peak grad:   28.1 mmHg MV Decel Time: 151 msec     TR Vmax:        265.00 cm/s MV E velocity: 107.00 cm/s MV A velocity: 109.00 cm/s  SHUNTS MV E/A ratio:  0.98         Systemic VTI:  0.21 m                             Systemic Diam: 1.80 cm Lonni Nanas MD Electronically signed by  Lonni Nanas MD Signature Date/Time: 08/28/2024/2:47:28 PM    Final     Vitals:   08/29/24 0600 08/29/24 0700 08/29/24 0758 08/29/24 0800  BP: 128/78 115/76  135/86  Pulse: 92 77  89  Resp: 12 14  19   Temp:   98.9 F (37.2 C)   TempSrc:   Axillary   SpO2: 96% 95%  93%  Weight:      Height:         PHYSICAL EXAM General:  Alert, well-nourished,  well-developed patient in no acute distress Psych:  Mood and affect appropriate for situation CV: Regular rate and rhythm on monitor Respiratory:  Regular, unlabored respirations on room air GI: Abdomen soft and nontender   NEURO:  Mental Status: Alert and oriented to self, place, situation. Incorrect age  Speech/Language: speech is without dysarthria  Mild difficulty with wording finding, inappropriate word substitution, mild difficulty with repetition  Able to spell world, incorrect backwards  Cranial Nerves:  II: PERRL. Visual fields full.  III, IV, VI: EOMI. Eyelids elevate symmetrically.  V: Sensation is intact to light touch and symmetrical to face.  VII: Face is symmetrical resting and smiling VIII: hearing intact to voice. IX, X: Palate elevates symmetrically. Phonation is normal.  KP:Dynloizm shrug 5/5. XII: tongue is midline without fasciculations. Motor: 5/5 strength to all muscle groups tested.  Tone: is normal and bulk is normal Sensation- Intact to light touch bilaterally. Extinction absent to light touch to DSS.   Coordination: FTN intact bilaterally, HKS: no ataxia in BLE.No drift.  Gait- deferred  Most Recent NIH 2     ASSESSMENT/PLAN  [name] is a 63 y.o. female with history of CKD and hypertension who presents with confusion and found to have a small head of the caudate hemorrhage.  NIH on Admission 1  ICH:  Left caudate head CH with IVH, likely etiology:  hypertensive, but need to rule out AVM CT head -acute intracranial hemorrhage with significant intraventricular extension from an acute bleed in the left caudate nucleus.  Mild asymmetric enlargement of the left lateral ventricle due to blood; temporal horns remain diminutive with trace rightward midline shift Repeat CT head - Caudate head hemorrhage with intraventricular extension  MRI - Focal area of intraparenchymal hemorrhage within the left caudate measuring approximately 1.7 x 1.1 x 1.9 cm, as  seen on prior CT.  Intraventricular hemorrhage, primarily within the left lateral ventricle, but also layering independently within the posterior horn of the right lateral ventricle and within the fourth ventricle, as seen on prior CT. No evidence of underlying mass. Extensive cerebral white matter disease. Small focus of hemosiderin staining present within each frontal lobe and within the left anteromedial cerebellar hemisphere. 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 Mild stenosis of the ophthalmic segments of the internal carotid arteries bilaterally. Mild-to-moderate focal stenosis of the mid basilar artery, approximately 30 to 40%.  2D Echo EF 60 to 65% Carotid US  -not done as patient uncooperative  LDL 164 HgbA1c 5.8 UDS negative VTE prophylaxis - SCDs  No antithrombotic prior to admission, now on No antithrombotic due to ICH Therapy recommendations:  Pending Disposition:  Pending   Hx of Stroke/TIA Stroke in 2014 - arm and leg weakness, difficulty walking Was on ASA after stroke for a number of years and then off No significant residual deficit  Hypertension  Home meds:  Norvasc, valsartan  Resume  norvasc  Stable BP goal less than 160 Long-term BP goal normotensive  Hyperlipidemia Home meds:  pitavastatin  LDL 164, goal < 70 Start atorvastatin 40mg   CKD 3a Cr 1.97 - 1.9 - 2.19 GFR 28 500cc LR x1 Repeat in the morning  Valsartan  on hold   Other Active Problems   Hospital day # 2  Patient seen and examined by NP/APP with MD. MD to update note as needed.   Jorene Last, DNP, FNP-BC [facility] Pager: [phone]  I have personally obtained history,examined this patient, reviewed notes, independently viewed imaging studies, participated in medical decision making and plan of care.ROS completed by me personally and pertinent positives fully documented  I have  made any additions or clarifications directly to the above note. Agree with note above.  Patient neurological exam appears stable and blood pressure adequately controlled on off Cleviprex drip.  Mobilize out of bed.  Continue ongoing therapies.  Transfer to neurology floor bed.  May consider discharge once cleared by therapy in the next 1 to 2 days.   I personally spent a total of 50 minutes in the care of the patient today including getting/reviewing separately obtained history, performing a medically appropriate exam/evaluation, counseling and educating, placing orders, referring and communicating with other health care professionals, documenting clinical information in the EHR, independently interpreting results, and coordinating care.         Eather Popp, MD Medical Director [facility] Pager: [phone] 08/29/2024 5:15 PM  To contact Stroke Continuity provider, please refer to [email or web address]. After hours, contact General Neurology

## 2024-08-30 ENCOUNTER — Inpatient Hospital Stay (HOSPITAL_COMMUNITY)

## 2024-08-30 DIAGNOSIS — I639 Cerebral infarction, unspecified: Secondary | ICD-10-CM | POA: Diagnosis not present

## 2024-08-30 DIAGNOSIS — I610 Nontraumatic intracerebral hemorrhage in hemisphere, subcortical: Secondary | ICD-10-CM | POA: Diagnosis not present

## 2024-08-30 LAB — BASIC METABOLIC PANEL WITH GFR
Anion gap: 13 (ref 5–15)
BUN: 26 mg/dL — ABNORMAL HIGH (ref 8–23)
CO2: 22 mmol/L (ref 22–32)
Calcium: 8.8 mg/dL — ABNORMAL LOW (ref 8.9–10.3)
Chloride: 103 mmol/L (ref 98–111)
Creatinine, Ser: 1.95 mg/dL — ABNORMAL HIGH (ref 0.44–1.00)
GFR, Estimated: 29 mL/min — ABNORMAL LOW (ref >60)
Glucose, Bld: 183 mg/dL — ABNORMAL HIGH (ref 70–99)
Potassium: 3.7 mmol/L (ref 3.5–5.1)
Sodium: 138 mmol/L (ref 135–145)

## 2024-08-30 LAB — CBC
HCT: 43.2 % (ref 36.0–46.0)
Hemoglobin: 14.3 g/dL (ref 12.0–15.0)
MCH: 29.2 pg (ref 26.0–34.0)
MCHC: 33.1 g/dL (ref 30.0–36.0)
MCV: 88.2 fL (ref 80.0–100.0)
Platelets: 326 K/uL (ref 150–400)
RBC: 4.9 MIL/uL (ref 3.87–5.11)
RDW: 12.5 % (ref 11.5–15.5)
WBC: 12.2 K/uL — ABNORMAL HIGH (ref 4.0–10.5)
nRBC: 0 % (ref 0.0–0.2)

## 2024-08-30 NOTE — Plan of Care (Signed)
  Problem: Education: Goal: Knowledge of disease or condition will improve Outcome: Progressing Goal: Knowledge of secondary prevention will improve (MUST DOCUMENT ALL) Outcome: Progressing Goal: Knowledge of patient specific risk factors will improve (DELETE if not current risk factor) Outcome: Progressing   Problem: Intracerebral Hemorrhage Tissue Perfusion: Goal: Complications of Intracerebral Hemorrhage will be minimized Outcome: Progressing   Problem: Self-Care: Goal: Ability to participate in self-care as condition permits will improve Outcome: Progressing

## 2024-08-30 NOTE — PMR Pre-admission (Signed)
 PMR Admission Coordinator Pre-Admission Assessment  Patient: Rhonda Cortez is an 63 y.o., female MRN: [id number] DOB: 22-Mar-1961 Height: 5' 3 (160 cm) Weight: 63.6 kg  Insurance Information HMO: ***    PPO: ***     PCP: ***     IPA: ***     80/20: ***     OTHER: *** PRIMARY: ***      Policy#: ***      Subscriber: *** CM Name: ***      Phone#: ***     Fax#: *** Pre-Cert#: ***      Employer: *** Benefits:  Phone #: ***     Name: *** Eff. Date: ***     Deduct: ***      Out of Pocket Max: ***      Life Max: *** CIR: ***      SNF: *** Outpatient: ***     Co-Pay: *** Home Health: ***      Co-Pay: *** DME: ***     Co-Pay: *** Providers: *** SECONDARY: ***      Policy#: ***     Phone#: ***  Financial Counselor: ***      Phone#: ***  The "Data Collection Information Summary" for patients in Inpatient Rehabilitation Facilities with attached "Privacy Act Statement-Health Care Records" was provided and verbally reviewed with: {CHL IP Patient Family WJ:695449998}  Emergency Contact Information Contact Information     Name Relation Home Work Mobile   [place] Son   [phone]   Metta Boyce Ahumada [phone]        Other Contacts   None on File     Current Medical History  Patient Admitting Diagnosis: *** History of Present Illness: *** Complete NIHSS TOTAL: 1  Patient's medical record from *** has been reviewed by the rehabilitation admission coordinator and physician.  Past Medical History  Past Medical History:  Diagnosis Date   Hypertension    Stroke [facility])     Has the patient had major surgery during 100 days prior to admission? {Yes/No/Unknown:304600602}  Family History   family history includes Asthma in her son; Diabetes in her mother; Hypertension in her brother, father, and sister; Kidney disease in her father.  Current Medications  Current Facility-Administered Medications:    acetaminophen (TYLENOL) tablet 650 mg, 650 mg, Oral, Q4H PRN, 650 mg at  08/27/24 2304 **OR** acetaminophen (TYLENOL) 160 MG/5ML solution 650 mg, 650 mg, Per Tube, Q4H PRN **OR** acetaminophen (TYLENOL) suppository 650 mg, 650 mg, Rectal, Q4H PRN, Michaela Aisha SHAUNNA, MD   amLODipine (NORVASC) tablet 10 mg, 10 mg, Oral, Daily, Michaela Aisha SHAUNNA, MD, 10 mg at 08/30/24 0950   atorvastatin (LIPITOR) tablet 40 mg, 40 mg, Oral, Daily, Sethi, Pramod S, MD, 40 mg at 08/30/24 0950   labetalol (NORMODYNE) injection 10 mg, 10 mg, Intravenous, Q10 min PRN, Michaela Aisha SHAUNNA, MD   ondansetron (ZOFRAN) injection 4 mg, 4 mg, Intravenous, Q6H PRN, Khaliqdina, Salman, MD   Oral care mouth rinse, 15 mL, Mouth Rinse, PRN, Arora, Ashish, MD   pantoprazole (PROTONIX) EC tablet 40 mg, 40 mg, Oral, QHS, Uhlorn, Garett M, RPH, 40 mg at 08/28/24 2105   senna-docusate (Senokot-S) tablet 1 tablet, 1 tablet, Oral, BID, Michaela Aisha SHAUNNA, MD, 1 tablet at 08/30/24 9048  Patients Current Diet:  Diet Order             Diet regular Room service appropriate? Yes; Fluid consistency: Thin  Diet effective now  Precautions / Restrictions Precautions Precautions: Fall, Other (comment) Precaution/Restrictions Comments: SBP < 160 Restrictions Weight Bearing Restrictions Per Provider Order: No   Has the patient had 2 or more falls or a fall with injury in the past year? {Yes/No/Unknown:304600602}  Prior Activity Level Community (5-7x/wk): fully independent, no DME, driving, working  Prior Functional Level Self Care: Did the patient need help bathing, dressing, using the toilet or eating? {Prior Functional Ozczo:695399399}  Indoor Mobility: Did the patient need assistance with walking from room to room (with or without device)? {Prior Functional Ozczo:695399399}  Stairs: Did the patient need assistance with internal or external stairs (with or without device)? {Prior Functional Ozczo:695399399}  Functional Cognition: Did the patient need help planning  regular tasks such as shopping or remembering to take medications? {Prior Functional Ozczo:695399399}  Patient Information Are you of Hispanic, Latino/a,or Spanish origin?: A. No, not of Hispanic, Latino/a, or Spanish origin What is your race?: B. Black or African American Do you need or want an interpreter to communicate with a doctor or health care staff?: 0. No  Patient's Response To:  Health Literacy and Transportation Is the patient able to respond to health literacy and transportation needs?: Yes Health Literacy - How often do you need to have someone help you when you read instructions, pamphlets, or other written material from your doctor or pharmacy?: Never In the past 12 months, has lack of transportation kept you from medical appointments or from getting medications?: No In the past 12 months, has lack of transportation kept you from meetings, work, or from getting things needed for daily living?: No  Journalist, Newspaper / Equipment Home Equipment: Shower seat, Grab bars - tub/shower, Hand held shower head  Prior Device Use: Indicate devices/aids used by the patient prior to current illness, exacerbation or injury? {Prior Device Ldzi:695399398}  Current Functional Level Cognition  Arousal/Alertness: Awake/alert Overall Cognitive Status: Impaired/Different from baseline Orientation Level: Oriented X4 Attention: Selective Selective Attention: Impaired Selective Attention Impairment: Verbal complex Memory: Impaired Memory Impairment: Storage deficit, Decreased recall of new information Awareness: Impaired Awareness Impairment: Intellectual impairment Problem Solving: Impaired Problem Solving Impairment: Verbal basic Executive Function: Initiating Initiating: Impaired Initiating Impairment: Verbal basic, Functional basic    Extremity Assessment (includes Sensation/Coordination)  Upper Extremity Assessment: RUE deficits/detail, LUE deficits/detail RUE Deficits /  Details: pt reports prior CVA resulted in R sided weakness, overall WFL during session, 4/5 push/shoulder flexion. LUE Deficits / Details: mildly decreased coordination with finger to nose testing  Lower Extremity Assessment: Defer to PT evaluation RLE Deficits / Details: reports prior CVA resulted in R-sided weakness and endorses residual deficits but MMT scores symmetrical bil with 4+ hip flexion, 5 knee extension, 5 ankle dorsiflexion; sensation intact bil; dysdiadochokinesia and dysmetria noted on R; delayed but accurate proprioception in ankle RLE Sensation: decreased proprioception RLE Coordination: decreased fine motor, decreased gross motor    ADLs  Overall ADL's : Needs assistance/impaired Eating/Feeding: Set up, Sitting Grooming: Minimal assistance, Standing Upper Body Bathing: Set up, Sitting Lower Body Bathing: Minimal assistance, Sitting/lateral leans, Sit to/from stand Upper Body Dressing : Set up, Sitting Lower Body Dressing: Minimal assistance, Sitting/lateral leans, Sit to/from stand Toilet Transfer: Minimal assistance, Ambulation, Stand-pivot, BSC/3in1 Functional mobility during ADLs: Minimal assistance    Mobility  Overal bed mobility: Needs Assistance Bed Mobility: Sit to Supine Sit to supine: Supervision General bed mobility comments: Supervision for safety returning to supine from sitting EOB    Transfers  Overall transfer level: Needs assistance Equipment used: None Transfers:  Sit to/from Stand Sit to Stand: Min assist General transfer comment: Pt leaning her legs posteriorly against wheelchair for support with sit to stand, minA for balance and cues to shift weight anteriorly    Ambulation / Gait / Stairs / Wheelchair Mobility  Ambulation/Gait Ambulation/Gait assistance: Min assist, Mod assist, Contact guard assist Gait Distance (Feet): 110 Feet Assistive device: None, Rolling walker (2 wheels) Gait Pattern/deviations: Step-through pattern, Decreased  dorsiflexion - right, Decreased stride length, Staggering right, Drifts right/left, Narrow base of support, Decreased step length - right, Decreased step length - left General Gait Details: Pt ambulates with slow, small, narrow bil steps with slightly decreased R dorsiflexion during swing phase. Pt has a tendency to drift and stagger to the R with and without the RW. Verbal and tactile cues provided to widen stance. Pt also would sway and stagger posteriorly intermittently. Min-modA to ambulate ~6 ft without UE support. CGA-minA to ambulate with RW the remaining distance. Gait velocity: reduced Gait velocity interpretation: <1.31 ft/sec, indicative of household ambulator    Posture / Balance Dynamic Sitting Balance Sitting balance - Comments: posterior lean with dynamic tasks, supervision sitting statically Balance Overall balance assessment: Needs assistance Sitting-balance support: No upper extremity supported, Feet supported Sitting balance-Leahy Scale: Fair Sitting balance - Comments: posterior lean with dynamic tasks, supervision sitting statically Postural control: Posterior lean, Right lateral lean Standing balance support: Bilateral upper extremity supported, No upper extremity supported, During functional activity Standing balance-Leahy Scale: Poor Standing balance comment: reliant on RW and staggers R and posteriorly with minA to recover when using RW, up to modA to recover without UE support    Special considerations/life events  {Special Care Needs/Care Considerations:304600603}   Previous Home Environment (from acute therapy documentation) Living Arrangements: Alone  Lives With: Alone Available Help at Discharge: Family, Available 24 hours/day Type of Home: House Home Layout: One level Home Access: Stairs to enter Entrance Stairs-Rails: None Secretary/administrator of Steps: 2 Bathroom Shower/Tub: Hydrographic surveyor, Health Visitor: Standard Home Care Services:  No  Discharge Living Setting Plans for Discharge Living Setting: Patient's home, Lives with (comment) (family to come stay with her) Type of Home at Discharge: House Discharge Home Layout: One level Discharge Home Access: Stairs to enter Entrance Stairs-Rails: Right, Left Entrance Stairs-Number of Steps: 8+ Discharge Bathroom Shower/Tub: Tub/shower unit, Walk-in shower Discharge Bathroom Toilet: Standard Discharge Bathroom Accessibility: Yes How Accessible: Accessible via walker Does the patient have any problems obtaining your medications?: No  Social/Family/Support Systems Anticipated Caregiver: children to rotate staying with her Anticipated Caregiver's Contact Information: Chance [phone] Ability/Limitations of Caregiver: none stated Caregiver Availability: 24/7 Discharge Plan Discussed with Primary Caregiver: Yes Is Caregiver In Agreement with Plan?: Yes Does Caregiver/Family have Issues with Lodging/Transportation while Pt is in Rehab?: No  Goals Patient/Family Goal for Rehab: PT/OT/SLP supervision to mod I Expected length of stay: 7-10 days Additional Information: Discharge plan: pt to return to her own home, her children (2 sons, a daughter, and daughter in law) will rotate to stay with her at discharge to ensure she has 24/7 Pt/Family Agrees to Admission and willing to participate: Yes Program Orientation Provided & Reviewed with Pt/Caregiver Including Roles  & Responsibilities: Yes  Decrease burden of Care through IP rehab admission: {Inpatient Rehab Care:20780}  Possible need for SNF placement upon discharge: ***  Patient Condition: {PATIENT'S CONDITION:22832}  Preadmission Screen Completed By:  Reche FORBES Lowers, 08/30/2024 3:13 PM ______________________________________________________________________   Discussed status with Dr. PIERRETTE on *** at *** and received  approval for admission today.  Admission Coordinator:  Caitlin E Warren, PT, time PIERRETTEPattricia ***    Assessment/Plan: Diagnosis: *** Does the need for close, 24 hr/day Medical supervision in concert with the patient's rehab needs make it unreasonable for this patient to be served in a less intensive setting? {yes_no_potentially:3041433} Co-Morbidities requiring supervision/potential complications: *** Due to {due un:6958565}, does the patient require 24 hr/day rehab nursing? {yes_no_potentially:3041433} Does the patient require coordinated care of a physician, rehab nurse, PT, OT, and SLP to address physical and functional deficits in the context of the above medical diagnosis(es)? {yes_no_potentially:3041433} Addressing deficits in the following areas: {deficits:3041436} Can the patient actively participate in an intensive therapy program of at least 3 hrs of therapy 5 days a week? {yes_no_potentially:3041433} The potential for patient to make measurable gains while on inpatient rehab is {potential:3041437} Anticipated functional outcomes upon discharge from inpatient rehab: {functional outcomes:304600100} PT, {functional outcomes:304600100} OT, {functional outcomes:304600100} SLP Estimated rehab length of stay to reach the above functional goals is: *** Anticipated discharge destination: {anticipated dc setting:21604} 10. Overall Rehab/Functional Prognosis: {potential:3041437}   MD Signature: ***

## 2024-08-30 NOTE — Consult Note (Signed)
 Physical Medicine and Rehabilitation Consult Reason for Consult: Deficits in functional mobility as well as confusion Referring Physician: Rosemarie   HPI: Rhonda Cortez is a 63 y.o. female with a history of CKD, prior stroke in 2014, and hypertension who presented on 08/27/2024 with confusion.  CT of the head revealed a acute intracranial hemorrhage without significant intraventricular extension in the left caudate nucleus.  MRI ultimately revealed a focal area of intraparenchymal hemorrhage within the left caudate measuring 1.7 x 1.1 x 1.9 cm consistent with prior CT along with intraventricular hemorrhage primarily within the left lateral ventricle and within the fourth ventricle.  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  Mild to moderate focal stenosis of the mid basilar artery.  No antithrombotics due to hemorrhage.  Other active medical issues include hypertension, CKD 3A.  Therapy mobilized the patient yesterday and she was min assist for sit to stand transfers and min to mod assist with gait using a rolling walker for 110 feet.  Patient lives alone in a 1 level house with 2 steps to enter.  She has some family who can assist.  Patient was independent and driving prior to this admission.    Home: Home Living Family/patient expects to be discharged to:: Inpatient rehab Living Arrangements: Alone Available Help at Discharge: Family, Available 24 hours/day Type of Home: House Home Access: Stairs to enter [employer] of Steps: 2 Entrance Stairs-Rails: None Home Layout: One level Bathroom Shower/Tub: Tub/shower unit, Health Visitor: Standard Home Equipment: Information systems manager, Grab bars - tub/shower, Hand held shower head  Lives With: Alone  Functional History: Prior Function Prior Level of Function : Independent/Modified Independent, Driving, Working/employed Mobility Comments: No AD ADLs Comments: 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 Independent in ADL, IADL, working, driving Functional Status:  Mobility: Bed Mobility Overal bed mobility: Needs Assistance Bed Mobility: Sit to Supine Sit to supine: Supervision General bed mobility comments: Supervision for safety returning to supine from sitting EOB Transfers Overall transfer level: Needs assistance Equipment used: None Transfers: Sit to/from Stand Sit to Stand: Min assist General transfer comment: Pt leaning her legs posteriorly against wheelchair for support with sit to stand, minA for balance and cues to shift weight anteriorly Ambulation/Gait Ambulation/Gait assistance: Min assist, Mod assist, Contact guard assist Gait Distance (Feet): 110 Feet Assistive device: None, Rolling walker (2 wheels) Gait Pattern/deviations: Step-through pattern, Decreased dorsiflexion - right, Decreased stride length, Staggering right, Drifts right/left, Narrow base of support, Decreased step length - right, Decreased step length - left General Gait Details: Pt ambulates with slow, small, narrow bil steps with slightly decreased R dorsiflexion during swing phase. Pt has a tendency to drift and stagger to the R with and without the RW. Verbal and tactile cues provided to widen stance. Pt also would sway and stagger posteriorly intermittently. Min-modA to ambulate ~6 ft without UE support. CGA-minA to ambulate with RW the remaining distance. Gait velocity: reduced Gait velocity interpretation: <1.31 ft/sec, indicative of household ambulator    ADL: ADL Overall ADL's : Needs assistance/impaired Eating/Feeding: Set up, Sitting Grooming: Minimal assistance, Standing Upper Body Bathing: Set up, Sitting Lower Body Bathing: Minimal assistance, Sitting/lateral leans, Sit to/from stand Upper Body Dressing : Set up, Sitting Lower Body Dressing: Minimal assistance, Sitting/lateral leans, Sit to/from stand Toilet Transfer: Minimal assistance, Ambulation, Stand-pivot,  BSC/3in1 Functional mobility during ADLs: Minimal assistance  Cognition: Cognition Overall Cognitive Status: Impaired/Different  from baseline Arousal/Alertness: Awake/alert Orientation Level: Oriented X4 Year: Other (Comment) (94 or 95) Month: November Day of Week: Correct Attention: Selective Selective Attention: Impaired Selective Attention Impairment: Verbal complex Memory: Impaired Memory Impairment: Storage deficit, Decreased recall of new information Awareness: Impaired Awareness Impairment: Intellectual impairment Problem Solving: Impaired Problem Solving Impairment: Verbal basic Executive Function: Initiating Initiating: Impaired Initiating Impairment: Verbal basic, Functional basic Cognition Arousal: Alert Behavior During Therapy: WFL for tasks assessed/performed Overall Cognitive Status: Impaired/Different from baseline   Review of Systems  Unable to perform ROS: Mental acuity   Past Medical History:  Diagnosis Date   Hypertension    Stroke [facility])    Past Surgical History:  Procedure Laterality Date   OOPHORECTOMY     TUBAL LIGATION     Family History  Problem Relation Age of Onset   Diabetes Mother    Kidney disease Father    Hypertension Father    Hypertension Sister    Hypertension Brother    Asthma Son    Social History:  reports that she has never smoked. She has never used smokeless tobacco. She reports that she does not drink alcohol and does not use drugs. Allergies: No Known Allergies Medications Prior to Admission  Medication Sig Dispense Refill   amLODipine (NORVASC) 10 MG tablet Take 10 mg by mouth daily.     Cholecalciferol (VITAMIN D-3 PO) Take 1 capsule by mouth daily.     triamcinolone  ointment (KENALOG ) 0.1 % Apply 1 Application topically 2 (two) times daily as needed (skin irritation).     valsartan  (DIOVAN ) 80 MG tablet Take 1 tablet (80 mg total) by mouth daily. (Patient taking differently: Take 80 mg by mouth at bedtime.) 30  tablet 2   Pitavastatin  Calcium  1 MG TABS Take 1 tablet (1 mg total) by mouth daily. START with 1 pill Monday, Wednesday, Friday for 2 weeks, then increase to every other day for 2 weeks, then take daily. (Patient not taking: Reported on 08/28/2024) 90 tablet 0     Blood pressure 124/79, pulse 92, temperature 98.7 F (37.1 C), temperature source Oral, resp. rate 18, height 5' 3 (1.6 m), weight 63.6 kg, SpO2 99%. Physical Exam Constitutional:      General: She is not in acute distress.    Comments: Appears fatigued  HENT:     Head: Normocephalic.     Right Ear: External ear normal.     Left Ear: External ear normal.     Nose: Nose normal.     Mouth/Throat:     Mouth: Mucous membranes are moist.  Cardiovascular:     Rate and Rhythm: Normal rate.  Pulmonary:     Effort: Pulmonary effort is normal.  Abdominal:     Palpations: Abdomen is soft.  Musculoskeletal:        General: No swelling or tenderness.     Cervical back: Normal range of motion.  Skin:    General: Skin is warm.  Neurological:     Comments: Pt was slow to arouse. Oriented to person, place, stroke. Speech is low volume fairly clear. Pt with occasional word finding deficits, processing delays for simple tasks. CN exam is non-focal. Sensed pain in all 4 limbs. Difficult to perform MMT due to level of arousal this afternoon. No abnl tone witness. No limb ataxia   Psychiatric:     Comments: Flat and disengaged     Results for orders placed or performed during the hospital encounter of 08/27/24 (from the past 24 hours)  Basic metabolic panel with GFR     Status: Abnormal   Collection Time: 08/30/24  1:44 AM  Result Value Ref Range   Sodium 138 135 - 145 mmol/L   Potassium 3.7 3.5 - 5.1 mmol/L   Chloride 103 98 - 111 mmol/L   CO2 22 22 - 32 mmol/L   Glucose, Bld 183 (H) 70 - 99 mg/dL   BUN 26 (H) 8 - 23 mg/dL   Creatinine, Ser 8.04 (H) 0.44 - 1.00 mg/dL   Calcium  8.8 (L) 8.9 - 10.3 mg/dL   GFR, Estimated 29 (L)  >60 mL/min   Anion gap 13 5 - 15  CBC     Status: Abnormal   Collection Time: 08/30/24  1:44 AM  Result Value Ref Range   WBC 12.2 (H) 4.0 - 10.5 K/uL   RBC 4.90 3.87 - 5.11 MIL/uL   Hemoglobin 14.3 12.0 - 15.0 g/dL   HCT 56.7 63.9 - 53.9 %   MCV 88.2 80.0 - 100.0 fL   MCH 29.2 26.0 - 34.0 pg   MCHC 33.1 30.0 - 36.0 g/dL   RDW 87.4 88.4 - 84.4 %   Platelets 326 150 - 400 K/uL   nRBC 0.0 0.0 - 0.2 %   VAS US  CAROTID Result Date: 08/30/2024 Carotid Arterial Duplex Study Patient Name:  ROSEMARIE GALVIS  Date of Exam:   08/30/2024 Medical Rec #: [id number]    Accession #:    [id number] Date of Birth: 1961-09-22    Patient Gender: F Patient Age:   24 years Exam Location:  [facility] Procedure:      VAS US  CAROTID Referring Phys: ARY XU --------------------------------------------------------------------------------  Indications:       CVA. Risk Factors:      Hypertension, hyperlipidemia. Comparison Study:  No prior studies. Performing Technologist: Cordella Collet RVT  Examination Guidelines: A complete evaluation includes B-mode imaging, spectral Doppler, color Doppler, and power Doppler as needed of all accessible portions of each vessel. Bilateral testing is considered an integral part of a complete examination. Limited examinations for reoccurring indications may be performed as noted.  Right Carotid Findings: +----------+--------+--------+--------+-----------------------+--------+           PSV cm/sEDV cm/sStenosisPlaque Description     Comments +----------+--------+--------+--------+-----------------------+--------+ CCA Prox  73      12              smooth and heterogenous         +----------+--------+--------+--------+-----------------------+--------+ CCA Distal66      20              smooth and heterogenous         +----------+--------+--------+--------+-----------------------+--------+ ICA Prox  43      13                                               +----------+--------+--------+--------+-----------------------+--------+ ICA Mid   40      16                                              +----------+--------+--------+--------+-----------------------+--------+ ICA Distal55      21  tortuous +----------+--------+--------+--------+-----------------------+--------+ ECA       79      12                                              +----------+--------+--------+--------+-----------------------+--------+ +----------+--------+-------+--------+-------------------+           PSV cm/sEDV cmsDescribeArm Pressure (mmHG) +----------+--------+-------+--------+-------------------+ [id number]                                         +----------+--------+-------+--------+-------------------+ +---------+--------+--+--------+-+---------+ VertebralPSV cm/s24EDV cm/s6Antegrade +---------+--------+--+--------+-+---------+  Left Carotid Findings: +----------+--------+--------+--------+-----------------------+--------+           PSV cm/sEDV cm/sStenosisPlaque Description     Comments +----------+--------+--------+--------+-----------------------+--------+ CCA Prox  86      21              smooth and heterogenous         +----------+--------+--------+--------+-----------------------+--------+ CCA Distal58      17              smooth and heterogenous         +----------+--------+--------+--------+-----------------------+--------+ ICA Prox  28      13                                              +----------+--------+--------+--------+-----------------------+--------+ ICA Mid   55      26                                              +----------+--------+--------+--------+-----------------------+--------+ ICA Distal38      19                                     tortuous +----------+--------+--------+--------+-----------------------+--------+ ECA       67      12                                               +----------+--------+--------+--------+-----------------------+--------+ +----------+--------+--------+--------+-------------------+           PSV cm/sEDV cm/sDescribeArm Pressure (mmHG) +----------+--------+--------+--------+-------------------+ Subclavian118                                         +----------+--------+--------+--------+-------------------+ +---------+--------+--+--------+-+---------+ VertebralPSV cm/s24EDV cm/s7Antegrade +---------+--------+--+--------+-+---------+   Summary: Right Carotid: Velocities in the right ICA are consistent with a 1-39% stenosis. Left Carotid: Velocities in the left ICA are consistent with a 1-39% stenosis. Vertebrals: Bilateral vertebral arteries demonstrate antegrade flow. *See table(s) above for measurements and observations.  Electronically signed by Eather Popp MD on 08/30/2024 at 1:27:18 PM.    Final    MR ANGIO HEAD WO CONTRAST Result Date: 08/29/2024 EXAM: MR Angiography Head without intravenous Contrast. 08/29/2024 11:54:00 AM TECHNIQUE: Magnetic resonance angiography images of the head without intravenous contrast. Multiplanar 2D and 3D reformatted images  are provided for review. COMPARISON: None provided. CLINICAL HISTORY: Stroke, hemorrhagic. FINDINGS: ANTERIOR CIRCULATION: There is mild stenosis of the ophthalmic segments of the internal carotid arteries bilaterally. No significant stenosis of the anterior cerebral arteries. 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 There is also severe stenosis of the proximal M2 branches of the left middle cerebral artery. No aneurysm. POSTERIOR CIRCULATION: No significant stenosis of the posterior cerebral arteries. There is mild-to-moderate focal stenosis of the mid basilar artery, approximately 30 to 40%. No significant stenosis of the vertebral arteries. No aneurysm. IMPRESSION: 1. 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 2. Mild stenosis of the ophthalmic segments of the internal carotid arteries bilaterally. 3. Mild-to-moderate focal stenosis of the mid basilar artery, approximately 30 to 40%. Electronically signed by: Evalene Coho MD 08/29/2024 12:11 PM EST RP Workstation: [id number]   MR BRAIN WO CONTRAST Result Date: 08/29/2024 EXAM: MRI BRAIN WITHOUT CONTRAST 08/29/2024 11:54:00 AM TECHNIQUE: Multiplanar multisequence MRI of the head/brain was performed without the administration of intravenous contrast. COMPARISON: CT of the head dated 08/28/2024. CLINICAL HISTORY: Stroke, hemorrhagic. FINDINGS: BRAIN AND VENTRICLES: No acute infarct. Focal area of intraparenchymal hemorrhage again demonstrated within the left caudate measuring approximately 1.7 x 1.1 x 1.9 cm. Intraventricular hemorrhage again demonstrated primarily within the left lateral ventricle, but also layering independently within the posterior horn of the right lateral ventricle and within the fourth ventricle. No evidence of underlying mass. No midline shift. No hydrocephalus. Extensive cerebral white matter disease. Small focus of hemosiderin staining present within each frontal lobe and within the left anteromedial cerebellar hemisphere. The sella is unremarkable. Normal flow voids. ORBITS: No acute abnormality. SINUSES AND MASTOIDS: No acute abnormality. BONES AND SOFT TISSUES: Normal marrow signal. No acute soft tissue abnormality. IMPRESSION: 1. Focal area of intraparenchymal hemorrhage within the left caudate measuring approximately 1.7 x 1.1 x 1.9 cm, as seen on prior CT. 2. Intraventricular hemorrhage, primarily within the left lateral ventricle, but also layering independently within the posterior horn of the right lateral ventricle and within the fourth ventricle, as seen on prior CT. 3. No evidence of underlying mass. 4. Extensive cerebral white matter  disease. 5. Small focus of hemosiderin staining present within each frontal lobe and within the left anteromedial cerebellar hemisphere. Electronically signed by: Evalene Coho MD 08/29/2024 12:08 PM EST RP Workstation: [id number]    Assessment/Plan: Diagnosis: 63 year old female with left caudate head intracerebral hemorrhage with intraventricular extension Does the need for close, 24 hr/day medical supervision in concert with the patient's rehab needs make it unreasonable for this patient to be served in a less intensive setting? Yes Co-Morbidities requiring supervision/potential complications:  - Hypertension -Chronic kidney disease stage III AA -History of prior stroke Due to bladder management, bowel management, safety, skin/wound care, disease management, medication administration, and patient education, does the patient require 24 hr/day rehab nursing? Yes Does the patient require coordinated care of a physician, rehab nurse, therapy disciplines of PT, OT, SLP to address physical and functional deficits in the context of the above medical diagnosis(es)? Yes Addressing deficits in the following areas: balance, endurance, locomotion, strength, transferring, bowel/bladder control, bathing, dressing, feeding, grooming, toileting, cognition, and psychosocial support Can the patient actively participate in an intensive therapy program of at least 3 hrs of therapy per day at least 5 days per week? Yes The potential for patient to make measurable gains  while on inpatient rehab is excellent Anticipated functional outcomes upon discharge from inpatient rehab are modified independent and supervision  with PT, modified independent and supervision with OT, modified independent with SLP. Estimated rehab length of stay to reach the above functional goals is: 7-10 days Anticipated discharge destination: Home Overall Rehab/Functional Prognosis: excellent  POST ACUTE RECOMMENDATIONS: This patient's  condition is appropriate for continued rehabilitative care in the following setting: CIR Patient has agreed to participate in recommended program. N/A Note that insurance prior authorization may be required for reimbursement for recommended care.  Comment: Need to check on family supports. Pt wasn't able to provide me a clear picture this afternoon when I was in. Rehab Admissions Coordinator to follow up.       I have personally performed a face to face diagnostic evaluation of this patient. Additionally, I have examined the patient's medical record including any pertinent labs and radiographic images.    Thanks,  Arthea ONEIDA Gunther, MD 08/30/2024

## 2024-08-30 NOTE — Progress Notes (Signed)
 Carotid artery duplex has been completed. Preliminary results can be found in CV Proc through chart review.   08/30/24 11:44 AM Cathlyn Collet RVT

## 2024-08-30 NOTE — TOC Initial Note (Signed)
 Transition of Care [facility]) - Initial/Assessment Note    Patient Details  Name: Rhonda Cortez MRN: [id number] Date of Birth: 07-18-1961  [facility] CM/SW Contact:    Landry DELENA Senters, RN Phone Number: 08/30/2024, 11:53 AM  Clinical Narrative:                 CC: 11/14- Admitted with small ICH   Patient comes from home, lives alone. Son in room during CM visit. Does have support systems in place from family, who are able to provide transportation to appts and home at d/c. Plan for rehab, currently waiting for CIR to complete assessment. Patient does have 24/7 care available after CIR discharge.   CM will continue to follow.   Expected Discharge Plan:  (TBD) Barriers to Discharge: Continued Medical Work up   Patient Goals and CMS Choice            Expected Discharge Plan and Services       Living arrangements for the past 2 months: Single Family Home                                      Prior Living Arrangements/Services Living arrangements for the past 2 months: Single Family Home Lives with:: Self Patient language and need for interpreter reviewed:: Yes Do you feel safe going back to the place where you live?: Yes      Need for Family Participation in Patient Care: Yes (Comment) Care giver support system in place?: Yes (comment)   Criminal Activity/Legal Involvement Pertinent to Current Situation/Hospitalization: No - Comment as needed  Activities of Daily Living   ADL Screening (condition at time of admission) Independently performs ADLs?: Yes (appropriate for developmental age) Is the patient deaf or have difficulty hearing?: No Does the patient have difficulty seeing, even when wearing glasses/contacts?: No Does the patient have difficulty concentrating, remembering, or making decisions?: No  Permission Sought/Granted                  Emotional Assessment Appearance:: Developmentally appropriate Attitude/Demeanor/Rapport:  Engaged Affect (typically observed): Calm Orientation: : Oriented to Self, Oriented to Place, Oriented to  Time, Oriented to Situation Alcohol / Substance Use: Not Applicable Psych Involvement: No (comment)  Admission diagnosis:  ICH (intracerebral hemorrhage) (HCC) [I61.9] Intraparenchymal hemorrhage of brain (HCC) [I61.9] Patient Active Problem List   Diagnosis Date Noted   ICH (intracerebral hemorrhage) (HCC) 08/27/2024   Mixed hyperlipidemia 02/17/2024   Positive colorectal cancer screening using Cologuard test 02/17/2024   Essential (primary) hypertension 01/03/2023   History of CVA (cerebrovascular accident) 01/03/2023   Gastroesophageal reflux disease without esophagitis 01/03/2023   Constipation 01/03/2023   PCP:  Lucius Krabbe, NP Pharmacy:   [id number] - [place], [place] - [address] AT [facility] [address] [place] [address] Phone: [phone] Fax: [phone]     Social Drivers of Health (SDOH) Social History: SDOH Screenings   Food Insecurity: No Food Insecurity (08/30/2024)  Housing: Low Risk  (08/30/2024)  Transportation Needs: No Transportation Needs (08/30/2024)  Utilities: Not At Risk (08/30/2024)  Depression (PHQ2-9): Low Risk  (02/17/2024)  Financial Resource Strain: Low Risk  (02/17/2024)  Physical Activity: Insufficiently Active (02/17/2024)  Social Connections: Socially Isolated (02/17/2024)  Stress: No Stress Concern Present (02/17/2024)  Tobacco Use: Low Risk  (08/27/2024)   SDOH Interventions:     Readmission Risk  Interventions     No data to display

## 2024-08-30 NOTE — Progress Notes (Signed)
 Inpatient Rehab Admissions Coordinator:   Left a message for pt's son to discuss rehab recommendations and caregiver support.  Will follow.    Reche Lowers, PT, DPT Admissions Coordinator [phone] 08/30/24 1:16 PM

## 2024-08-30 NOTE — Progress Notes (Addendum)
 STROKE TEAM PROGRESS NOTE    SIGNIFICANT HOSPITAL EVENTS 11/14- Admitted with small ICH  11/16-transferred out of the ICU  INTERIM HISTORY/SUBJECTIVE Patient has been transferred out of the ICU and remains stable.  She is ready to go to CIR.  She has been hemodynamically stable with Tmax of 99.7.  OBJECTIVE  CBC    Component Value Date/Time   WBC 12.2 (H) 08/30/2024 0144   RBC 4.90 08/30/2024 0144   HGB 14.3 08/30/2024 0144   HCT 43.2 08/30/2024 0144   PLT 326 08/30/2024 0144   MCV 88.2 08/30/2024 0144   MCH 29.2 08/30/2024 0144   MCHC 33.1 08/30/2024 0144   RDW 12.5 08/30/2024 0144   LYMPHSABS 1.6 08/27/2024 1222   MONOABS 0.5 08/27/2024 1222   EOSABS 0.0 08/27/2024 1222   BASOSABS 0.0 08/27/2024 1222    BMET    Component Value Date/Time   NA 138 08/30/2024 0144   K 3.7 08/30/2024 0144   CL 103 08/30/2024 0144   CO2 22 08/30/2024 0144   GLUCOSE 183 (H) 08/30/2024 0144   BUN 26 (H) 08/30/2024 0144   CREATININE 1.95 (H) 08/30/2024 0144   CALCIUM  8.8 (L) 08/30/2024 0144   EGFR 33.0 05/12/2024 0000   GFRNONAA 29 (L) 08/30/2024 0144    IMAGING past 24 hours VAS US  CAROTID Result Date: 08/30/2024 Carotid Arterial Duplex Study Patient Name:  Rhonda Cortez  Date of Exam:   08/30/2024 Medical Rec #: [id number]    Accession #:    [id number] Date of Birth: 1961/05/26    Patient Gender: F Patient Age:   63 years Exam Location:  [facility] Procedure:      VAS US  CAROTID Referring Phys: ARY XU --------------------------------------------------------------------------------  Indications:       CVA. Risk Factors:      Hypertension, hyperlipidemia. Comparison Study:  No prior studies. Performing Technologist: Cordella Collet RVT  Examination Guidelines: A complete evaluation includes B-mode imaging, spectral Doppler, color Doppler, and power Doppler as needed of all accessible portions of each vessel. Bilateral testing is considered an integral part of a complete examination.  Limited examinations for reoccurring indications may be performed as noted.  Right Carotid Findings: +----------+--------+--------+--------+-----------------------+--------+           PSV cm/sEDV cm/sStenosisPlaque Description     Comments +----------+--------+--------+--------+-----------------------+--------+ CCA Prox  73      12              smooth and heterogenous         +----------+--------+--------+--------+-----------------------+--------+ CCA Distal66      20              smooth and heterogenous         +----------+--------+--------+--------+-----------------------+--------+ ICA Prox  43      13                                              +----------+--------+--------+--------+-----------------------+--------+ ICA Mid   40      16                                              +----------+--------+--------+--------+-----------------------+--------+ ICA Distal55      21  tortuous +----------+--------+--------+--------+-----------------------+--------+ ECA       79      12                                              +----------+--------+--------+--------+-----------------------+--------+ +----------+--------+-------+--------+-------------------+           PSV cm/sEDV cmsDescribeArm Pressure (mmHG) +----------+--------+-------+--------+-------------------+ [id number]                                         +----------+--------+-------+--------+-------------------+ +---------+--------+--+--------+-+---------+ VertebralPSV cm/s24EDV cm/s6Antegrade +---------+--------+--+--------+-+---------+  Left Carotid Findings: +----------+--------+--------+--------+-----------------------+--------+           PSV cm/sEDV cm/sStenosisPlaque Description     Comments +----------+--------+--------+--------+-----------------------+--------+ CCA Prox  86      21              smooth and heterogenous          +----------+--------+--------+--------+-----------------------+--------+ CCA Distal58      17              smooth and heterogenous         +----------+--------+--------+--------+-----------------------+--------+ ICA Prox  28      13                                              +----------+--------+--------+--------+-----------------------+--------+ ICA Mid   55      26                                              +----------+--------+--------+--------+-----------------------+--------+ ICA Distal38      19                                     tortuous +----------+--------+--------+--------+-----------------------+--------+ ECA       67      12                                              +----------+--------+--------+--------+-----------------------+--------+ +----------+--------+--------+--------+-------------------+           PSV cm/sEDV cm/sDescribeArm Pressure (mmHG) +----------+--------+--------+--------+-------------------+ Subclavian118                                         +----------+--------+--------+--------+-------------------+ +---------+--------+--+--------+-+---------+ VertebralPSV cm/s24EDV cm/s7Antegrade +---------+--------+--+--------+-+---------+   Summary: Right Carotid: Velocities in the right ICA are consistent with a 1-39% stenosis. Left Carotid: Velocities in the left ICA are consistent with a 1-39% stenosis. Vertebrals: Bilateral vertebral arteries demonstrate antegrade flow. *See table(s) above for measurements and observations.  Electronically signed by Eather Popp MD on 08/30/2024 at 1:27:18 PM.    Final     Vitals:   08/29/24 2344 08/30/24 0439 08/30/24 0746 08/30/24 1229  BP: (!) 139/93 131/89 139/85 124/79  Pulse: 97 (!) 101 99 92  Resp: 18  18 18   Temp: 99.4  F (37.4 C) 98.7 F (37.1 C) 99.7 F (37.6 C) 98.7 F (37.1 C)  TempSrc: Oral Oral Oral Oral  SpO2: 99% 99% 99% 99%  Weight:      Height:         PHYSICAL  EXAM General:  Alert, well-nourished, well-developed patient in no acute distress Psych:  Mood and affect appropriate for situation CV: Regular rate and rhythm on monitor Respiratory:  Regular, unlabored respirations on room air GI: Abdomen soft and nontender   NEURO:  Mental Status: Alert and oriented to self, place, situation.  Responses somewhat slow Speech/Language: speech is without dysarthria  Mild difficulty with wording finding  Cranial Nerves:  II: PERRL. Visual fields full.  III, IV, VI: EOMI. Eyelids elevate symmetrically.  V: Sensation is intact to light touch and symmetrical to face.  VII: Face is symmetrical resting and smiling VIII: hearing intact to voice. IX, X:Phonation is normal.  XII: tongue is midline without fasciculations. Motor: 5/5 strength to all muscle groups tested.  Tone: is normal and bulk is normal Sensation- Intact to light touch bilaterally. Coordination: FTN intact bilaterally Gait- deferred  Most Recent NIH 2     ASSESSMENT/PLAN  [name] is a 63 y.o. female with history of CKD and hypertension who presents with confusion and found to have a small head of the caudate hemorrhage.  NIH on Admission 1  ICH:  Left caudate head CH with IVH, likely etiology:  hypertensive, but need to rule out AVM CT head -acute intracranial hemorrhage with significant intraventricular extension from an acute bleed in the left caudate nucleus.  Mild asymmetric enlargement of the left lateral ventricle due to blood; temporal horns remain diminutive with trace rightward midline shift Repeat CT head - Caudate head hemorrhage with intraventricular extension  MRI - Focal area of intraparenchymal hemorrhage within the left caudate measuring approximately 1.7 x 1.1 x 1.9 cm, as seen on prior CT.  Intraventricular hemorrhage, primarily within the left lateral ventricle, but also layering independently within the posterior horn of the right lateral ventricle and within  the fourth ventricle, as seen on prior CT. No evidence of underlying mass. Extensive cerebral white matter disease. Small focus of hemosiderin staining present within each frontal lobe and within the left anteromedial cerebellar hemisphere. 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 Mild stenosis of the ophthalmic segments of the internal carotid arteries bilaterally. Mild-to-moderate focal stenosis of the mid basilar artery, approximately 30 to 40%.  2D Echo EF 60 to 65% Carotid US  -not done as patient uncooperative  LDL 164 HgbA1c 5.8 UDS negative VTE prophylaxis - SCDs  No antithrombotic prior to admission, now on No antithrombotic due to ICH Therapy recommendations:  Pending Disposition:  Pending   Hx of Stroke/TIA Stroke in 2014 - arm and leg weakness, difficulty walking Was on ASA after stroke for a number of years and then off No significant residual deficit  Hypertension  Home meds:  Norvasc, valsartan  Resume norvasc  Stable BP goal less than 160 Long-term BP goal normotensive  Hyperlipidemia Home meds:  pitavastatin  LDL 164, goal < 70 Start atorvastatin 40mg   CKD 3a Cr 1.97 - 1.9 - 2.19-1.95 GFR 29 Valsartan  on hold   Other Active Problems   Hospital day # 3  Patient seen and examined by NP/APP with MD. MD to update note as needed.   Cortney E Everitt Clint Kill ,  MSN, AGACNP-BC [facility] See Amion for schedule and pager information 08/30/2024 2:39 PM   I have personally obtained history,examined this patient, reviewed notes, independently viewed imaging studies, participated in medical decision making and plan of care.ROS completed by me personally and pertinent positives fully documented  I have made any additions or clarifications directly to the above note. Agree with note above.  She is doing well and is stable to be transferred to rehab when bed  available.  Eather Popp, MD Medical Director [facility] Pager: [phone] 08/30/2024 5:08 PM  To contact Stroke Continuity provider, please refer to [email or web address]. After hours, contact General Neurology

## 2024-08-30 NOTE — Progress Notes (Signed)
 Inpatient Rehab Admissions Coordinator Note:  I spoke to patients son on the phone regarding rehab recommendations. Plan is for family to rotate staying with patient so she'll have 24/7 support. We reviewed insurance auth and will start that process. Unlikely to have a bed on CIR today/tomorrow so will open tomorrow.   Reche Lowers, PT, DPT

## 2024-08-31 DIAGNOSIS — I619 Nontraumatic intracerebral hemorrhage, unspecified: Secondary | ICD-10-CM | POA: Diagnosis not present

## 2024-08-31 LAB — BASIC METABOLIC PANEL WITH GFR
Anion gap: 13 (ref 5–15)
BUN: 25 mg/dL — ABNORMAL HIGH (ref 8–23)
CO2: 22 mmol/L (ref 22–32)
Calcium: 9 mg/dL (ref 8.9–10.3)
Chloride: 103 mmol/L (ref 98–111)
Creatinine, Ser: 1.81 mg/dL — ABNORMAL HIGH (ref 0.44–1.00)
GFR, Estimated: 31 mL/min — ABNORMAL LOW (ref >60)
Glucose, Bld: 126 mg/dL — ABNORMAL HIGH (ref 70–99)
Potassium: 3.7 mmol/L (ref 3.5–5.1)
Sodium: 138 mmol/L (ref 135–145)

## 2024-08-31 LAB — CBC
HCT: 44.6 % (ref 36.0–46.0)
Hemoglobin: 15.3 g/dL — ABNORMAL HIGH (ref 12.0–15.0)
MCH: 29.7 pg (ref 26.0–34.0)
MCHC: 34.3 g/dL (ref 30.0–36.0)
MCV: 86.4 fL (ref 80.0–100.0)
Platelets: 349 K/uL (ref 150–400)
RBC: 5.16 MIL/uL — ABNORMAL HIGH (ref 3.87–5.11)
RDW: 12.1 % (ref 11.5–15.5)
WBC: 11.5 K/uL — ABNORMAL HIGH (ref 4.0–10.5)
nRBC: 0 % (ref 0.0–0.2)

## 2024-08-31 MED ORDER — DIPHENHYDRAMINE HCL 50 MG/ML IJ SOLN
25.0000 mg | Freq: Once | INTRAMUSCULAR | Status: AC
  Administered 2024-09-01: 25 mg via INTRAVENOUS
  Filled 2024-08-31: qty 1

## 2024-08-31 MED ORDER — PROCHLORPERAZINE EDISYLATE 10 MG/2ML IJ SOLN
10.0000 mg | Freq: Once | INTRAMUSCULAR | Status: AC
  Administered 2024-09-01: 10 mg via INTRAVENOUS
  Filled 2024-08-31: qty 2

## 2024-08-31 NOTE — Plan of Care (Signed)

## 2024-08-31 NOTE — Progress Notes (Signed)
 Inpatient Rehab Admissions Coordinator:   Met with patient at bedside.  She's sleepy this morning.  Will start auth today.    Reche Lowers, PT, DPT Admissions Coordinator [phone] 08/31/24 11:08 AM

## 2024-08-31 NOTE — TOC CAGE-AID Note (Signed)
 Transition of Care [facility]) - CAGE-AID Screening   Patient Details  Name: Rhonda Cortez MRN: [id number] Date of Birth: [birth year]  [facility] CM/SW Contact:    Anaia Frith E Demisha Nokes, LCSW Phone Number: 08/31/2024, 2:03 PM   Clinical Narrative: No SA noted.   CAGE-AID Screening:    Have You Ever Felt You Ought to Cut Down on Your Drinking or Drug Use?: No Have People Annoyed You By Critizing Your Drinking Or Drug Use?: No Have You Felt Bad Or Guilty About Your Drinking Or Drug Use?: No Have You Ever Had a Drink or Used Drugs First Thing In The Morning to Steady Your Nerves or to Get Rid of a Hangover?: No CAGE-AID Score: 0  Substance Abuse Education Offered: No

## 2024-08-31 NOTE — Progress Notes (Signed)
 PROGRESS NOTE  Rhonda Cortez FMW:[id number] DOB: 07-10-1961 DOA: 08/27/2024 PCP: Lucius Krabbe, NP   LOS: 4 days   Brief narrative:  Rhonda Cortez is a 63 y.o. female with hx of hypertension and previous stroke presented to hospital with confusion word finding difficulty.  Initial vitals were notable for mild tachycardia.  Patient had mild leukocytosis on presentation.  Hemoglobin A1c was 5.8.  Creatinine at 1.7.  CT head scan done in the ED showed caudate head hemorrhage with intraventricular extension.  Neurology was consulted and patient was admitted to the hospital for further evaluation and treatment.  At this time patient has been seen by physical therapy and recommended CIR.   Assessment/Plan: Principal Problem:   Intraparenchymal hemorrhage of brain (HCC)  ICH:  Left caudate head intraparenchymal hemorrhage with IVH, secondary to hypertension.  CT head -acute intracranial hemorrhage with significant intraventricular extension from an acute bleed in the left caudate nucleus.  Mild asymmetric enlargement of the left lateral ventricle due to blood; temporal horns remain diminutive with trace rightward midline shift.  Neurology following.  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  2D echocardiogram with LV ejection fraction of 60 to 65%.  LDL was 164 with hemoglobin A1c of 5.8.  Urine drug screen negative.  At this time physical therapy has seen the patient and recommended CIR.  Continue Lipitor.   Hx of Stroke/TIA Was on aspirin for few years but currently was off.  Will hold off with antiplatelets for now.   Hypertension  Patient is on Norvasc and valsartan  at home.  Valsartan  is still on hold.  Continue amlodipine.    Hyperlipidemia Patient was on pitavastatin  at home.  Currently on atorvastatin 40 mg.  LDL goal less than 70.  LDL in the hospital at 164.   CKD 3a Creatinine at 1.8 at this time.  Valsartan  held.     Deconditioning debility.  Patient has been seen by physical  therapy and recommend CIR  DVT prophylaxis: SCD's Start: 08/27/24 1645   Disposition: CIR.  Medically stable for disposition.  Status is: Inpatient Remains inpatient appropriate because: Need for CIR    Code Status:     Code Status: Full Code  Family Communication: None at bedside  Consultants: Neurology  Procedures: None  Anti-infectives:  None  Anti-infectives (From admission, onward)    None        Subjective: Today, patient was seen and examined at bedside.  Complains of mild headache.  No nausea vomiting.  Has mild dizziness as well.  Denies shortness of breath cough fever chills or rigor.  Objective: Vitals:   08/31/24 0356 08/31/24 0759  BP: 122/79 132/87  Pulse: 89 87  Resp: 17 18  Temp: 98.6 F (37 C) 99.2 F (37.3 C)  SpO2: 94% 98%    Intake/Output Summary (Last 24 hours) at 08/31/2024 0904 Last data filed at 08/31/2024 0000 Gross per 24 hour  Intake 760 ml  Output --  Net 760 ml   Filed Weights   08/27/24 1205 08/27/24 1950  Weight: 54.4 kg 63.6 kg   Body mass index is 24.84 kg/m.   Physical Exam: GENERAL: Patient is alert awake and oriented. Not in obvious distress. HENT: No scleral pallor or icterus. Pupils equally reactive to light. Oral mucosa is moist NECK: is supple, no gross swelling noted. CHEST: Clear to auscultation. No crackles or wheezes.  Diminished breath sounds bilaterally. CVS: S1 and S2 heard, no murmur. Regular rate and rhythm.  ABDOMEN: Soft,  non-tender, bowel sounds are present. EXTREMITIES: No edema. CNS: Cranial nerves are intact.  Moves all extremities. SKIN: warm and dry without rashes.  Data Review: I have personally reviewed the following laboratory data and studies,  CBC: Recent Labs  Lab 08/27/24 1222 08/27/24 1226 08/28/24 0851 08/29/24 0535 08/30/24 0144 08/31/24 0148  WBC 10.4  --  12.1* 12.2* 12.2* 11.5*  NEUTROABS 8.2*  --   --   --   --   --   HGB 15.4* 15.6* 15.1* 14.8 14.3 15.3*  HCT  45.0 46.0 43.4 44.6 43.2 44.6  MCV 86.9  --  86.6 89.6 88.2 86.4  PLT 369  --  344 342 326 349   Basic Metabolic Panel: Recent Labs  Lab 08/27/24 1222 08/27/24 1226 08/28/24 0851 08/29/24 0535 08/30/24 0144 08/31/24 0148  NA 144 144 141 144 138 138  K 3.9 4.2 4.2 4.4 3.7 3.7  CL 107 110 107 106 103 103  CO2 21*  --  22 25 22 22   GLUCOSE 131* 138* 133* 116* 183* 126*  BUN 29* 33* 25* 28* 26* 25*  CREATININE 1.97* 1.90* 1.74* 2.19* 1.95* 1.81*  CALCIUM  10.0  --  9.5 9.4 8.8* 9.0   Liver Function Tests: Recent Labs  Lab 08/27/24 1222  AST 24  ALT 17  ALKPHOS 69  BILITOT 0.8  PROT 8.2*  ALBUMIN 4.5   No results for input(s): LIPASE, AMYLASE in the last 168 hours. No results for input(s): AMMONIA in the last 168 hours. Cardiac Enzymes: No results for input(s): CKTOTAL, CKMB, CKMBINDEX, TROPONINI in the last 168 hours. BNP (last 3 results) No results for input(s): BNP in the last 8760 hours.  ProBNP (last 3 results) No results for input(s): PROBNP in the last 8760 hours.  CBG: Recent Labs  Lab 08/27/24 1240  GLUCAP 135*   Recent Results (from the past 240 hours)  MRSA Next Gen by PCR, Nasal     Status: None   Collection Time: 08/27/24  8:48 PM   Specimen: Nasal Mucosa; Nasal Swab  Result Value Ref Range Status   MRSA by PCR Next Gen NOT DETECTED NOT DETECTED Final    Comment: (NOTE) The GeneXpert MRSA Assay (FDA approved for NASAL specimens only), is one component of a comprehensive MRSA colonization surveillance program. It is not intended to diagnose MRSA infection nor to guide or monitor treatment for MRSA infections. Test performance is not FDA approved in patients less than 54 years old. Performed at [facility], [address] [place], [place] [address]      Studies: VAS US  CAROTID Result Date: 08/30/2024 Carotid Arterial Duplex Study Patient Name:  Rhonda Cortez  Date of Exam:   08/30/2024 Medical Rec #: [id number]     Accession #:    [id number] Date of Birth: Jun 16, 1961    Patient Gender: F Patient Age:   63 years Exam Location:  [facility] Procedure:      VAS US  CAROTID Referring Phys: ARY XU --------------------------------------------------------------------------------  Indications:       CVA. Risk Factors:      Hypertension, hyperlipidemia. Comparison Study:  No prior studies. Performing Technologist: Cordella Collet RVT  Examination Guidelines: A complete evaluation includes B-mode imaging, spectral Doppler, color Doppler, and power Doppler as needed of all accessible portions of each vessel. Bilateral testing is considered an integral part of a complete examination. Limited examinations for reoccurring indications may be performed as noted.  Right Carotid Findings: +----------+--------+--------+--------+-----------------------+--------+  PSV cm/sEDV cm/sStenosisPlaque Description     Comments +----------+--------+--------+--------+-----------------------+--------+ CCA Prox  73      12              smooth and heterogenous         +----------+--------+--------+--------+-----------------------+--------+ CCA Distal66      20              smooth and heterogenous         +----------+--------+--------+--------+-----------------------+--------+ ICA Prox  43      13                                              +----------+--------+--------+--------+-----------------------+--------+ ICA Mid   40      16                                              +----------+--------+--------+--------+-----------------------+--------+ ICA Distal55      21                                     tortuous +----------+--------+--------+--------+-----------------------+--------+ ECA       79      12                                              +----------+--------+--------+--------+-----------------------+--------+ +----------+--------+-------+--------+-------------------+            PSV cm/sEDV cmsDescribeArm Pressure (mmHG) +----------+--------+-------+--------+-------------------+ [id number]                                         +----------+--------+-------+--------+-------------------+ +---------+--------+--+--------+-+---------+ VertebralPSV cm/s24EDV cm/s6Antegrade +---------+--------+--+--------+-+---------+  Left Carotid Findings: +----------+--------+--------+--------+-----------------------+--------+           PSV cm/sEDV cm/sStenosisPlaque Description     Comments +----------+--------+--------+--------+-----------------------+--------+ CCA Prox  86      21              smooth and heterogenous         +----------+--------+--------+--------+-----------------------+--------+ CCA Distal58      17              smooth and heterogenous         +----------+--------+--------+--------+-----------------------+--------+ ICA Prox  28      13                                              +----------+--------+--------+--------+-----------------------+--------+ ICA Mid   55      26                                              +----------+--------+--------+--------+-----------------------+--------+ ICA Distal38      19  tortuous +----------+--------+--------+--------+-----------------------+--------+ ECA       67      12                                              +----------+--------+--------+--------+-----------------------+--------+ +----------+--------+--------+--------+-------------------+           PSV cm/sEDV cm/sDescribeArm Pressure (mmHG) +----------+--------+--------+--------+-------------------+ Subclavian118                                         +----------+--------+--------+--------+-------------------+ +---------+--------+--+--------+-+---------+ VertebralPSV cm/s24EDV cm/s7Antegrade +---------+--------+--+--------+-+---------+   Summary: Right Carotid:  Velocities in the right ICA are consistent with a 1-39% stenosis. Left Carotid: Velocities in the left ICA are consistent with a 1-39% stenosis. Vertebrals: Bilateral vertebral arteries demonstrate antegrade flow. *See table(s) above for measurements and observations.  Electronically signed by Eather Popp MD on 08/30/2024 at 1:27:18 PM.    Final    MR ANGIO HEAD WO CONTRAST Result Date: 08/29/2024 EXAM: MR Angiography Head without intravenous Contrast. 08/29/2024 11:54:00 AM TECHNIQUE: Magnetic resonance angiography images of the head without intravenous contrast. Multiplanar 2D and 3D reformatted images are provided for review. COMPARISON: None provided. CLINICAL HISTORY: Stroke, hemorrhagic. FINDINGS: ANTERIOR CIRCULATION: There is mild stenosis of the ophthalmic segments of the internal carotid arteries bilaterally. No significant stenosis of the anterior cerebral arteries. 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 There is also severe stenosis of the proximal M2 branches of the left middle cerebral artery. No aneurysm. POSTERIOR CIRCULATION: No significant stenosis of the posterior cerebral arteries. There is mild-to-moderate focal stenosis of the mid basilar artery, approximately 30 to 40%. No significant stenosis of the vertebral arteries. No aneurysm. IMPRESSION: 1. 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 2. Mild stenosis of the ophthalmic segments of the internal carotid arteries bilaterally. 3. Mild-to-moderate focal stenosis of the mid basilar artery, approximately 30 to 40%. Electronically signed by: Evalene Coho MD 08/29/2024 12:11 PM EST RP Workstation: [id number]   MR BRAIN WO CONTRAST Result Date: 08/29/2024 EXAM: MRI BRAIN WITHOUT CONTRAST 08/29/2024 11:54:00 AM TECHNIQUE: Multiplanar multisequence MRI of the head/brain was performed without the administration of  intravenous contrast. COMPARISON: CT of the head dated 08/28/2024. CLINICAL HISTORY: Stroke, hemorrhagic. FINDINGS: BRAIN AND VENTRICLES: No acute infarct. Focal area of intraparenchymal hemorrhage again demonstrated within the left caudate measuring approximately 1.7 x 1.1 x 1.9 cm. Intraventricular hemorrhage again demonstrated primarily within the left lateral ventricle, but also layering independently within the posterior horn of the right lateral ventricle and within the fourth ventricle. No evidence of underlying mass. No midline shift. No hydrocephalus. Extensive cerebral white matter disease. Small focus of hemosiderin staining present within each frontal lobe and within the left anteromedial cerebellar hemisphere. The sella is unremarkable. Normal flow voids. ORBITS: No acute abnormality. SINUSES AND MASTOIDS: No acute abnormality. BONES AND SOFT TISSUES: Normal marrow signal. No acute soft tissue abnormality. IMPRESSION: 1. Focal area of intraparenchymal hemorrhage within the left caudate measuring approximately 1.7 x 1.1 x 1.9 cm, as seen on prior CT. 2. Intraventricular hemorrhage, primarily within the left lateral ventricle, but also layering independently within the posterior horn of the right lateral ventricle and within the fourth ventricle, as seen  on prior CT. 3. No evidence of underlying mass. 4. Extensive cerebral white matter disease. 5. Small focus of hemosiderin staining present within each frontal lobe and within the left anteromedial cerebellar hemisphere. Electronically signed by: Evalene Coho MD 08/29/2024 12:08 PM EST RP Workstation: [id number]      Vernal Alstrom, MD  [facility] 08/31/2024  If 7PM-7AM, please contact night-coverage

## 2024-08-31 NOTE — Hospital Course (Addendum)
 Rhonda Cortez is a 63 y.o. female with hx of hypertension and previous stroke presented to hospital with confusion word finding difficulty.  CT head scan done in the ED showed caudate head hemorrhage with intraventricular extension.  Neurology was consulted and patient was admitted to the hospital for further evaluation and treatment.    ICH:  Left caudate head intraparenchymal hemorrhage with IVH, secondary to hypertension.  CT head -acute intracranial hemorrhage with significant intraventricular extension from an acute bleed in the left caudate nucleus.  Mild asymmetric enlargement of the left lateral ventricle due to blood; temporal horns remain diminutive with trace rightward midline shift.  Neurology following.  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  2D echocardiogram with LV ejection fraction of 60 to 65%.  LDL was 164 with hemoglobin A1c of 5.8.  Urine drug screen negative.   Hx of Stroke/TIA Was on aspirin for few years but currently was off   Hypertension  Patient is on Norvasc and valsartan  at home.  Valsartan  is still on hold.    Hyperlipidemia Patient was on pitavastatin  at home.  Currently on atorvastatin 40 mg.  LDL goal less than 70.  LDL in the hospital at 164.   CKD 3a Creatinine at 1.8 at this time.  Valsartan  held.     Deconditioning debility.  Patient has been seen by physical therapy and recommend CIR

## 2024-08-31 NOTE — Plan of Care (Signed)
  Problem: Intracerebral Hemorrhage Tissue Perfusion: Goal: Complications of Intracerebral Hemorrhage will be minimized Outcome: Progressing   Problem: Coping: Goal: Will verbalize positive feelings about self Outcome: Progressing   Problem: Health Behavior/Discharge Planning: Goal: Ability to manage health-related needs will improve Outcome: Progressing   Problem: Self-Care: Goal: Ability to participate in self-care as condition permits will improve Outcome: Progressing   Problem: Health Behavior/Discharge Planning: Goal: Ability to manage health-related needs will improve Outcome: Progressing   Problem: Activity: Goal: Risk for activity intolerance will decrease Outcome: Progressing   Problem: Pain Managment: Goal: General experience of comfort will improve and/or be controlled Outcome: Progressing   Problem: Safety: Goal: Ability to remain free from injury will improve Outcome: Progressing   Problem: Skin Integrity: Goal: Risk for impaired skin integrity will decrease Outcome: Progressing

## 2024-08-31 NOTE — Progress Notes (Signed)
 Physical Therapy Treatment Patient Details Name: Rhonda Cortez MRN: [id number] DOB: [birth year] Today's Date: 08/31/2024   History of Present Illness Pt is a 63 y.o. female presenting 11/14 with difficulty word finding and confusion. CTH 11/14 with acute intracranial hemorrhage in the left caudate nucleus with intraventricular extension; trace rightward midline shift. CTH 11/15 with bleed stable. PMH: HTN, CVA    PT Comments  Pt demonstrating gains with gait training and functional mobility today. Bed mobility performed at supervision level, with pt using handrails to assist coming into sitting. Pt demonstrating good coordination and depth perception with toileting, washing hands, and washing face today. Assessed pt with RW, and did not note staggering, drifting, or overt LOB. Without RW, pt remained steady though slow with gait, and only required CGA for safety. Treatment limited by bout of emesis in hallway. She denies dizziness or lightheadedness during session, stating she became nauseous upon standing. RN gave pt nausea medicine following this.Continuing to recommend post-acute rehab >3 hrs/day to improve functional mobility and regain independence. Acute PT to follow.     If plan is discharge home, recommend the following: A little help with walking and/or transfers;A little help with bathing/dressing/bathroom;Assistance with cooking/housework;Direct supervision/assist for medications management;Direct supervision/assist for financial management;Assist for transportation;Supervision due to cognitive status;Help with stairs or ramp for entrance   Can travel by private vehicle        Equipment Recommendations  Rolling walker (2 wheels);BSC/3in1    Recommendations for Other Services Rehab consult     Precautions / Restrictions Precautions Precautions: Fall;Other (comment) Recall of Precautions/Restrictions: Impaired Precaution/Restrictions Comments: SBP < 160 Restrictions Weight Bearing  Restrictions Per Provider Order: No     Mobility  Bed Mobility Overal bed mobility: Needs Assistance Bed Mobility: Sit to Supine       Sit to supine: Supervision   General bed mobility comments: Pt using handrails to assume sitting position    Transfers Overall transfer level: Needs assistance Equipment used: Rolling walker (2 wheels) Transfers: Sit to/from Stand Sit to Stand: Contact guard assist           General transfer comment: Demonstrates good ability to power up and maintain standing    Ambulation/Gait Ambulation/Gait assistance: Contact guard assist Gait Distance (Feet): 35 Feet (1 standing rest break in this distance) Assistive device: Rolling walker (2 wheels), None Gait Pattern/deviations: Step-through pattern, Decreased stride length Gait velocity: reduced Gait velocity interpretation: 1.31 - 2.62 ft/sec, indicative of limited community ambulator   General Gait Details: Pt ambulates well through narrow bathroom doorway with RW and mainains proper position and proximity. Attempted without AD as well, and pt maintain good static and dynamic balance and similar gait pattern   Stairs             Wheelchair Mobility     Tilt Bed    Modified Rankin (Stroke Patients Only) Modified Rankin (Stroke Patients Only) Pre-Morbid Rankin Score: No symptoms Modified Rankin: Moderately severe disability     Balance Overall balance assessment: Needs assistance Sitting-balance support: No upper extremity supported, Feet supported Sitting balance-Leahy Scale: Fair     Standing balance support: Bilateral upper extremity supported, No upper extremity supported, During functional activity Standing balance-Leahy Scale: Fair                              Communication Communication Communication: Impaired Factors Affecting Communication: Reduced clarity of speech;Difficulty expressing self  Cognition Arousal: Alert Behavior During Therapy: [facility]  for tasks assessed/performed   PT - Cognitive impairments: Problem solving, Attention                       PT - Cognition Comments: Demonstrated some delays with problem solving, but appropriately responds to challenges in environment during functional mobility. Following commands: Impaired Following commands impaired: Follows multi-step commands with increased time    Cueing Cueing Techniques: Verbal cues  Exercises      General Comments General comments (skin integrity, edema, etc.): Pt with bout of emesis in hallway during gait today, stating after that she became nauseous upon standing. She denied dizziness or lightheadedness at that time. RN gave pt nausea medicine following this.      Pertinent Vitals/Pain Pain Assessment Pain Assessment: Faces Faces Pain Scale: Hurts even more Pain Location: Head Pain Descriptors / Indicators: Grimacing Pain Intervention(s): Limited activity within patient's tolerance, Monitored during session, RN gave pain meds during session    Home Living                          Prior Function            PT Goals (current goals can now be found in the care plan section) Acute Rehab PT Goals PT Goal Formulation: With patient/family Time For Goal Achievement: 09/12/24 Potential to Achieve Goals: Good Progress towards PT goals: Progressing toward goals    Frequency    Min 3X/week      PT Plan      Co-evaluation              AM-PAC PT 6 Clicks Mobility   Outcome Measure  Help needed turning from your back to your side while in a flat bed without using bedrails?: A Little Help needed moving from lying on your back to sitting on the side of a flat bed without using bedrails?: A Little Help needed moving to and from a bed to a chair (including a wheelchair)?: A Little Help needed standing up from a chair using your arms (e.g., wheelchair or bedside chair)?: A Little Help needed to walk in hospital room?: A  Little Help needed climbing 3-5 steps with a railing? : A Lot 6 Click Score: 17    End of Session Equipment Utilized During Treatment: Gait belt Activity Tolerance: Patient tolerated treatment well;Other (comment) (Bout of emesis) Patient left: in chair;with call bell/phone within reach;with chair alarm set Nurse Communication: Mobility status PT Visit Diagnosis: Unsteadiness on feet (R26.81);Other abnormalities of gait and mobility (R26.89);Difficulty in walking, not elsewhere classified (R26.2);Other symptoms and signs involving the nervous system (R29.898)     Time: 8784-8760 PT Time Calculation (min) (ACUTE ONLY): 24 min  Charges:    $Therapeutic Activity: 23-37 mins PT General Charges $$ ACUTE PT VISIT: 1 Visit                     Chayil Gantt, SPT    Zamire Whitehurst 08/31/2024, 1:57 PM

## 2024-08-31 NOTE — Progress Notes (Addendum)
 STROKE TEAM PROGRESS NOTE    SIGNIFICANT HOSPITAL EVENTS 11/14- Admitted with small ICH  11/16-transferred out of the ICU  INTERIM HISTORY/SUBJECTIVE Patient remains hemodynamically stable and afebrile.  She is awaiting insurance authorization to transfer to CIR.  OBJECTIVE  CBC    Component Value Date/Time   WBC 11.5 (H) 08/31/2024 0148   RBC 5.16 (H) 08/31/2024 0148   HGB 15.3 (H) 08/31/2024 0148   HCT 44.6 08/31/2024 0148   PLT 349 08/31/2024 0148   MCV 86.4 08/31/2024 0148   MCH 29.7 08/31/2024 0148   MCHC 34.3 08/31/2024 0148   RDW 12.1 08/31/2024 0148   LYMPHSABS 1.6 08/27/2024 1222   MONOABS 0.5 08/27/2024 1222   EOSABS 0.0 08/27/2024 1222   BASOSABS 0.0 08/27/2024 1222    BMET    Component Value Date/Time   NA 138 08/31/2024 0148   K 3.7 08/31/2024 0148   CL 103 08/31/2024 0148   CO2 22 08/31/2024 0148   GLUCOSE 126 (H) 08/31/2024 0148   BUN 25 (H) 08/31/2024 0148   CREATININE 1.81 (H) 08/31/2024 0148   CALCIUM  9.0 08/31/2024 0148   EGFR 33.0 05/12/2024 0000   GFRNONAA 31 (L) 08/31/2024 0148    IMAGING past 24 hours No results found.   Vitals:   08/31/24 0032 08/31/24 0356 08/31/24 0759 08/31/24 1146  BP: (!) 141/88 122/79 132/87 132/85  Pulse: 88 89 87 98  Resp: 17 17 18    Temp: 99.2 F (37.3 C) 98.6 F (37 C) 99.2 F (37.3 C) 99.4 F (37.4 C)  TempSrc: Oral Oral Oral Oral  SpO2: 97% 94% 98% 96%  Weight:      Height:         PHYSICAL EXAM General: Drowsy, well-nourished, well-developed patient in no acute distress Psych:  Mood and affect appropriate for situation CV: Regular rate and rhythm on monitor Respiratory:  Regular, unlabored respirations on room air GI: Abdomen soft and nontender   NEURO:  Mental Status: Drowsy but oriented to self, place, situation.  Responses somewhat slow Speech/Language: speech is without dysarthria  Mild difficulty with word finding  Cranial Nerves:  II: PERRL.  III, IV, VI: EOMI. Eyelids elevate  symmetrically.  VII: Face is symmetrical resting and smiling VIII: hearing intact to voice. IX, X:Phonation is normal.  XII: tongue is midline without fasciculations. Motor: 5/5 strength to all muscle groups tested.  Tone: is normal and bulk is normal Sensation- Intact to light touch bilaterally. Coordination: FTN intact bilaterally Gait- deferred  Most Recent NIH 2     ASSESSMENT/PLAN  [name] is a 63 y.o. female with history of CKD and hypertension who presents with confusion and found to have a small head of the caudate hemorrhage.  She is currently awaiting insurance authorization and bed availability to transfer to CIR.  NIH on Admission 1  ICH:  Left caudate head CH with IVH, likely etiology:  hypertensive, but need to rule out AVM CT head -acute intracranial hemorrhage with significant intraventricular extension from an acute bleed in the left caudate nucleus.  Mild asymmetric enlargement of the left lateral ventricle due to blood; temporal horns remain diminutive with trace rightward midline shift Repeat CT head - Caudate head hemorrhage with intraventricular extension  MRI - Focal area of intraparenchymal hemorrhage within the left caudate measuring approximately 1.7 x 1.1 x 1.9 cm, as seen on prior CT.  Intraventricular hemorrhage, primarily within the left lateral ventricle, but also layering independently within the posterior horn of the right  lateral ventricle and within the fourth ventricle, as seen on prior CT. No evidence of underlying mass. Extensive cerebral white matter disease. Small focus of hemosiderin staining present within each frontal lobe and within the left anteromedial cerebellar hemisphere. 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 Mild stenosis of the ophthalmic segments of the internal carotid arteries bilaterally. Mild-to-moderate focal stenosis of the mid  basilar artery, approximately 30 to 40%.  2D Echo EF 60 to 65% Carotid US  -not done as patient uncooperative  LDL 164 HgbA1c 5.8 UDS negative VTE prophylaxis - SCDs  No antithrombotic prior to admission, now on No antithrombotic due to ICH Therapy recommendations:  CIR Disposition:  Pending   Hx of Stroke/TIA Stroke in 2014 - arm and leg weakness, difficulty walking Was on ASA after stroke for a number of years and then off No significant residual deficit  Hypertension  Home meds:  Norvasc, valsartan  Resume norvasc  Stable BP goal less than 160 Long-term BP goal normotensive  Hyperlipidemia Home meds:  pitavastatin  LDL 164, goal < 70 Start atorvastatin 40mg   CKD 3a Cr 1.97 - 1.9 - 2.19-1.95-1.81 GFR 31 Valsartan  on hold   Other Active Problems   Hospital day # 4  Patient seen and examined by NP/APP with MD. MD to update note as needed.   Rhonda Cortez Rhonda Cortez , MSN, AGACNP-BC [facility] See Amion for schedule and pager information 08/31/2024 12:17 PM   I have personally obtained history,examined this patient, reviewed notes, independently viewed imaging studies, participated in medical decision making and plan of care.ROS completed by me personally and pertinent positives fully documented  I have made any additions or clarifications directly to the above note. Agree with note above.  Patient is neurologically stable.  Transfer to inpatient rehab when bed available.  No family at the bedside.    Rhonda Popp, MD Medical Director [facility] Pager: [phone] 08/31/2024 4:23 PM  To contact Stroke Continuity provider, please refer to [email or web address]. After hours, contact General Neurology

## 2024-09-01 ENCOUNTER — Inpatient Hospital Stay (HOSPITAL_COMMUNITY)

## 2024-09-01 DIAGNOSIS — I619 Nontraumatic intracerebral hemorrhage, unspecified: Secondary | ICD-10-CM | POA: Diagnosis not present

## 2024-09-01 LAB — CBC
HCT: 44.2 % (ref 36.0–46.0)
Hemoglobin: 15.2 g/dL — ABNORMAL HIGH (ref 12.0–15.0)
MCH: 29.6 pg (ref 26.0–34.0)
MCHC: 34.4 g/dL (ref 30.0–36.0)
MCV: 86.2 fL (ref 80.0–100.0)
Platelets: 311 K/uL (ref 150–400)
RBC: 5.13 MIL/uL — ABNORMAL HIGH (ref 3.87–5.11)
RDW: 11.9 % (ref 11.5–15.5)
WBC: 11.9 K/uL — ABNORMAL HIGH (ref 4.0–10.5)
nRBC: 0 % (ref 0.0–0.2)

## 2024-09-01 LAB — BASIC METABOLIC PANEL WITH GFR
Anion gap: 16 — ABNORMAL HIGH (ref 5–15)
BUN: 24 mg/dL — ABNORMAL HIGH (ref 8–23)
CO2: 23 mmol/L (ref 22–32)
Calcium: 9.4 mg/dL (ref 8.9–10.3)
Chloride: 99 mmol/L (ref 98–111)
Creatinine, Ser: 2.2 mg/dL — ABNORMAL HIGH (ref 0.44–1.00)
GFR, Estimated: 25 mL/min — ABNORMAL LOW (ref >60)
Glucose, Bld: 126 mg/dL — ABNORMAL HIGH (ref 70–99)
Potassium: 3.9 mmol/L (ref 3.5–5.1)
Sodium: 138 mmol/L (ref 135–145)

## 2024-09-01 MED ORDER — ACETAMINOPHEN 325 MG PO TABS
650.0000 mg | ORAL_TABLET | ORAL | Status: DC | PRN
  Administered 2024-09-01 – 2024-09-02 (×2): 650 mg via ORAL
  Filled 2024-09-01 (×2): qty 2

## 2024-09-01 MED ORDER — OXYCODONE HCL 5 MG PO TABS
5.0000 mg | ORAL_TABLET | Freq: Four times a day (QID) | ORAL | Status: DC | PRN
  Administered 2024-09-01 – 2024-09-02 (×2): 5 mg via ORAL
  Filled 2024-09-01 (×2): qty 1

## 2024-09-01 NOTE — Progress Notes (Signed)
 PT Cancellation Note  Patient Details Name: Rhonda Cortez MRN: [id number] DOB: January 15, 1961   Cancelled Treatment:    Reason Eval/Treat Not Completed: (P) Patient declined, no reason specified (Pt reports 7/10 headache and declines session at this time. Will follow up as able.)   Darryle George 09/01/2024, 10:55 AM

## 2024-09-01 NOTE — Progress Notes (Addendum)
 PROGRESS NOTE  DOROTA HEINRICHS FMW:[id number] DOB: January 20, 1961 DOA: 08/27/2024 PCP: Lucius Krabbe, NP   LOS: 5 days   Brief narrative:  Rhonda Cortez is a 63 y.o. female with hx of hypertension and previous stroke presented to hospital with confusion word finding difficulty.  Initial vitals were notable for mild tachycardia.  Patient had mild leukocytosis on presentation.  Hemoglobin A1c was 5.8.  Creatinine at 1.7.  CT head scan done in the ED showed caudate head hemorrhage with intraventricular extension.  Neurology was consulted and patient was admitted to the hospital for further evaluation and treatment.  At this time patient has been seen by physical therapy and recommended CIR.   Assessment/Plan: Principal Problem:   Intraparenchymal hemorrhage of brain (HCC)  ICH:  Left caudate head intraparenchymal hemorrhage with IVH, secondary to hypertension.  CT head -acute intracranial hemorrhage with significant intraventricular extension from an acute bleed in the left caudate nucleus.  Mild asymmetric enlargement of the left lateral ventricle due to blood; temporal horns remain diminutive with trace rightward midline shift.  Neurology following.  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  2D echocardiogram with LV ejection fraction of 60 to 65%.  LDL was 164 with hemoglobin A1c of 5.8.  Urine drug screen negative.  At this time physical therapy has seen the patient and recommended CIR.  Continue Lipitor.  Severe headache.  Could be secondary to intracranial hemorrhage but patient complains of 7/ 10 frontal headache.  I have reached out to neurology for further opinion.  Question need for repeat CT scan to rule out developing hydrocephalus due to history of intraventricular hemorrhage.  Patient however denies any nausea vomiting or visual issues.  Denies history of migraine in the past.  Denies excessive caffeine ingestion or use of nicotine .  New fever.  Temperature max of 100.6 F yesterday.   Had few more episodes of fever this morning and required Tylenol .  Will check a urinalysis, chest x-ray and send blood cultures.   Hx of Stroke/TIA Was on aspirin  for few years but currently was off.  Will hold off with antiplatelets for now.   Hypertension  Patient is on Norvasc  and valsartan  at home.  Valsartan  is still on hold.  Continue amlodipine .   Hyperlipidemia Patient was on pitavastatin  at home.  Currently on atorvastatin  40 mg.  LDL goal less than 70.  LDL in the hospital at 164.   CKD 3a Creatinine at 2.2 from 1.8 at this time.  Valsartan  held.  Will do gentle IV fluids today.  Deconditioning debility.  Patient has been seen by physical therapy and recommend CIR  DVT prophylaxis: SCD's Start: 08/27/24 1645   Disposition: CIR likely in 1 to 2 days  Status is: Inpatient Remains inpatient appropriate because: Need for CIR, new fever, ongoing headache    Code Status:     Code Status: Full Code  Family Communication: Spoke with the patient's fianc Mr. Kimberlee at bedside yesterday and today.  Consultants: Neurology  Procedures: None  Anti-infectives:  None  Anti-infectives (From admission, onward)    None      Subjective: Today, patient was seen and examined at bedside.  Patient still complains of 7/10 headache but no nausea vomiting or blurry vision.  Fever overnight.  Denies any shortness of breath cough congestion nausea vomiting abdominal pain or diarrhea.  Denies urinary symptoms.  Objective: Vitals:   09/01/24 0445 09/01/24 0754  BP: (!) 142/87 137/89  Pulse: 93 89  Resp: 17  18  Temp: 99.7 F (37.6 C) 98.6 F (37 C)  SpO2: 95% 97%    Intake/Output Summary (Last 24 hours) at 09/01/2024 1121 Last data filed at 08/31/2024 2200 Gross per 24 hour  Intake 120 ml  Output --  Net 120 ml   Filed Weights   08/27/24 1205 08/27/24 1950  Weight: 54.4 kg 63.6 kg   Body mass index is 24.84 kg/m.   Physical Exam: GENERAL: Patient is alert awake  and oriented. Not in obvious distress.  Closing her eyes, HENT: No scleral pallor or icterus. Pupils equally reactive to light. Oral mucosa is moist NECK: is supple, no gross swelling noted. CHEST: Clear to auscultation. No crackles or wheezes.  CVS: S1 and S2 heard, no murmur. Regular rate and rhythm.  ABDOMEN: Soft, non-tender, bowel sounds are present. EXTREMITIES: No edema. CNS: Cranial nerves are intact.  Moves all extremities. SKIN: warm and dry without rashes.  Data Review: I have personally reviewed the following laboratory data and studies,  CBC: Recent Labs  Lab 08/27/24 1222 08/27/24 1226 08/28/24 0851 08/29/24 0535 08/30/24 0144 08/31/24 0148 09/01/24 0440  WBC 10.4  --  12.1* 12.2* 12.2* 11.5* 11.9*  NEUTROABS 8.2*  --   --   --   --   --   --   HGB 15.4*   < > 15.1* 14.8 14.3 15.3* 15.2*  HCT 45.0   < > 43.4 44.6 43.2 44.6 44.2  MCV 86.9  --  86.6 89.6 88.2 86.4 86.2  PLT 369  --  344 342 326 349 311   < > = values in this interval not displayed.   Basic Metabolic Panel: Recent Labs  Lab 08/28/24 0851 08/29/24 0535 08/30/24 0144 08/31/24 0148 09/01/24 0440  NA 141 144 138 138 138  K 4.2 4.4 3.7 3.7 3.9  CL 107 106 103 103 99  CO2 22 25 22 22 23   GLUCOSE 133* 116* 183* 126* 126*  BUN 25* 28* 26* 25* 24*  CREATININE 1.74* 2.19* 1.95* 1.81* 2.20*  CALCIUM  9.5 9.4 8.8* 9.0 9.4   Liver Function Tests: Recent Labs  Lab 08/27/24 1222  AST 24  ALT 17  ALKPHOS 69  BILITOT 0.8  PROT 8.2*  ALBUMIN 4.5   No results for input(s): LIPASE, AMYLASE in the last 168 hours. No results for input(s): AMMONIA in the last 168 hours. Cardiac Enzymes: No results for input(s): CKTOTAL, CKMB, CKMBINDEX, TROPONINI in the last 168 hours. BNP (last 3 results) No results for input(s): BNP in the last 8760 hours.  ProBNP (last 3 results) No results for input(s): PROBNP in the last 8760 hours.  CBG: Recent Labs  Lab 08/27/24 1240  GLUCAP 135*    Recent Results (from the past 240 hours)  MRSA Next Gen by PCR, Nasal     Status: None   Collection Time: 08/27/24  8:48 PM   Specimen: Nasal Mucosa; Nasal Swab  Result Value Ref Range Status   MRSA by PCR Next Gen NOT DETECTED NOT DETECTED Final    Comment: (NOTE) The GeneXpert MRSA Assay (FDA approved for NASAL specimens only), is one component of a comprehensive MRSA colonization surveillance program. It is not intended to diagnose MRSA infection nor to guide or monitor treatment for MRSA infections. Test performance is not FDA approved in patients less than 14 years old. Performed at [facility], [address] [place], [place] [address]      Studies: VAS US  CAROTID Result Date: 08/30/2024 Carotid  Arterial Duplex Study Patient Name:  KASMIRA CACIOPPO  Date of Exam:   08/30/2024 Medical Rec #: [id number]    Accession #:    [id number] Date of Birth: 04-03-1961    Patient Gender: F Patient Age:   5 years Exam Location:  [facility] Procedure:      VAS US  CAROTID Referring Phys: ARY XU --------------------------------------------------------------------------------  Indications:       CVA. Risk Factors:      Hypertension, hyperlipidemia. Comparison Study:  No prior studies. Performing Technologist: Cordella Collet RVT  Examination Guidelines: A complete evaluation includes B-mode imaging, spectral Doppler, color Doppler, and power Doppler as needed of all accessible portions of each vessel. Bilateral testing is considered an integral part of a complete examination. Limited examinations for reoccurring indications may be performed as noted.  Right Carotid Findings: +----------+--------+--------+--------+-----------------------+--------+           PSV cm/sEDV cm/sStenosisPlaque Description     Comments +----------+--------+--------+--------+-----------------------+--------+ CCA Prox  73      12              smooth and heterogenous          +----------+--------+--------+--------+-----------------------+--------+ CCA Distal66      20              smooth and heterogenous         +----------+--------+--------+--------+-----------------------+--------+ ICA Prox  43      13                                              +----------+--------+--------+--------+-----------------------+--------+ ICA Mid   40      16                                              +----------+--------+--------+--------+-----------------------+--------+ ICA Distal55      21                                     tortuous +----------+--------+--------+--------+-----------------------+--------+ ECA       79      12                                              +----------+--------+--------+--------+-----------------------+--------+ +----------+--------+-------+--------+-------------------+           PSV cm/sEDV cmsDescribeArm Pressure (mmHG) +----------+--------+-------+--------+-------------------+ [id number]                                         +----------+--------+-------+--------+-------------------+ +---------+--------+--+--------+-+---------+ VertebralPSV cm/s24EDV cm/s6Antegrade +---------+--------+--+--------+-+---------+  Left Carotid Findings: +----------+--------+--------+--------+-----------------------+--------+           PSV cm/sEDV cm/sStenosisPlaque Description     Comments +----------+--------+--------+--------+-----------------------+--------+ CCA Prox  86      21              smooth and heterogenous         +----------+--------+--------+--------+-----------------------+--------+ CCA Distal58      17              smooth and  heterogenous         +----------+--------+--------+--------+-----------------------+--------+ ICA Prox  28      13                                              +----------+--------+--------+--------+-----------------------+--------+ ICA Mid   55      26                                               +----------+--------+--------+--------+-----------------------+--------+ ICA Distal38      19                                     tortuous +----------+--------+--------+--------+-----------------------+--------+ ECA       67      12                                              +----------+--------+--------+--------+-----------------------+--------+ +----------+--------+--------+--------+-------------------+           PSV cm/sEDV cm/sDescribeArm Pressure (mmHG) +----------+--------+--------+--------+-------------------+ [id number]                                         +----------+--------+--------+--------+-------------------+ +---------+--------+--+--------+-+---------+ VertebralPSV cm/s24EDV cm/s7Antegrade +---------+--------+--+--------+-+---------+   Summary: Right Carotid: Velocities in the right ICA are consistent with a 1-39% stenosis. Left Carotid: Velocities in the left ICA are consistent with a 1-39% stenosis. Vertebrals: Bilateral vertebral arteries demonstrate antegrade flow. *See table(s) above for measurements and observations.  Electronically signed by Eather Popp MD on 08/30/2024 at 1:27:18 PM.    Final       Vernal Alstrom, MD  [facility] 09/01/2024  If 7PM-7AM, please contact night-coverage

## 2024-09-01 NOTE — Progress Notes (Signed)
 Speech Language Pathology Treatment: Cognitive-Linguistic  Patient Details Name: Rhonda Cortez MRN: [id number] DOB: 1961-01-11 Today's Date: 09/01/2024 Time: 8344-8292 SLP Time Calculation (min) (ACUTE ONLY): 12 min  Assessment / Plan / Recommendation Clinical Impression  Patient was less alert and more drowsy compared to last session on 11/15. When SLP asked about noted headache from PT note, patient responded that it was still hurting on a scale of 7 out of 10. She shared she was given pain medication for her headache. SLP monitored pain during session. Patient was oriented to self, place, and situation. Patient was not oriented to time, sharing it was Thursday and when asked what month it was she responded Thursday. SLP probed patient by telling her Thanksgiving is next week, asking what month Thanksgiving is in and patient responded Thursday. Patient continued to perseverate on previous answers throughout the session. Patient shared her correct home address but when asked to recite her phone number, she shared her home address twice in response. SLP observed more perseveration in today's session compared to last session. SLP will continue to follow for cognitive treatment and will plan to visit patient earlier in the day.    HPI HPI: Patient is a 63 y.o. female presenting 11/14 with difficulty word finding and confusion. CTH 11/14 with acute intracranial hemorrhage in the left caudate nucleus with intraventricular extension; trace rightward midline shift. CTH 11/15 with bleed stable. PMH: HTN, CVA      SLP Plan  Continue with current plan of care          Recommendations                         Frequent or constant Supervision/Assistance Cognitive communication deficit (R41.841);Aphasia (R47.01)     Continue with current plan of care    Damien Hy  Graduate SLP Clinican

## 2024-09-01 NOTE — Progress Notes (Signed)
 Occupational Therapy Treatment Patient Details Name: Rhonda Cortez MRN: [id number] DOB: Mar 28, 1961 Today's Date: 09/01/2024   History of present illness Pt is a 63 y.o. female presenting 11/14 with difficulty word finding and confusion. CTH 11/14 with acute intracranial hemorrhage in the left caudate nucleus with intraventricular extension; trace rightward midline shift. CTH 11/15 with bleed stable. PMH: HTN, CVA   OT comments  Patient demonstrating good gains with OT treatment with bed mobility, toilet transfers and standing at sink for grooming tasks. Patient instructed in BUE strengthening HEP with level one therapy band and min cues to perform correctly.  Patient will benefit from intensive inpatient follow-up therapy, >3 hours/day.  Acute OT to continue to follow to address established goals to facilitate DC to next venue of care.        If plan is discharge home, recommend the following:  A little help with walking and/or transfers;A little help with bathing/dressing/bathroom;Assistance with cooking/housework;Assist for transportation;Help with stairs or ramp for entrance   Equipment Recommendations  Other (comment) (defer)    Recommendations for Other Services      Precautions / Restrictions Precautions Precautions: Fall;Other (comment) Recall of Precautions/Restrictions: Impaired Precaution/Restrictions Comments: SBP < 160 Restrictions Weight Bearing Restrictions Per Provider Order: No       Mobility Bed Mobility Overal bed mobility: Needs Assistance Bed Mobility: Sit to Supine       Sit to supine: Supervision, Used rails   General bed mobility comments: supervision with patient using rails to assist    Transfers Overall transfer level: Needs assistance Equipment used: Rolling walker (2 wheels) Transfers: Sit to/from Stand Sit to Stand: Contact guard assist           General transfer comment: CGA for balance and power up, cues to stay close to walker      Balance Overall balance assessment: Needs assistance Sitting-balance support: No upper extremity supported, Feet supported Sitting balance-Leahy Scale: Fair Sitting balance - Comments: donn socks seated on EOB with right lateral leaning but able to correct Postural control: Right lateral lean Standing balance support: Single extremity supported, Bilateral upper extremity supported, During functional activity Standing balance-Leahy Scale: Fair Standing balance comment: reliant on RW or sink for support                           ADL either performed or assessed with clinical judgement   ADL Overall ADL's : Needs assistance/impaired     Grooming: Wash/dry hands;Wash/dry face;Oral care;Contact guard assist;Standing Grooming Details (indicate cue type and reason): at sink             Lower Body Dressing: Minimal assistance;Sitting/lateral leans;Sit to/from stand Lower Body Dressing Details (indicate cue type and reason): to donn socks Toilet Transfer: Contact guard assist;Ambulation;Regular Toilet;Rolling walker (2 wheels) Toilet Transfer Details (indicate cue type and reason): cues for safety Toileting- Clothing Manipulation and Hygiene: Supervision/safety;Sitting/lateral lean       Functional mobility during ADLs: Contact guard assist;Rolling walker (2 wheels)      Extremity/Trunk Assessment              Vision       Perception     Praxis     Communication Communication Communication: Impaired Factors Affecting Communication: Reduced clarity of speech;Difficulty expressing self   Cognition Arousal: Alert Behavior During Therapy: WFL for tasks assessed/performed Cognition: Cognition impaired   Orientation impairments: Time Awareness: Intellectual awareness intact, Online awareness impaired  OT - Cognition Comments: alert and oriented x3                 Following commands: Impaired Following commands impaired: Follows multi-step  commands with increased time      Cueing   Cueing Techniques: Verbal cues  Exercises Exercises: General Upper Extremity General Exercises - Upper Extremity Shoulder Flexion: Strengthening, Both, 10 reps, Seated, Theraband Theraband Level (Shoulder Flexion): Level 1 (Yellow) Shoulder Horizontal ABduction: Strengthening, Both, 10 reps, Seated, Theraband Theraband Level (Shoulder Horizontal Abduction): Level 1 (Yellow) Elbow Flexion: Strengthening, Both, 5 reps, Seated, Theraband Theraband Level (Elbow Flexion): Level 1 (Yellow) Elbow Extension: Strengthening, Both, 5 reps, Seated, Theraband Theraband Level (Elbow Extension): Level 1 (Yellow)    Shoulder Instructions       General Comments patient stating nausea and headache but no episodes of emesis during session.    Pertinent Vitals/ Pain       Pain Assessment Pain Assessment: Faces Faces Pain Scale: Hurts a little bit Pain Location: Head Pain Descriptors / Indicators: Grimacing, Headache Pain Intervention(s): Limited activity within patient's tolerance, Monitored during session, Patient requesting pain meds-RN notified  Home Living                                          Prior Functioning/Environment              Frequency  Min 2X/week        Progress Toward Goals  OT Goals(current goals can now be found in the care plan section)  Progress towards OT goals: Progressing toward goals  Acute Rehab OT Goals Patient Stated Goal: to get better OT Goal Formulation: With patient Time For Goal Achievement: 09/12/24 Potential to Achieve Goals: Good ADL Goals Pt Will Perform Grooming: with supervision;standing Pt Will Perform Lower Body Dressing: sit to/from stand Pt Will Transfer to Toilet: with supervision;ambulating;regular height toilet Pt/caregiver will Perform Home Exercise Program: Increased strength;Both right and left upper extremity Additional ADL Goal #1: pt will follow 3 step commands  during ADL with min cues.  Plan      Co-evaluation                 AM-PAC OT 6 Clicks Daily Activity     Outcome Measure   Help from another person eating meals?: A Little Help from another person taking care of personal grooming?: A Little Help from another person toileting, which includes using toliet, bedpan, or urinal?: A Little Help from another person bathing (including washing, rinsing, drying)?: A Little Help from another person to put on and taking off regular upper body clothing?: A Little Help from another person to put on and taking off regular lower body clothing?: A Little 6 Click Score: 18    End of Session Equipment Utilized During Treatment: Gait belt;Rolling walker (2 wheels)  OT Visit Diagnosis: Unsteadiness on feet (R26.81);Muscle weakness (generalized) (M62.81);Other symptoms and signs involving cognitive function   Activity Tolerance Patient tolerated treatment well   Patient Left in chair;with call bell/phone within reach;with chair alarm set   Nurse Communication Mobility status;Patient requests pain meds        Time: 8980-8960 OT Time Calculation (min): 20 min  Charges: OT General Charges $OT Visit: 1 Visit OT Treatments $Self Care/Home Management : 8-22 mins  Dick Laine, OTA Acute Rehabilitation Services  Office [phone]   Jeb LITTIE Laine 09/01/2024, 10:48 AM

## 2024-09-01 NOTE — Progress Notes (Signed)
 STROKE TEAM PROGRESS NOTE    SIGNIFICANT HOSPITAL EVENTS 11/14- Admitted with small ICH  11/16-transferred out of the ICU  INTERIM HISTORY/SUBJECTIVE Patient is sitting up in bed comfortably.  She has no complaints.  She remains hemodynamically stable and afebrile.  She is awaiting insurance authorization to transfer to CIR.  OBJECTIVE  CBC    Component Value Date/Time   WBC 11.9 (H) 09/01/2024 0440   RBC 5.13 (H) 09/01/2024 0440   HGB 15.2 (H) 09/01/2024 0440   HCT 44.2 09/01/2024 0440   PLT 311 09/01/2024 0440   MCV 86.2 09/01/2024 0440   MCH 29.6 09/01/2024 0440   MCHC 34.4 09/01/2024 0440   RDW 11.9 09/01/2024 0440   LYMPHSABS 1.6 08/27/2024 1222   MONOABS 0.5 08/27/2024 1222   EOSABS 0.0 08/27/2024 1222   BASOSABS 0.0 08/27/2024 1222    BMET    Component Value Date/Time   NA 138 09/01/2024 0440   K 3.9 09/01/2024 0440   CL 99 09/01/2024 0440   CO2 23 09/01/2024 0440   GLUCOSE 126 (H) 09/01/2024 0440   BUN 24 (H) 09/01/2024 0440   CREATININE 2.20 (H) 09/01/2024 0440   CALCIUM  9.4 09/01/2024 0440   EGFR 33.0 05/12/2024 0000   GFRNONAA 25 (L) 09/01/2024 0440    IMAGING past 24 hours DG CHEST PORT 1 VIEW Result Date: 09/01/2024 EXAM: 1 VIEW XRAY OF THE CHEST 09/01/2024 10:03:48 AM COMPARISON: None available. CLINICAL HISTORY: Fever FINDINGS: LUNGS AND PLEURA: No focal pulmonary opacity. No pleural effusion. No pneumothorax. HEART AND MEDIASTINUM: No acute abnormality of the cardiac and mediastinal silhouettes. BONES AND SOFT TISSUES: No acute osseous abnormality. IMPRESSION: 1. No acute cardiopulmonary process identified. Electronically signed by: Waddell Calk MD 09/01/2024 01:37 PM EST RP Workstation: [id number]     Vitals:   09/01/24 0004 09/01/24 0445 09/01/24 0754 09/01/24 1216  BP: 133/87 (!) 142/87 137/89 (!) 140/84  Pulse: 92 93 89 86  Resp: 19 17 18    Temp: 100 F (37.8 C) 99.7 F (37.6 C) 98.6 F (37 C) 99.7 F (37.6 C)  TempSrc: Oral Oral Oral  Oral  SpO2: 98% 95% 97% 98%  Weight:      Height:         PHYSICAL EXAM General: Drowsy, well-nourished, well-developed patient in no acute distress Psych:  Mood and affect appropriate for situation CV: Regular rate and rhythm on monitor Respiratory:  Regular, unlabored respirations on room air GI: Abdomen soft and nontender   NEURO:  Mental Status: Drowsy but oriented to self, place, situation.  Responses somewhat slow Speech/Language: speech is without dysarthria  Mild difficulty with word finding  Cranial Nerves:  II: PERRL.  III, IV, VI: EOMI. Eyelids elevate symmetrically.  VII: Face is symmetrical resting and smiling VIII: hearing intact to voice. IX, X:Phonation is normal.  XII: tongue is midline without fasciculations. Motor: 5/5 strength to all muscle groups tested.  Tone: is normal and bulk is normal Sensation- Intact to light touch bilaterally. Coordination: FTN intact bilaterally Gait- deferred  Most Recent NIH 2     ASSESSMENT/PLAN  [name] is a 63 y.o. female with history of CKD and hypertension who presents with confusion and found to have a small head of the caudate hemorrhage.  She is currently awaiting insurance authorization and bed availability to transfer to CIR.  NIH on Admission 1  ICH:  Left caudate head CH with IVH, likely etiology:  hypertensive, but need to rule out AVM CT head -acute intracranial  hemorrhage with significant intraventricular extension from an acute bleed in the left caudate nucleus.  Mild asymmetric enlargement of the left lateral ventricle due to blood; temporal horns remain diminutive with trace rightward midline shift Repeat CT head - Caudate head hemorrhage with intraventricular extension  MRI - Focal area of intraparenchymal hemorrhage within the left caudate measuring approximately 1.7 x 1.1 x 1.9 cm, as seen on prior CT.  Intraventricular hemorrhage, primarily within the left lateral ventricle, but also layering  independently within the posterior horn of the right lateral ventricle and within the fourth ventricle, as seen on prior CT. No evidence of underlying mass. Extensive cerebral white matter disease. Small focus of hemosiderin staining present within each frontal lobe and within the left anteromedial cerebellar hemisphere. 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 Mild stenosis of the ophthalmic segments of the internal carotid arteries bilaterally. Mild-to-moderate focal stenosis of the mid basilar artery, approximately 30 to 40%.  2D Echo EF 60 to 65% Carotid US  -not done as patient uncooperative  LDL 164 HgbA1c 5.8 UDS negative VTE prophylaxis - SCDs  No antithrombotic prior to admission, now on No antithrombotic due to ICH Therapy recommendations:  CIR Disposition:  Pending   Hx of Stroke/TIA Stroke in 2014 - arm and leg weakness, difficulty walking Was on ASA after stroke for a number of years and then off No significant residual deficit  Hypertension  Home meds:  Norvasc, valsartan  Resume norvasc  Stable BP goal less than 160 Long-term BP goal normotensive  Hyperlipidemia Home meds:  pitavastatin  LDL 164, goal < 70 Start atorvastatin 40mg   CKD 3a Cr 1.97 - 1.9 - 2.19-1.95-1.81 GFR 31 Valsartan  on hold   Other Active Problems   Hospital day # 5   Patient is neurologically stable.  Transfer to inpatient rehab when bed available.  No family at the bedside.  Stroke team will sign off.  Kindly call for questions.  Discussed with Dr. Sonjia Eather Popp, MD Medical Director [facility] Pager: [phone] 09/01/2024 2:15 PM  To contact Stroke Continuity provider, please refer to [email or web address]. After hours, contact General Neurology

## 2024-09-01 NOTE — Plan of Care (Signed)

## 2024-09-01 NOTE — Plan of Care (Signed)
  Problem: Intracerebral Hemorrhage Tissue Perfusion: Goal: Complications of Intracerebral Hemorrhage will be minimized Outcome: Progressing   Problem: Coping: Goal: Will verbalize positive feelings about self Outcome: Progressing   Problem: Health Behavior/Discharge Planning: Goal: Ability to manage health-related needs will improve Outcome: Progressing   Problem: Self-Care: Goal: Verbalization of feelings and concerns over difficulty with self-care will improve Outcome: Progressing   Problem: Self-Care: Goal: Ability to communicate needs accurately will improve Outcome: Progressing   Problem: Nutrition: Goal: Risk of aspiration will decrease Outcome: Progressing   Problem: Activity: Goal: Risk for activity intolerance will decrease Outcome: Progressing   Problem: Coping: Goal: Level of anxiety will decrease Outcome: Progressing   Problem: Elimination: Goal: Will not experience complications related to bowel motility Outcome: Progressing   Problem: Safety: Goal: Ability to remain free from injury will improve Outcome: Progressing   Problem: Skin Integrity: Goal: Risk for impaired skin integrity will decrease Outcome: Progressing

## 2024-09-01 NOTE — Progress Notes (Signed)
 Inpatient Rehab Admissions Coordinator:   I did receive insurance approval for CIR.  Note tmax 100.6 and ongoing HA.  Will see how she does today.    Reche Lowers, PT, DPT Admissions Coordinator [phone] 09/01/24 10:13 AM

## 2024-09-02 ENCOUNTER — Other Ambulatory Visit: Payer: Self-pay

## 2024-09-02 ENCOUNTER — Encounter (HOSPITAL_COMMUNITY): Payer: Self-pay | Admitting: Physical Medicine & Rehabilitation

## 2024-09-02 ENCOUNTER — Inpatient Hospital Stay (HOSPITAL_COMMUNITY)
Admission: AD | Admit: 2024-09-02 | Discharge: 2024-09-04 | DRG: 057 | Disposition: A | Discharge status: Other Acute Inpatient Hospital | Source: Intra-hospital | Attending: Physical Medicine & Rehabilitation | Admitting: Physical Medicine & Rehabilitation

## 2024-09-02 DIAGNOSIS — I129 Hypertensive chronic kidney disease with stage 1 through stage 4 chronic kidney disease, or unspecified chronic kidney disease: Secondary | ICD-10-CM | POA: Diagnosis present

## 2024-09-02 DIAGNOSIS — E782 Mixed hyperlipidemia: Secondary | ICD-10-CM | POA: Diagnosis present

## 2024-09-02 DIAGNOSIS — K5900 Constipation, unspecified: Secondary | ICD-10-CM | POA: Diagnosis present

## 2024-09-02 DIAGNOSIS — N1831 Chronic kidney disease, stage 3a: Secondary | ICD-10-CM

## 2024-09-02 DIAGNOSIS — I618 Other nontraumatic intracerebral hemorrhage: Secondary | ICD-10-CM | POA: Diagnosis not present

## 2024-09-02 DIAGNOSIS — I779 Disorder of arteries and arterioles, unspecified: Secondary | ICD-10-CM | POA: Diagnosis not present

## 2024-09-02 DIAGNOSIS — I610 Nontraumatic intracerebral hemorrhage in hemisphere, subcortical: Secondary | ICD-10-CM

## 2024-09-02 DIAGNOSIS — Z825 Family history of asthma and other chronic lower respiratory diseases: Secondary | ICD-10-CM | POA: Diagnosis not present

## 2024-09-02 DIAGNOSIS — I69198 Other sequelae of nontraumatic intracerebral hemorrhage: Secondary | ICD-10-CM | POA: Diagnosis not present

## 2024-09-02 DIAGNOSIS — Z8673 Personal history of transient ischemic attack (TIA), and cerebral infarction without residual deficits: Secondary | ICD-10-CM

## 2024-09-02 DIAGNOSIS — N1832 Chronic kidney disease, stage 3b: Secondary | ICD-10-CM | POA: Diagnosis present

## 2024-09-02 DIAGNOSIS — Z833 Family history of diabetes mellitus: Secondary | ICD-10-CM

## 2024-09-02 DIAGNOSIS — G936 Cerebral edema: Secondary | ICD-10-CM | POA: Diagnosis not present

## 2024-09-02 DIAGNOSIS — G4089 Other seizures: Secondary | ICD-10-CM | POA: Diagnosis present

## 2024-09-02 DIAGNOSIS — Z8419 Family history of other disorders of kidney and ureter: Secondary | ICD-10-CM | POA: Diagnosis not present

## 2024-09-02 DIAGNOSIS — I69120 Aphasia following nontraumatic intracerebral hemorrhage: Secondary | ICD-10-CM

## 2024-09-02 DIAGNOSIS — Z8679 Personal history of other diseases of the circulatory system: Secondary | ICD-10-CM | POA: Diagnosis not present

## 2024-09-02 DIAGNOSIS — Z8249 Family history of ischemic heart disease and other diseases of the circulatory system: Secondary | ICD-10-CM | POA: Diagnosis not present

## 2024-09-02 DIAGNOSIS — E785 Hyperlipidemia, unspecified: Secondary | ICD-10-CM | POA: Diagnosis not present

## 2024-09-02 DIAGNOSIS — D72829 Elevated white blood cell count, unspecified: Secondary | ICD-10-CM | POA: Diagnosis not present

## 2024-09-02 DIAGNOSIS — Z79899 Other long term (current) drug therapy: Secondary | ICD-10-CM | POA: Diagnosis not present

## 2024-09-02 DIAGNOSIS — I619 Nontraumatic intracerebral hemorrhage, unspecified: Principal | ICD-10-CM | POA: Diagnosis present

## 2024-09-02 DIAGNOSIS — I10 Essential (primary) hypertension: Secondary | ICD-10-CM

## 2024-09-02 DIAGNOSIS — K5901 Slow transit constipation: Secondary | ICD-10-CM

## 2024-09-02 DIAGNOSIS — R531 Weakness: Secondary | ICD-10-CM | POA: Diagnosis present

## 2024-09-02 DIAGNOSIS — N179 Acute kidney failure, unspecified: Secondary | ICD-10-CM | POA: Diagnosis present

## 2024-09-02 DIAGNOSIS — I63532 Cerebral infarction due to unspecified occlusion or stenosis of left posterior cerebral artery: Secondary | ICD-10-CM | POA: Diagnosis not present

## 2024-09-02 DIAGNOSIS — R509 Fever, unspecified: Secondary | ICD-10-CM | POA: Diagnosis not present

## 2024-09-02 DIAGNOSIS — I951 Orthostatic hypotension: Secondary | ICD-10-CM | POA: Diagnosis not present

## 2024-09-02 DIAGNOSIS — R569 Unspecified convulsions: Secondary | ICD-10-CM | POA: Diagnosis not present

## 2024-09-02 LAB — URINALYSIS, ROUTINE W REFLEX MICROSCOPIC
Bilirubin Urine: NEGATIVE
Glucose, UA: NEGATIVE mg/dL
Hgb urine dipstick: NEGATIVE
Ketones, ur: NEGATIVE mg/dL
Nitrite: NEGATIVE
Protein, ur: 100 mg/dL — AB
Specific Gravity, Urine: 1.017 (ref 1.005–1.030)
pH: 5 (ref 5.0–8.0)

## 2024-09-02 LAB — CBC
HCT: 42.5 % (ref 36.0–46.0)
Hemoglobin: 14.6 g/dL (ref 12.0–15.0)
MCH: 29.4 pg (ref 26.0–34.0)
MCHC: 34.4 g/dL (ref 30.0–36.0)
MCV: 85.5 fL (ref 80.0–100.0)
Platelets: 294 K/uL (ref 150–400)
RBC: 4.97 MIL/uL (ref 3.87–5.11)
RDW: 11.8 % (ref 11.5–15.5)
WBC: 12.2 K/uL — ABNORMAL HIGH (ref 4.0–10.5)
nRBC: 0 % (ref 0.0–0.2)

## 2024-09-02 LAB — BASIC METABOLIC PANEL WITH GFR
Anion gap: 12 (ref 5–15)
BUN: 27 mg/dL — ABNORMAL HIGH (ref 8–23)
CO2: 23 mmol/L (ref 22–32)
Calcium: 9 mg/dL (ref 8.9–10.3)
Chloride: 98 mmol/L (ref 98–111)
Creatinine, Ser: 1.84 mg/dL — ABNORMAL HIGH (ref 0.44–1.00)
GFR, Estimated: 31 mL/min — ABNORMAL LOW (ref >60)
Glucose, Bld: 135 mg/dL — ABNORMAL HIGH (ref 70–99)
Potassium: 3.9 mmol/L (ref 3.5–5.1)
Sodium: 133 mmol/L — ABNORMAL LOW (ref 135–145)

## 2024-09-02 LAB — PHOSPHORUS: Phosphorus: 3.6 mg/dL (ref 2.5–4.6)

## 2024-09-02 LAB — MAGNESIUM: Magnesium: 2 mg/dL (ref 1.7–2.4)

## 2024-09-02 MED ORDER — TRAZODONE HCL 50 MG PO TABS
25.0000 mg | ORAL_TABLET | Freq: Every evening | ORAL | Status: DC | PRN
  Filled 2024-09-02: qty 1

## 2024-09-02 MED ORDER — FLEET ENEMA RE ENEM: 1.0000 | ENEMA | Freq: Once | RECTAL | Status: DC | PRN

## 2024-09-02 MED ORDER — AMLODIPINE BESYLATE 10 MG PO TABS
10.0000 mg | ORAL_TABLET | Freq: Every day | ORAL | Status: DC
  Administered 2024-09-03 – 2024-09-04 (×2): 10 mg via ORAL
  Filled 2024-09-02 (×2): qty 1

## 2024-09-02 MED ORDER — ATORVASTATIN CALCIUM 40 MG PO TABS: 40.0000 mg | ORAL_TABLET | Freq: Every day | ORAL | Status: DC

## 2024-09-02 MED ORDER — ACETAMINOPHEN 325 MG PO TABS
325.0000 mg | ORAL_TABLET | ORAL | Status: DC | PRN
  Filled 2024-09-02: qty 2

## 2024-09-02 MED ORDER — SENNA 8.6 MG PO TABS: 1.0000 | ORAL_TABLET | Freq: Two times a day (BID) | ORAL | Status: DC

## 2024-09-02 MED ORDER — DIPHENHYDRAMINE HCL 25 MG PO CAPS: 25.0000 mg | ORAL_CAPSULE | Freq: Four times a day (QID) | ORAL | Status: DC | PRN

## 2024-09-02 MED ORDER — SENNOSIDES-DOCUSATE SODIUM 8.6-50 MG PO TABS
1.0000 | ORAL_TABLET | Freq: Two times a day (BID) | ORAL | Status: DC
  Administered 2024-09-02 – 2024-09-04 (×4): 1 via ORAL
  Filled 2024-09-02 (×4): qty 1

## 2024-09-02 MED ORDER — POLYETHYLENE GLYCOL 3350 17 G PO PACK: 17.0000 g | PACK | Freq: Every day | ORAL | Status: DC | PRN

## 2024-09-02 MED ORDER — ACETAMINOPHEN 325 MG PO TABS: 650.0000 mg | ORAL_TABLET | ORAL | Status: DC | PRN

## 2024-09-02 MED ORDER — ATORVASTATIN CALCIUM 40 MG PO TABS
40.0000 mg | ORAL_TABLET | Freq: Every day | ORAL | Status: DC
  Administered 2024-09-03 – 2024-09-04 (×2): 40 mg via ORAL
  Filled 2024-09-02 (×2): qty 1

## 2024-09-02 MED ORDER — ZINC SULFATE 220 (50 ZN) MG PO CAPS
220.0000 mg | ORAL_CAPSULE | Freq: Every day | ORAL | Status: DC
  Administered 2024-09-02 – 2024-09-04 (×3): 220 mg via ORAL
  Filled 2024-09-02 (×3): qty 1

## 2024-09-02 MED ORDER — OXYCODONE HCL 5 MG PO TABS
5.0000 mg | ORAL_TABLET | Freq: Four times a day (QID) | ORAL | Status: DC | PRN
  Administered 2024-09-03: 5 mg via ORAL
  Filled 2024-09-02: qty 1

## 2024-09-02 MED ORDER — BLOOD PRESSURE CONTROL BOOK
Freq: Once | Status: AC
  Filled 2024-09-02: qty 1

## 2024-09-02 MED ORDER — PANTOPRAZOLE SODIUM 40 MG PO TBEC: 40.0000 mg | DELAYED_RELEASE_TABLET | Freq: Every day | ORAL | Status: DC

## 2024-09-02 MED ORDER — ALUM & MAG HYDROXIDE-SIMETH 200-200-20 MG/5ML PO SUSP: 30.0000 mL | ORAL | Status: DC | PRN

## 2024-09-02 MED ORDER — PROCHLORPERAZINE 25 MG RE SUPP: 12.5000 mg | Freq: Four times a day (QID) | RECTAL | Status: DC | PRN

## 2024-09-02 MED ORDER — SENNOSIDES-DOCUSATE SODIUM 8.6-50 MG PO TABS: 1.0000 | ORAL_TABLET | Freq: Two times a day (BID) | ORAL | Status: DC

## 2024-09-02 MED ORDER — POLYETHYLENE GLYCOL 3350 17 G PO PACK
17.0000 g | PACK | Freq: Every day | ORAL | Status: DC
  Administered 2024-09-02 – 2024-09-03 (×2): 17 g via ORAL
  Filled 2024-09-02 (×2): qty 1

## 2024-09-02 MED ORDER — PROCHLORPERAZINE MALEATE 5 MG PO TABS: 5.0000 mg | ORAL_TABLET | Freq: Four times a day (QID) | ORAL | Status: DC | PRN

## 2024-09-02 MED ORDER — PROCHLORPERAZINE EDISYLATE 10 MG/2ML IJ SOLN: 5.0000 mg | Freq: Four times a day (QID) | INTRAMUSCULAR | Status: DC | PRN

## 2024-09-02 MED ORDER — OXYCODONE HCL 5 MG PO TABS: 5.0000 mg | ORAL_TABLET | Freq: Four times a day (QID) | ORAL | Status: DC | PRN

## 2024-09-02 MED ORDER — BISACODYL 10 MG RE SUPP
10.0000 mg | Freq: Every day | RECTAL | Status: DC | PRN
Start: 2024-09-02 — End: 2024-09-04

## 2024-09-02 MED ORDER — VITAMIN C 500 MG PO TABS
1000.0000 mg | ORAL_TABLET | Freq: Every day | ORAL | Status: DC
  Administered 2024-09-02 – 2024-09-04 (×3): 1000 mg via ORAL
  Filled 2024-09-02 (×3): qty 2

## 2024-09-02 MED ORDER — GUAIFENESIN-DM 100-10 MG/5ML PO SYRP: 5.0000 mL | ORAL_SOLUTION | Freq: Four times a day (QID) | ORAL | Status: DC | PRN

## 2024-09-02 MED ORDER — ORAL CARE MOUTH RINSE: 15.0000 mL | OROMUCOSAL | Status: DC | PRN

## 2024-09-02 MED ORDER — PANTOPRAZOLE SODIUM 40 MG PO TBEC
40.0000 mg | DELAYED_RELEASE_TABLET | Freq: Every day | ORAL | Status: DC
  Administered 2024-09-02 – 2024-09-03 (×2): 40 mg via ORAL
  Filled 2024-09-02 (×2): qty 1

## 2024-09-02 NOTE — Progress Notes (Signed)
 Babs Arthea DASEN, MD  Physician Physical Medicine and Rehabilitation   PMR Pre-admission    Signed   Date of Service: 09/02/2024 10:21 AM  Related encounter: ED to Hosp-Admission (Discharged) from 08/27/2024 in [facility]   Signed     Expand All Collapse All  PMR Admission Coordinator Pre-Admission Assessment   Patient: Rhonda Cortez is an 63 y.o., female MRN: [id number] DOB: 1961/10/02 Height: 5' 3 (160 cm) Weight: 63.6 kg   Insurance Information HMO:     PPO:      PCP:      IPA:      80/20:      OTHER:  PRIMARY: [employer]      Policy#: [id number]      Subscriber: pt CM NameBETHA Browning      Phone#: [phone]     Fax#: [phone] Pre-Cert#: [id number] auth for CIR from [facility] with [employer] for admit 11/20 with next review date 11/26.  Updates due to [place] at fax listed above.        Employer:  Benefits:  Phone #: [phone]     Name:  Eff. Date: 10/15/23     Deduct: $1250 ($0 met)      Out of Pocket Max: [phone] ($356.99 met)      Life Max: n/a CIR: $300/admit      SNF: 80% Outpatient:      Co-Pay: $52/visit Home Health: 80%      Co-Pay: 20% DME: 80%     Co-Pay: 20% Providers:  SECONDARY:       Policy#:      Phone#:    Artist:       Phone#:    [employer] Information Summary" for patients in Inpatient Rehabilitation Facilities with attached "Privacy Act Statement-Health Care Records" was provided and verbally reviewed with: Patient and Family   Emergency Contact Information Contact Information       Name Relation Home Work Mobile    [place] Son     [phone]    Metta Boyce Ahumada [phone]             Other Contacts   None on File        Current Medical History  Patient Admitting Diagnosis: IPH    History of Present Illness: Pt is a 63 y/o female with PMH of HTN who presented to [facility] on 11/14 with AMS.  Per family she was supposed to arrive at a friend's home around 7pm that night but did not  arrive.  When pt answered her phone around 3AM she was quite confused.  In ED head CT showed acute intracranial hemorrhage from the left caudate region with extension into the intraventricular region on the left.  Repeat head CT showed intraventricular extension of hemorrhage.  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  Neurology recommended no antithrombotic 2/2 ICH .  Therapy evaluations completed and pt was recommended for CIR.    Complete NIHSS TOTAL: 1   Patient's medical record from [facility] has been reviewed by the rehabilitation admission coordinator and physician.   Past Medical History      Past Medical History:  Diagnosis Date   Hypertension     Stroke [facility])            Has the patient had major surgery during 100 days prior to admission? No   Family History  family history includes Asthma in her son; Diabetes in her mother; Hypertension in her brother, father, and sister; Kidney disease in her father.   Current Medications  Current Medications    Current Facility-Administered Medications:    acetaminophen (TYLENOL) tablet 650 mg, 650 mg, Oral, Q4H PRN, 650 mg at 08/27/24 2304 **OR** acetaminophen (TYLENOL) 160 MG/5ML solution 650 mg, 650 mg, Per Tube, Q4H PRN **OR** acetaminophen (TYLENOL) suppository 650 mg, 650 mg, Rectal, Q4H PRN, Michaela Aisha SQUIBB, MD   amLODipine (NORVASC) tablet 10 mg, 10 mg, Oral, Daily, Michaela Aisha SQUIBB, MD, 10 mg at 08/30/24 0950   atorvastatin (LIPITOR) tablet 40 mg, 40 mg, Oral, Daily, Sethi, Pramod S, MD, 40 mg at 08/30/24 0950   labetalol (NORMODYNE) injection 10 mg, 10 mg, Intravenous, Q10 min PRN, Michaela Aisha SQUIBB, MD   ondansetron (ZOFRAN) injection 4 mg, 4 mg, Intravenous, Q6H PRN, Khaliqdina, Salman, MD   Oral care mouth rinse, 15 mL, Mouth Rinse, PRN, Arora, Ashish, MD   pantoprazole (PROTONIX) EC tablet 40 mg, 40 mg, Oral, QHS, Uhlorn, Garett M, RPH,  40 mg at 08/28/24 2105   senna-docusate (Senokot-S) tablet 1 tablet, 1 tablet, Oral, BID, Michaela Aisha SQUIBB, MD, 1 tablet at 08/30/24 9048     Patients Current Diet:  Diet Order                  Diet regular Room service appropriate? Yes; Fluid consistency: Thin  Diet effective now                         Precautions / Restrictions Precautions Precautions: Fall, Other (comment) Precaution/Restrictions Comments: SBP < 160 Restrictions Weight Bearing Restrictions Per Provider Order: No    Has the patient had 2 or more falls or a fall with injury in the past year? No   Prior Activity Level Community (5-7x/wk): fully independent, no DME, driving, working   Prior Functional Level Self Care: Did the patient need help bathing, dressing, using the toilet or eating? Independent   Indoor Mobility: Did the patient need assistance with walking from room to room (with or without device)? Independent   Stairs: Did the patient need assistance with internal or external stairs (with or without device)? Independent   Functional Cognition: Did the patient need help planning regular tasks such as shopping or remembering to take medications? Independent   Patient Information Are you of Hispanic, Latino/a,or Spanish origin?: A. No, not of Hispanic, Latino/a, or Spanish origin What is your race?: B. Black or African American Do you need or want an interpreter to communicate with a doctor or health care staff?: 0. No   Patient's Response To:  Health Literacy and Transportation Is the patient able to respond to health literacy and transportation needs?: Yes Health Literacy - How often do you need to have someone help you when you read instructions, pamphlets, or other written material from your doctor or pharmacy?: Never In the past 12 months, has lack of transportation kept you from medical appointments or from getting medications?: No In the past 12 months, has lack of transportation  kept you from meetings, work, or from getting things needed for daily living?: No   Journalist, Newspaper / Equipment Home Equipment: Shower seat, Grab bars - tub/shower, Hand held shower head   Prior Device Use: Indicate devices/aids used by the patient prior to current illness, exacerbation or injury? None of the above   Current Functional Level Cognition   Arousal/Alertness:  Awake/alert Overall Cognitive Status: Impaired/Different from baseline Orientation Level: Oriented X4 Attention: Selective Selective Attention: Impaired Selective Attention Impairment: Verbal complex Memory: Impaired Memory Impairment: Storage deficit, Decreased recall of new information Awareness: Impaired Awareness Impairment: Intellectual impairment Problem Solving: Impaired Problem Solving Impairment: Verbal basic Executive Function: Initiating Initiating: Impaired Initiating Impairment: Verbal basic, Functional basic    Extremity Assessment (includes Sensation/Coordination)   Upper Extremity Assessment: RUE deficits/detail, LUE deficits/detail RUE Deficits / Details: pt reports prior CVA resulted in R sided weakness, overall WFL during session, 4/5 push/shoulder flexion. LUE Deficits / Details: mildly decreased coordination with finger to nose testing  Lower Extremity Assessment: Defer to PT evaluation RLE Deficits / Details: reports prior CVA resulted in R-sided weakness and endorses residual deficits but MMT scores symmetrical bil with 4+ hip flexion, 5 knee extension, 5 ankle dorsiflexion; sensation intact bil; dysdiadochokinesia and dysmetria noted on R; delayed but accurate proprioception in ankle RLE Sensation: decreased proprioception RLE Coordination: decreased fine motor, decreased gross motor     ADLs   Overall ADL's : Needs assistance/impaired Eating/Feeding: Set up, Sitting Grooming: Minimal assistance, Standing Upper Body Bathing: Set up, Sitting Lower Body Bathing: Minimal  assistance, Sitting/lateral leans, Sit to/from stand Upper Body Dressing : Set up, Sitting Lower Body Dressing: Minimal assistance, Sitting/lateral leans, Sit to/from stand Toilet Transfer: Minimal assistance, Ambulation, Stand-pivot, BSC/3in1 Functional mobility during ADLs: Minimal assistance     Mobility   Overal bed mobility: Needs Assistance Bed Mobility: Sit to Supine Sit to supine: Supervision General bed mobility comments: Supervision for safety returning to supine from sitting EOB     Transfers   Overall transfer level: Needs assistance Equipment used: None Transfers: Sit to/from Stand Sit to Stand: Min assist General transfer comment: Pt leaning her legs posteriorly against wheelchair for support with sit to stand, minA for balance and cues to shift weight anteriorly     Ambulation / Gait / Stairs / Wheelchair Mobility   Ambulation/Gait Ambulation/Gait assistance: Min assist, Mod assist, Contact guard assist Gait Distance (Feet): 110 Feet Assistive device: None, Rolling walker (2 wheels) Gait Pattern/deviations: Step-through pattern, Decreased dorsiflexion - right, Decreased stride length, Staggering right, Drifts right/left, Narrow base of support, Decreased step length - right, Decreased step length - left General Gait Details: Pt ambulates with slow, small, narrow bil steps with slightly decreased R dorsiflexion during swing phase. Pt has a tendency to drift and stagger to the R with and without the RW. Verbal and tactile cues provided to widen stance. Pt also would sway and stagger posteriorly intermittently. Min-modA to ambulate ~6 ft without UE support. CGA-minA to ambulate with RW the remaining distance. Gait velocity: reduced Gait velocity interpretation: <1.31 ft/sec, indicative of household ambulator     Posture / Balance Dynamic Sitting Balance Sitting balance - Comments: posterior lean with dynamic tasks, supervision sitting statically Balance Overall balance  assessment: Needs assistance Sitting-balance support: No upper extremity supported, Feet supported Sitting balance-Leahy Scale: Fair Sitting balance - Comments: posterior lean with dynamic tasks, supervision sitting statically Postural control: Posterior lean, Right lateral lean Standing balance support: Bilateral upper extremity supported, No upper extremity supported, During functional activity Standing balance-Leahy Scale: Poor Standing balance comment: reliant on RW and staggers R and posteriorly with minA to recover when using RW, up to modA to recover without UE support     Special considerations/life events  N/a    Previous Home Environment (from acute therapy documentation) Living Arrangements: Alone  Lives With: Alone Available Help at Discharge: Family, Available  24 hours/day Type of Home: House Home Layout: One level Home Access: Stairs to enter Entrance Stairs-Rails: None Entrance Stairs-Number of Steps: 2 Bathroom Shower/Tub: Tub/shower unit, Health Visitor: Standard Home Care Services: No   Discharge Living Setting Plans for Discharge Living Setting: Patient's home, Lives with (comment) (family to come stay with her) Type of Home at Discharge: House Discharge Home Layout: One level Discharge Home Access: Stairs to enter Entrance Stairs-Rails: Right, Left Entrance Stairs-Number of Steps: 8+ Discharge Bathroom Shower/Tub: Tub/shower unit, Walk-in shower Discharge Bathroom Toilet: Standard Discharge Bathroom Accessibility: Yes How Accessible: Accessible via walker Does the patient have any problems obtaining your medications?: No   Social/Family/Support Systems Anticipated Caregiver: children to rotate staying with her Anticipated Caregiver's Contact Information: Chance [phone] Ability/Limitations of Caregiver: none stated Caregiver Availability: 24/7 Discharge Plan Discussed with Primary Caregiver: Yes Is Caregiver In Agreement with Plan?:  Yes Does Caregiver/Family have Issues with Lodging/Transportation while Pt is in Rehab?: No   Goals Patient/Family Goal for Rehab: PT/OT/SLP supervision to mod I Expected length of stay: 7-10 days Additional Information: Discharge plan: pt to return to her own home, her children (2 sons, a daughter, and daughter in law) will rotate to stay with her at discharge to ensure she has 24/7 Pt/Family Agrees to Admission and willing to participate: Yes Program Orientation Provided & Reviewed with Pt/Caregiver Including Roles  & Responsibilities: Yes   Decrease burden of Care through IP rehab admission: n/a   Possible need for SNF placement upon discharge:  Not anticipated.  Plan for home to pt's home with her children rotating to provide her with 24/7 supervision.    Patient Condition: This patient's medical and functional status has changed since the consult dated: 08/30/24 in which the Rehabilitation Physician determined and documented that the patient's condition is appropriate for intensive rehabilitative care in an inpatient rehabilitation facility. See History of Present Illness (above) for medical update. Functional changes are: pt CGA to min assist for mobility . Patient's medical and functional status update has been discussed with the Rehabilitation physician and patient remains appropriate for inpatient rehabilitation. Will admit to inpatient rehab today.   Preadmission Screen Completed By:  Reche FORBES Lowers, 08/30/2024 3:13 PM ______________________________________________________________________   Discussed status with Dr. Babs on 09/02/24  at 10:21 AM  and received approval for admission today.   Admission Coordinator:  Zakai Gonyea E Lizbett Garciagarcia, PT, time 10:21 AM Pattricia 09/02/24     Assessment/Plan: Diagnosis: left caudate ICH Does the need for close, 24 hr/day Medical supervision in concert with the patient's rehab needs make it unreasonable for this patient to be served in a less intensive  setting? Yes Co-Morbidities requiring supervision/potential complications: HTN, previous stroke Due to bladder management, bowel management, safety, skin/wound care, disease management, medication administration, pain management, and patient education, does the patient require 24 hr/day rehab nursing? Yes Does the patient require coordinated care of a physician, rehab nurse, PT, OT, and SLP to address physical and functional deficits in the context of the above medical diagnosis(es)? Yes Addressing deficits in the following areas: balance, endurance, locomotion, strength, transferring, bowel/bladder control, bathing, dressing, feeding, grooming, toileting, cognition, and psychosocial support Can the patient actively participate in an intensive therapy program of at least 3 hrs of therapy 5 days a week? Yes The potential for patient to make measurable gains while on inpatient rehab is excellent Anticipated functional outcomes upon discharge from inpatient rehab: modified independent and supervision PT, modified independent and supervision OT, modified independent and supervision  SLP Estimated rehab length of stay to reach the above functional goals is: 7-10 days Anticipated discharge destination: Home 10. Overall Rehab/Functional Prognosis: excellent     MD Signature: Arthea IVAR Gunther, MD, [facility] Physical Medicine & Rehabilitation Medical Director Rehabilitation Services 09/02/2024           Revision History  Date/Time User Provider Type Action  09/02/2024 12:16 PM Gunther Arthea DASEN, MD Physician Sign  09/02/2024 10:21 AM Butler Reche BRAVO, PT Rehab Admission Coordinator Share  08/31/2024  3:54 PM Airyanna Dipalma E, PT Rehab Admission Coordinator Share   View Details Report

## 2024-09-02 NOTE — Progress Notes (Addendum)
 Patient transferring to [id number], report given. Notified patients son Chance we transferred her. Keiasia Christianson, Cena Helling, RN

## 2024-09-02 NOTE — H&P (Signed)
 Physical Medicine and Rehabilitation Admission H&P    Chief Complaint  Patient presents with   Functional Debility Intraparenchymal Hemorrhage     HPI: Rhonda Cortez is a 63 year old female with PMHx of hypertension, HLD, and history of CVA in 2014 presented to [facility] on 08/27/2024.  Per chart review the patient was supposed to arrive at a friend's home around 7 PM that night but did not arrive.  When the patient answered her phone around 3 AM she was confused and had difficulty finding words.  In the ED a head CT showed acute intracranial hemorrhage from the left caudate region with extension of the intraventricular region on the left.  Neurology was consulted and the patient was admitted to the ICU.  A repeat CT head showed caudate head hemorrhage with intraventricular extension.  Patient underwent an MRI that confirmed intraventricular hemorrhage, no evidence of underlying mass. 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  Neurology recommended no antithrombotics due to ICH.  Patient was on aspirin  after stroke (2014) for a number of years and then off. VTE prophylaxis SCDs. 2D echo with EF 60 to 65%.  Hospital course complicated by AKI, ongoing headaches, and new fever of unknown origin.  Prior to arrival the patient was fully independent working and driving with no use of DME.  She lives alone in a 1 level home with 2 stairs to enter.  Patient currently requires contact-guard assist to min assist for mobility.Therapy evaluations completed due to patient decreased functional mobility was admitted for a comprehensive rehab program.     Review of Systems  Constitutional:  Positive for malaise/fatigue.  HENT: Negative.    Eyes: Negative.   Respiratory: Negative.    Cardiovascular: Negative.   Gastrointestinal: Negative.   Genitourinary: Negative.   Musculoskeletal: Negative.   Skin: Negative.   Neurological:   Positive for sensory change and focal weakness.  Psychiatric/Behavioral:  Positive for depression.         Past Medical History:  Diagnosis Date   Hypertension    Stroke [facility])    Past Surgical History:  Procedure Laterality Date   OOPHORECTOMY     TUBAL LIGATION     Family History  Problem Relation Age of Onset   Diabetes Mother    Kidney disease Father    Hypertension Father    Hypertension Sister    Hypertension Brother    Asthma Son    Social History:  reports that she has never smoked. She has never used smokeless tobacco. She reports that she does not drink alcohol and does not use drugs. Allergies: No Known Allergies Medications Prior to Admission  Medication Sig Dispense Refill   amLODipine  (NORVASC ) 10 MG tablet Take 10 mg by mouth daily.     Cholecalciferol (VITAMIN D-3 PO) Take 1 capsule by mouth daily.     triamcinolone  ointment (KENALOG ) 0.1 % Apply 1 Application topically 2 (two) times daily as needed (skin irritation).     [Paused] valsartan  (DIOVAN ) 80 MG tablet Take 1 tablet (80 mg total) by mouth daily. (Patient taking differently: Take 80 mg by mouth at bedtime.) 30 tablet 2   Pitavastatin  Calcium  1 MG TABS Take 1 tablet (1 mg total) by mouth daily. START with 1 pill Monday, Wednesday, Friday for 2 weeks, then increase to every other day for 2 weeks, then take daily. (Patient not taking: Reported on 08/28/2024) 90  tablet 0      Home: Home Living Family/patient expects to be discharged to:: Inpatient rehab Living Arrangements: Alone Available Help at Discharge: Family, Available 24 hours/day Type of Home: House Home Access: Stairs to enter [employer] of Steps: 2 Entrance Stairs-Rails: None Home Layout: One level Bathroom Shower/Tub: Tub/shower unit, Health Visitor: Standard Home Equipment: Information systems manager, Grab bars - tub/shower, Hand held shower head  Lives With: Alone   Functional History: Prior Function Prior Level  of Function : Independent/Modified Independent, Driving, Working/employed Mobility Comments: No AD ADLs Comments: 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 Independent in ADL, IADL, working, driving  Functional Status:  Mobility: Bed Mobility Overal bed mobility: Needs Assistance Bed Mobility: Supine to Sit, Sit to Supine Supine to sit: Contact guard, Used rails Sit to supine: Supervision General bed mobility comments: increased time Transfers Overall transfer level: Needs assistance Equipment used: None Transfers: Sit to/from Stand Sit to Stand: Contact guard assist General transfer comment: CGA for balance and power up, cues to stay close to walker Ambulation/Gait Ambulation/Gait assistance: Contact guard assist, Min assist Gait Distance (Feet): 36 Feet Assistive device: None, 1 person hand held assist Gait Pattern/deviations: Step-through pattern, Decreased stride length General Gait Details: Slow, unsteady gait without AD and pt intermittently reaching out to environment for support so HHA provided. Gait velocity: reduced Gait velocity interpretation: 1.31 - 2.62 ft/sec, indicative of limited community ambulator    ADL: ADL Overall ADL's : Needs assistance/impaired Eating/Feeding: Set up, Sitting Grooming: Wash/dry hands, Wash/dry face, Oral care, Contact guard assist, Standing Grooming Details (indicate cue type and reason): at sink Upper Body Bathing: Set up, Sitting Lower Body Bathing: Minimal assistance, Sitting/lateral leans, Sit to/from stand Upper Body Dressing : Set up, Sitting Lower Body Dressing: Minimal assistance, Sitting/lateral leans, Sit to/from stand Lower Body Dressing Details (indicate cue type and reason): to donn socks Toilet Transfer: Contact guard assist, Ambulation, Regular Toilet, Rolling walker (2 wheels) Toilet Transfer Details (indicate cue type and reason): cues for safety Toileting- Clothing Manipulation and Hygiene: Supervision/safety, Sitting/lateral  lean Functional mobility during ADLs: Contact guard assist, Rolling walker (2 wheels)  Cognition: Cognition Overall Cognitive Status: Impaired/Different from baseline Arousal/Alertness: Awake/alert Orientation Level: Oriented to person, Oriented to place, Disoriented to time, Disoriented to situation Year: Other (Comment) (94 or 95) Month: November Day of Week: Correct Attention: Selective Selective Attention: Impaired Selective Attention Impairment: Verbal complex Memory: Impaired Memory Impairment: Storage deficit, Decreased recall of new information Awareness: Impaired Awareness Impairment: Intellectual impairment Problem Solving: Impaired Problem Solving Impairment: Verbal basic Executive Function: Initiating Initiating: Impaired Initiating Impairment: Verbal basic, Functional basic Cognition Arousal: Alert Behavior During Therapy: WFL for tasks assessed/performed Overall Cognitive Status: Impaired/Different from baseline  Physical Exam: Blood pressure 134/81, pulse 88, temperature 99.1 F (37.3 C), temperature source Oral, resp. rate 18, height 5' 3 (1.6 m), weight 63.6 kg, SpO2 97%. Physical Exam Constitutional:      General: She is not in acute distress.    Comments: Slow to arouse  HENT:     Right Ear: External ear normal.     Left Ear: External ear normal.     Nose: Nose normal.     Mouth/Throat:     Mouth: Mucous membranes are moist.  Eyes:     Conjunctiva/sclera: Conjunctivae normal.  Cardiovascular:     Rate and Rhythm: Normal rate and regular rhythm.     Heart sounds:     No gallop.  Pulmonary:     Effort: Pulmonary effort  is normal. No respiratory distress.     Breath sounds: No wheezing.  Abdominal:     General: Bowel sounds are normal. There is no distension.     Tenderness: There is no abdominal tenderness.  Musculoskeletal:        General: No deformity. Normal range of motion.     Cervical back: Normal range of motion.  Skin:    General:  Skin is warm.     Coloration: Skin is not jaundiced.  Neurological:     Comments: Pt slow to arouse today. Oriented to person, place, reason she was here. Speech is low volume but clear. No focal CN findings. Senses pain and gross touch in all 4's. No limb ataxia. Normal muscle tone. MMT: pt is grossly 4+ to 5/5 in all 4's.      Psychiatric:     Comments: Flat,does cooperate.      Results for orders placed or performed during the hospital encounter of 08/27/24 (from the past 48 hours)  Basic metabolic panel with GFR     Status: Abnormal   Collection Time: 09/01/24  4:40 AM  Result Value Ref Range   Sodium 138 135 - 145 mmol/L   Potassium 3.9 3.5 - 5.1 mmol/L   Chloride 99 98 - 111 mmol/L   CO2 23 22 - 32 mmol/L   Glucose, Bld 126 (H) 70 - 99 mg/dL    Comment: Glucose reference range applies only to samples taken after fasting for at least 8 hours.   BUN 24 (H) 8 - 23 mg/dL   Creatinine, Ser 7.79 (H) 0.44 - 1.00 mg/dL   Calcium  9.4 8.9 - 10.3 mg/dL   GFR, Estimated 25 (L) >60 mL/min    Comment: (NOTE) Calculated using the CKD-EPI Creatinine Equation (2021)    Anion gap 16 (H) 5 - 15    Comment: Performed at [facility], [address] [place], [place] [address]  CBC     Status: Abnormal   Collection Time: 09/01/24  4:40 AM  Result Value Ref Range   WBC 11.9 (H) 4.0 - 10.5 K/uL   RBC 5.13 (H) 3.87 - 5.11 MIL/uL   Hemoglobin 15.2 (H) 12.0 - 15.0 g/dL   HCT 55.7 63.9 - 53.9 %   MCV 86.2 80.0 - 100.0 fL   MCH 29.6 26.0 - 34.0 pg   MCHC 34.4 30.0 - 36.0 g/dL   RDW 88.0 88.4 - 84.4 %   Platelets 311 150 - 400 K/uL   nRBC 0.0 0.0 - 0.2 %    Comment: Performed at [facility], [address], [place], [place] [address]  Culture, blood (Routine X 2) w Reflex to ID Panel     Status: None (Preliminary result)   Collection Time: 09/01/24 10:09 AM   Specimen: BLOOD LEFT ARM  Result Value Ref Range   Specimen Description BLOOD LEFT ARM    Special Requests      BOTTLES  DRAWN AEROBIC AND ANAEROBIC Blood Culture adequate volume   Culture      NO GROWTH < 24 HOURS Performed at [facility], [address], [place], [place] [address]    Report Status PENDING   Culture, blood (Routine X 2) w Reflex to ID Panel     Status: None (Preliminary result)   Collection Time: 09/01/24 10:18 AM   Specimen: BLOOD RIGHT HAND  Result Value Ref Range   Specimen Description BLOOD RIGHT HAND    Special Requests  BOTTLES DRAWN AEROBIC AND ANAEROBIC Blood Culture results may not be optimal due to an inadequate volume of blood received in culture bottles   Culture      NO GROWTH < 24 HOURS Performed at [facility], [address], [place], [place] [address]    Report Status PENDING   CBC     Status: Abnormal   Collection Time: 09/02/24  4:06 AM  Result Value Ref Range   WBC 12.2 (H) 4.0 - 10.5 K/uL   RBC 4.97 3.87 - 5.11 MIL/uL   Hemoglobin 14.6 12.0 - 15.0 g/dL   HCT 57.4 63.9 - 53.9 %   MCV 85.5 80.0 - 100.0 fL   MCH 29.4 26.0 - 34.0 pg   MCHC 34.4 30.0 - 36.0 g/dL   RDW 88.1 88.4 - 84.4 %   Platelets 294 150 - 400 K/uL   nRBC 0.0 0.0 - 0.2 %    Comment: Performed at [facility], [address] [place], [place] [address]  Phosphorus     Status: None   Collection Time: 09/02/24  4:06 AM  Result Value Ref Range   Phosphorus 3.6 2.5 - 4.6 mg/dL    Comment: Performed at [facility], [address], [place], [place] [address]  Magnesium     Status: None   Collection Time: 09/02/24  4:06 AM  Result Value Ref Range   Magnesium 2.0 1.7 - 2.4 mg/dL    Comment: Performed at [facility], [address], [place], [place] [address]  Basic metabolic panel with GFR     Status: Abnormal   Collection Time: 09/02/24  4:06 AM  Result Value Ref Range   Sodium 133 (L) 135 - 145 mmol/L   Potassium 3.9 3.5 - 5.1 mmol/L   Chloride 98 98 - 111 mmol/L   CO2 23 22 - 32 mmol/L   Glucose, Bld 135 (H) 70 - 99 mg/dL    Comment: Glucose reference  range applies only to samples taken after fasting for at least 8 hours.   BUN 27 (H) 8 - 23 mg/dL   Creatinine, Ser 8.15 (H) 0.44 - 1.00 mg/dL   Calcium  9.0 8.9 - 10.3 mg/dL   GFR, Estimated 31 (L) >60 mL/min    Comment: (NOTE) Calculated using the CKD-EPI Creatinine Equation (2021)    Anion gap 12 5 - 15    Comment: Performed at [facility], [address], [place], [place] [address]   DG CHEST PORT 1 VIEW Result Date: 09/01/2024 EXAM: 1 VIEW XRAY OF THE CHEST 09/01/2024 10:03:48 AM COMPARISON: None available. CLINICAL HISTORY: Fever FINDINGS: LUNGS AND PLEURA: No focal pulmonary opacity. No pleural effusion. No pneumothorax. HEART AND MEDIASTINUM: No acute abnormality of the cardiac and mediastinal silhouettes. BONES AND SOFT TISSUES: No acute osseous abnormality. IMPRESSION: 1. No acute cardiopulmonary process identified. Electronically signed by: Waddell Calk MD 09/01/2024 01:37 PM EST RP Workstation: [id number]      Blood pressure 134/81, pulse 88, temperature 99.1 F (37.3 C), temperature source Oral, resp. rate 18, height 5' 3 (1.6 m), weight 63.6 kg, SpO2 97%.  Medical Problem List and Plan: 1. Functional deficits secondary to left caudate hemorrhage d/t hypertension  -patient may  shower  -ELOS/Goals: 7-10 days, supervision to mod I  2.  Antithrombotics: -DVT/anticoagulation:  Mechanical: Sequential compression devices, below knee Bilateral lower extremities  -antiplatelet therapy: n/a   -no meds d/t hemorrhage 3. Pain Management: Ongoing headaches now on oxycodone   and tylenol prn. ?Consider Topamax at bedtime   4. Mood/Behavior/Sleep: LCSW to follow for evaluation and support when available.   -antipsychotic agents: N/A  5. Neuropsych/cognition: This patient is capable of making decisions on her own behalf.  6. Skin/Wound Care: Routine pressure relief measures.   7. Fluids/Electrolytes/Nutrition: Monitor I&O and weight. Follow up labs CBC/CMP    -Encourage  oral hydration NA 133.    9. Fever: Tmax 100.3. Chest xray and urinalysis completed and are negative. Blood cultures with no growth.  Antibiotics have not been initiated. Patient currently asymptomatic.    -11/20: WBC 12.2, continue to monitor   10. Hx of Stroke/TIA: Was on aspirin regimen for many years but currently off. Continue holding antiplatelets.  11. Hypertension: Norvasc and Valsartan  at home, holding Valsartan .  Monitor BP per protocol.   12. Hyperlipidemia: LDL 164. Pitavastatin  at home, now on Atorvastatin 40 mg daily.   13. AKI on CKD IIIa: Baseline 1.67--- Cr 1.8 from 2.2, continue holding valsartan . Monitor labs  14. Constipation: LBM unknown.  Placed on Colace and Miralax with prn laxatives.      Rhonda LOISE Satterfield, NP 09/02/2024

## 2024-09-02 NOTE — Discharge Summary (Addendum)
 Physician Discharge Summary  Rhonda Cortez FMW:[id number] DOB: [birth year] DOA: 08/27/2024  PCP: Lucius Krabbe, NP  Admit date: 08/27/2024 Discharge date: 09/02/2024  Admitted From: Home  Discharge disposition: CIR   Recommendations for Outpatient Follow-Up:   Follow up with your primary care provider after discharge from rehab medicine Patient with low-grade fever.  Blood cultures chest x-ray negative.  UA collection pending.  Has been observed off antibiotic at this time.  Please review and decide if any further treatment is needed  Discharge Diagnosis:   Principal Problem:   Intraparenchymal hemorrhage of brain [facility])   Discharge Condition: Improved.  Diet recommendation:   Regular.  Wound care: None.  Code status: Full.   History of Present Illness:   Rhonda Cortez is a 63 y.o. female with hx of hypertension and previous stroke presented to hospital with confusion word finding difficulty. Initial vitals were notable for mild tachycardia. Patient had mild leukocytosis on presentation. Hemoglobin A1c was 5.8. Creatinine at 1.7. CT head scan done in the ED showed caudate head hemorrhage with intraventricular extension. Neurology was consulted and patient was admitted to the hospital for further evaluation and treatment.   Hospital Course:   Following conditions were addressed during hospitalization as listed below,  ICH:  Left caudate head intraparenchymal hemorrhage with IVH, secondary to hypertension.  CT head -acute intracranial hemorrhage with significant intraventricular extension from an acute bleed in the left caudate nucleus.  Mild asymmetric enlargement of the left lateral ventricle due to blood; temporal horns remain diminutive with trace rightward midline shift.  Neurology followed the patient during hospitalization.  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  2D echocardiogram with LV ejection fraction of 60 to 65%.  LDL was 164 with hemoglobin A1c of 5.8.   Urine drug screen negative.  At this time physical therapy has seen the patient and recommended CIR.  Continue Lipitor.   Ongoing headache.  Could be secondary to intracranial hemorrhage.  Patient was on Tylenol but was not much responsive so I added oxycodone.  Spoke with  neurology for further opinion and neurology did not recommend further testing but Tylenol and supportive care. patient denies any nausea vomiting or visual issues.  Denies history of migraine in the past.     New fever.  Uncertain etiology at this time.  Temperature max of 100.3 F in the last 24 hours.  Patient denies any urinary symptoms or breathing issues.  Blood cultures negative in less than 24 hours.  Patient has mild leukocytosis.  Chest x-ray without any infiltrate.  Urinalysis has been ordered, pending collection.  Patient however denies urinary symptoms.  Patient has not been started on antibiotics at this time.  Hx of Stroke/TIA Was on aspirin for few years but currently was off.  Will hold off with antiplatelets for now.   Hypertension  Patient is on Norvasc and valsartan  at home.  Valsartan  is still on hold.  Continue amlodipine.   Hyperlipidemia Patient was on pitavastatin  at home.  Currently on atorvastatin 40 mg.  LDL goal less than 70.  LDL in the hospital at 164.  Continue Lipitor.   AKI on CKD 3a Creatinine at 1.8 from 2.2.  Likely at baseline.  Valsartan  held.  Continue to hold valsartan  for now.  Constipation: Will add Colace and MiraLAX  Deconditioning debility.  Patient has been seen by physical therapy and recommend CIR at this time.  Disposition.  At this time, patient is stable for disposition to CIR.  Communicative with patient's fianc prior to discharge  Medical Consultants:   Neurology  Procedures:    None Subjective:   Today, patient was seen and examined at bedside.  Had low-grade fever and still complains of moderate headache.  No nausea vomiting visual blurriness focal weakness.   Denies shortness of breath dyspnea.  Denies urinary urgency frequency dysuria.  Complains of constipation.  Discharge Exam:   Vitals:   09/02/24 0352 09/02/24 0759  BP: (!) 143/78 138/85  Pulse: 89 89  Resp:    Temp: 98.6 F (37 C) 99.8 F (37.7 C)  SpO2: 95% 97%   Vitals:   09/01/24 1955 09/01/24 2355 09/02/24 0352 09/02/24 0759  BP: 133/84 139/81 (!) 143/78 138/85  Pulse: 99 94 89 89  Resp:      Temp: 99 F (37.2 C) 100.3 F (37.9 C) 98.6 F (37 C) 99.8 F (37.7 C)  TempSrc: Oral Oral Oral Oral  SpO2: 99% 97% 95% 97%  Weight:      Height:       Body mass index is 24.84 kg/m.  General: Alert awake, not in obvious distress HENT: pupils equally reacting to light,  No scleral pallor or icterus noted. Oral mucosa is moist.  Chest:  Clear breath sounds.  Diminished breath sounds bilaterally. No crackles or wheezes.  CVS: S1 &S2 heard. No murmur.  Regular rate and rhythm. Abdomen: Soft, nontender, nondistended.  Bowel sounds are heard.   Extremities: No cyanosis, clubbing or edema.  Peripheral pulses are palpable. Psych: Alert, awake and oriented, normal mood CNS:  No cranial nerve deficits.  Moves all extremities Skin: Warm and dry.  No rashes noted.  The results of significant diagnostics from this hospitalization (including imaging, microbiology, ancillary and laboratory) are listed below for reference.     Diagnostic Studies:   ECHOCARDIOGRAM COMPLETE Result Date: 08/28/2024    ECHOCARDIOGRAM REPORT   Patient Name:   Rhonda Cortez Date of Exam: 08/28/2024 Medical Rec #:  [id number]   Height:       63.0 in Accession #:    [id number]  Weight:       140.2 lb Date of Birth:  May 02, 1961   BSA:          1.663 m Patient Age:    63 years    BP:           132/84 mmHg Patient Gender: F           HR:           83 bpm. Exam Location:  Inpatient Procedure: 2D Echo (Both Spectral and Color Flow Doppler were utilized during            procedure). Indications:    stroke  History:         Patient has no prior history of Echocardiogram examinations.                 Risk Factors:Hypertension and Dyslipidemia.  Sonographer:    Tinnie Barefoot RDCS Referring Phys: [id number] MCNEILL P KIRKPATRICK IMPRESSIONS  1. Left ventricular ejection fraction, by estimation, is 60 to 65%. The left ventricle has normal function. The left ventricle has no regional wall motion abnormalities. Left ventricular diastolic parameters were normal.  2. Right ventricular systolic function is normal. The right ventricular size is normal. There is normal pulmonary artery systolic pressure. The estimated right ventricular systolic pressure is 31.1 mmHg.  3. The mitral valve is normal in structure. Trivial mitral valve regurgitation. No evidence  of mitral stenosis.  4. The aortic valve is tricuspid. Aortic valve regurgitation is not visualized. No aortic stenosis is present.  5. The inferior vena cava is normal in size with greater than 50% respiratory variability, suggesting right atrial pressure of 3 mmHg. FINDINGS  Left Ventricle: Left ventricular ejection fraction, by estimation, is 60 to 65%. The left ventricle has normal function. The left ventricle has no regional wall motion abnormalities. The left ventricular internal cavity size was normal in size. There is  no left ventricular hypertrophy. Left ventricular diastolic parameters were normal. Right Ventricle: The right ventricular size is normal. No increase in right ventricular wall thickness. Right ventricular systolic function is normal. There is normal pulmonary artery systolic pressure. The tricuspid regurgitant velocity is 2.65 m/s, and  with an assumed right atrial pressure of 3 mmHg, the estimated right ventricular systolic pressure is 31.1 mmHg. Left Atrium: Left atrial size was normal in size. Right Atrium: Right atrial size was normal in size. Pericardium: There is no evidence of pericardial effusion. Mitral Valve: The mitral valve is normal in structure. Trivial  mitral valve regurgitation. No evidence of mitral valve stenosis. Tricuspid Valve: The tricuspid valve is normal in structure. Tricuspid valve regurgitation is trivial. Aortic Valve: The aortic valve is tricuspid. Aortic valve regurgitation is not visualized. No aortic stenosis is present. Pulmonic Valve: The pulmonic valve was not well visualized. Pulmonic valve regurgitation is trivial. Aorta: The aortic root is normal in size and structure. Venous: The inferior vena cava is normal in size with greater than 50% respiratory variability, suggesting right atrial pressure of 3 mmHg. IAS/Shunts: The interatrial septum was not well visualized.  LEFT VENTRICLE PLAX 2D LVIDd:         3.80 cm   Diastology LVIDs:         2.60 cm   LV e' medial:    10.20 cm/s LV PW:         0.90 cm   LV E/e' medial:  10.5 LV IVS:        0.80 cm   LV e' lateral:   9.14 cm/s LVOT diam:     1.80 cm   LV E/e' lateral: 11.7 LV SV:         53 LV SV Index:   32 LVOT Area:     2.54 cm LV IVRT:       67 msec  RIGHT VENTRICLE             IVC RV Basal diam:  2.40 cm     IVC diam: 1.80 cm RV S prime:     12.60 cm/s TAPSE (M-mode): 1.6 cm LEFT ATRIUM             Index        RIGHT ATRIUM          Index LA diam:        2.70 cm 1.62 cm/m   RA Area:     9.32 cm LA Vol (A2C):   27.6 ml 16.60 ml/m  RA Volume:   19.60 ml 11.79 ml/m LA Vol (A4C):   28.9 ml 17.38 ml/m LA Biplane Vol: 29.0 ml 17.44 ml/m  AORTIC VALVE LVOT Vmax:   109.00 cm/s LVOT Vmean:  69.700 cm/s LVOT VTI:    0.210 m  AORTA Ao Root diam: 2.40 cm MITRAL VALVE                TRICUSPID VALVE MV Area (PHT): 5.02 cm  TR Peak grad:   28.1 mmHg MV Decel Time: 151 msec     TR Vmax:        265.00 cm/s MV E velocity: 107.00 cm/s MV A velocity: 109.00 cm/s  SHUNTS MV E/A ratio:  0.98         Systemic VTI:  0.21 m                             Systemic Diam: 1.80 cm Lonni Nanas MD Electronically signed by Lonni Nanas MD Signature Date/Time: 08/28/2024/2:47:28 PM    Final    CT  HEAD WO CONTRAST ( ) Result Date: 08/28/2024 EXAM: CT HEAD WITHOUT CONTRAST 08/28/2024 05:43:00 AM TECHNIQUE: CT of the head was performed without the administration of intravenous contrast. Automated exposure control, iterative reconstruction, and/or weight based adjustment of the mA/kV was utilized to reduce the radiation dose to as low as reasonably achievable. COMPARISON: 08/27/2024 CLINICAL HISTORY: Stroke, hemorrhagic FINDINGS: BRAIN AND VENTRICLES: The left caudate intraparenchymal hemorrhage noted on the previous study appears unchanged in the interim. There is also no significant interval change in the volume or appearance of intraventricular hemorrhage, which is primarily situated within the left lateral ventricle but is also again demonstrated within the third and fourth ventricles and dependently within the posterior horn of the right lateral ventricle. There is moderate periventricular and deep cerebral white matter disease. The encephalomalacia changes described within the right anterior frontal lobe on the prior study is not appreciated on the current exam and was likely superior artifact caused by prominent calcification within the falx. There is no adverse interval change. No evidence of acute infarct. No hydrocephalus. No extra-axial collection. No mass effect or midline shift. ORBITS: No acute abnormality. SINUSES: No acute abnormality. SOFT TISSUES AND SKULL: No acute soft tissue abnormality. No skull fracture. IMPRESSION: 1. Stable left caudate intraparenchymal hemorrhage and intraventricular hemorrhage. 2. Moderate periventricular and deep cerebral white matter disease. 3. No adverse interval change. Electronically signed by: Evalene Coho MD 08/28/2024 06:17 AM EST RP Workstation: [id number]   CT HEAD WO CONTRAST Result Date: 08/27/2024 EXAM: CT HEAD WITHOUT study_datetime TECHNIQUE: CT of the head was performed without the administration of intravenous contrast. Automated exposure  control, iterative reconstruction, and/or weight based adjustment of the mA/kV was utilized to reduce the radiation dose to as low as reasonably achievable. COMPARISON: None available. CLINICAL HISTORY: 63 year old female was missing overnight, memory loss. FINDINGS: BRAIN AND VENTRICLES: Moderate to large volume of hyperdense blood in the left lateral ventricle, the 3rd and 4th ventricles. There is evidence of associated biconvex intra-axial hemorrhage of the left caudate nucleus on series 2 image 15. Estimated caudate intra-axial blood products measure 18 x 10 x 17 mm (AP x transverse x CC), with an estimated volume of 2 mL. Probable tubular hypodense thrombus is present within the left lateral ventricle blood products. There is a trace rightward midline shift (2 to 3 mm) in part related to mild asymmetric enlargement of the left lateral ventricle. Temporal horns are diminutive. No transependymal edema is evident. Basilar cisterns remain patent. There is no convincing subarachnoid hemorrhage. Patchy and confluent bilateral cerebral white matter hypodensity in both hemispheres is advanced for age. There is evidence of small volume cortical encephalomalacia in the anterior right frontal lobe medially on series 2 image 17. Heterogeneous hypodensity is also noted in the bilateral thalami. Posterior fossa gray white differentiation is within normal limits. Calcified atherosclerosis is present at  the skull base. Asymmetric right basal ganglia vascular calcifications are noted. No suspicious intracranial vascular hyperdensity. ORBITS: No acute abnormality. SINUSES AND MASTOIDS: Visible paranasal sinuses, middle ears and mastoids are well aerated. SOFT TISSUES AND SKULL: No acute skull fracture. No acute orbital scalp soft tissue finding. IMPRESSION: 1. Acute intracranial hemorrhage appears to be significant intraventricular extension from an acute bleed in the left caudate nucleus (estimated 2 mL). 2. Mild asymmetric  enlargement of the left lateral due to blood ;temporal horns remain diminutive. Trace rightward midline shift (23 mm). 3. Evidence of underlying age advanced chronic small vessel disease. 4. Study discussed by telephone with Dr. Pamella at the time of this report. Electronically signed by: Helayne Hurst MD 08/27/2024 01:44 PM EST RP Workstation: [id number]     Labs:   Basic Metabolic Panel: Recent Labs  Lab 08/29/24 0535 08/30/24 0144 08/31/24 0148 09/01/24 0440 09/02/24 0406  NA 144 138 138 138 133*  K 4.4 3.7 3.7 3.9 3.9  CL 106 103 103 99 98  CO2 25 22 22 23 23   GLUCOSE 116* 183* 126* 126* 135*  BUN 28* 26* 25* 24* 27*  CREATININE 2.19* 1.95* 1.81* 2.20* 1.84*  CALCIUM  9.4 8.8* 9.0 9.4 9.0  MG  --   --   --   --  2.0  PHOS  --   --   --   --  3.6   GFR Estimated Creatinine Clearance: 28.5 mL/min (A) (by C-G formula based on SCr of 1.84 mg/dL (H)). Liver Function Tests: Recent Labs  Lab 08/27/24 1222  AST 24  ALT 17  ALKPHOS 69  BILITOT 0.8  PROT 8.2*  ALBUMIN 4.5   No results for input(s): LIPASE, AMYLASE in the last 168 hours. No results for input(s): AMMONIA in the last 168 hours. Coagulation profile Recent Labs  Lab 08/27/24 1222  INR 1.0    CBC: Recent Labs  Lab 08/27/24 1222 08/27/24 1226 08/29/24 0535 08/30/24 0144 08/31/24 0148 09/01/24 0440 09/02/24 0406  WBC 10.4   < > 12.2* 12.2* 11.5* 11.9* 12.2*  NEUTROABS 8.2*  --   --   --   --   --   --   HGB 15.4*   < > 14.8 14.3 15.3* 15.2* 14.6  HCT 45.0   < > 44.6 43.2 44.6 44.2 42.5  MCV 86.9   < > 89.6 88.2 86.4 86.2 85.5  PLT 369   < > 342 326 349 311 294   < > = values in this interval not displayed.   Cardiac Enzymes: No results for input(s): CKTOTAL, CKMB, CKMBINDEX, TROPONINI in the last 168 hours. BNP: Invalid input(s): POCBNP CBG: Recent Labs  Lab 08/27/24 1240  GLUCAP 135*   D-Dimer No results for input(s): DDIMER in the last 72 hours. Hgb A1c No results for  input(s): HGBA1C in the last 72 hours. Lipid Profile No results for input(s): CHOL, HDL, LDLCALC, TRIG, CHOLHDL, LDLDIRECT in the last 72 hours. Thyroid function studies No results for input(s): TSH, T4TOTAL, T3FREE, THYROIDAB in the last 72 hours.  Invalid input(s): FREET3 Anemia work up No results for input(s): VITAMINB12, FOLATE, FERRITIN, TIBC, IRON, RETICCTPCT in the last 72 hours. Microbiology Recent Results (from the past 240 hours)  MRSA Next Gen by PCR, Nasal     Status: None   Collection Time: 08/27/24  8:48 PM   Specimen: Nasal Mucosa; Nasal Swab  Result Value Ref Range Status   MRSA by PCR Next Gen NOT DETECTED NOT DETECTED Final  Comment: (NOTE) The GeneXpert MRSA Assay (FDA approved for NASAL specimens only), is one component of a comprehensive MRSA colonization surveillance program. It is not intended to diagnose MRSA infection nor to guide or monitor treatment for MRSA infections. Test performance is not FDA approved in patients less than 52 years old. Performed at [facility], [address] [place], [place] [address]   Culture, blood (Routine X 2) w Reflex to ID Panel     Status: None (Preliminary result)   Collection Time: 09/01/24 10:09 AM   Specimen: BLOOD LEFT ARM  Result Value Ref Range Status   Specimen Description BLOOD LEFT ARM  Final   Special Requests   Final    BOTTLES DRAWN AEROBIC AND ANAEROBIC Blood Culture adequate volume   Culture   Final    NO GROWTH < 24 HOURS Performed at [facility], [address], [place], [place] [address]    Report Status PENDING  Incomplete  Culture, blood (Routine X 2) w Reflex to ID Panel     Status: None (Preliminary result)   Collection Time: 09/01/24 10:18 AM   Specimen: BLOOD RIGHT HAND  Result Value Ref Range Status   Specimen Description BLOOD RIGHT HAND  Final   Special Requests   Final    BOTTLES DRAWN AEROBIC AND ANAEROBIC Blood Culture results may not  be optimal due to an inadequate volume of blood received in culture bottles   Culture   Final    NO GROWTH < 24 HOURS Performed at [facility], [address], [place], [place] [address]    Report Status PENDING  Incomplete     Discharge Instructions:   Discharge Instructions     Call MD for:  persistant nausea and vomiting   Complete by: As directed    Call MD for:  severe uncontrolled pain   Complete by: As directed    Call MD for:  temperature >100.4   Complete by: As directed    Diet general   Complete by: As directed    Discharge instructions   Complete by: As directed    Continue rehabilitation.   Increase activity slowly   Complete by: As directed       Allergies as of 09/02/2024   No Known Allergies      Medication List     PAUSE taking these medications    valsartan  80 MG tablet Wait to take this until your doctor or other care provider tells you to start again. Commonly known as: DIOVAN  Take 1 tablet (80 mg total) by mouth daily. What changed: when to take this       STOP taking these medications    Pitavastatin  Calcium  1 MG Tabs       TAKE these medications    acetaminophen 325 MG tablet Commonly known as: TYLENOL Take 2 tablets (650 mg total) by mouth every 4 (four) hours as needed for mild pain (pain score 1-3), fever or headache (or temp > 37.5 C (99.5 F)).   amLODipine 10 MG tablet Commonly known as: NORVASC Take 10 mg by mouth daily.   atorvastatin 40 MG tablet Commonly known as: LIPITOR Take 1 tablet (40 mg total) by mouth daily. Start taking on: September 03, 2024   oxyCODONE 5 MG immediate release tablet Commonly known as: Oxy IR/ROXICODONE Take 1 tablet (5 mg total) by mouth every 6 (six) hours as needed for moderate pain (pain score 4-6) or severe pain (pain score 7-10).  pantoprazole 40 MG tablet Commonly known as: PROTONIX Take 1 tablet (40 mg total) by mouth at bedtime.   polyethylene glycol 17 g  packet Commonly known as: MiraLax Take 17 g by mouth daily as needed for moderate constipation or severe constipation.   senna-docusate 8.6-50 MG tablet Commonly known as: Senokot-S Take 1 tablet by mouth 2 (two) times daily.   triamcinolone  ointment 0.1 % Commonly known as: KENALOG  Apply 1 Application topically 2 (two) times daily as needed (skin irritation).   VITAMIN D-3 PO Take 1 capsule by mouth daily.        Follow-up Information     Lucius Krabbe, NP Follow up in 1 week(s).   Specialty: Family Medicine Contact information: [address] [place] [address] [phone]                  Time coordinating discharge: 39 minutes  Signed:  Mostafa Yuan  [facility] 09/02/2024, 10:34 AM

## 2024-09-02 NOTE — TOC Transition Note (Signed)
 Transition of Care [facility]) - Discharge Note   Patient Details  Name: Rhonda Cortez MRN: [id number] Date of Birth: 1960/11/09  Transition of Care [facility]) CM/SW Contact:  Landry DELENA Senters, RN Phone Number: 09/02/2024, 10:31 AM   Clinical Narrative:     Patient discharging to CIR today.  No further needs identified by CM.  Final next level of care: IP Rehab Facility Barriers to Discharge: No Barriers Identified   Patient Goals and CMS Choice   CMS Medicare.gov Compare Post Acute Care list provided to:: Patient Choice offered to / list presented to : Patient      Discharge Placement                       Discharge Plan and Services Additional resources added to the After Visit Summary for                                       Social Drivers of Health (SDOH) Interventions SDOH Screenings   Food Insecurity: No Food Insecurity (08/30/2024)  Housing: Low Risk  (08/30/2024)  Transportation Needs: No Transportation Needs (08/30/2024)  Utilities: Not At Risk (08/30/2024)  Depression (PHQ2-9): Low Risk  (02/17/2024)  Financial Resource Strain: Low Risk  (02/17/2024)  Physical Activity: Insufficiently Active (02/17/2024)  Social Connections: Socially Isolated (02/17/2024)  Stress: No Stress Concern Present (02/17/2024)  Tobacco Use: Low Risk  (08/27/2024)     Readmission Risk Interventions     No data to display

## 2024-09-02 NOTE — Progress Notes (Signed)
 Inpatient Rehabilitation  Patient information reviewed and entered into eRehab system by [facility]. Karen Kays., CCC/SLP, PPS Coordinator.  Information including medical coding, functional ability and quality indicators will be reviewed and updated through discharge.

## 2024-09-02 NOTE — Progress Notes (Signed)
 Inpatient Rehab Admissions Coordinator:    I have insurance approval and a bed available for pt to admit to CIR today. Dr. Sonjia in agreement and [facility] aware.  I will notify pt/family and make arrangements.    Reche Lowers, PT, DPT Admissions Coordinator [phone] 09/02/24 10:21 AM

## 2024-09-02 NOTE — Progress Notes (Signed)
 Physical Therapy Treatment Patient Details Name: Rhonda Cortez MRN: [id number] DOB: 06-19-1961 Today's Date: 09/02/2024   History of Present Illness Pt is a 63 y.o. female presenting 11/14 with difficulty word finding and confusion. CTH 11/14 with acute intracranial hemorrhage in the left caudate nucleus with intraventricular extension; trace rightward midline shift. CTH 11/15 with bleed stable. PMH: HTN, CVA    PT Comments  Pt received in supine and agreeable to session with encouragement. Pt continues to be limited by headache and dizziness with mobility. Pt able to complete gait trial without AD and HHA with up to min A for balance. Pt able to brush her teeth at the sink in standing with CGA for safety. Pt focused on showering during session, RN notified. Pt declines further mobility due to fatigue. Pt continues to benefit from PT services to progress toward functional mobility goals.     If plan is discharge home, recommend the following: A little help with walking and/or transfers;A little help with bathing/dressing/bathroom;Assistance with cooking/housework;Direct supervision/assist for medications management;Direct supervision/assist for financial management;Assist for transportation;Supervision due to cognitive status;Help with stairs or ramp for entrance   Can travel by private vehicle        Equipment Recommendations  Rolling walker (2 wheels);BSC/3in1    Recommendations for Other Services       Precautions / Restrictions Precautions Precautions: Fall;Other (comment) Recall of Precautions/Restrictions: Impaired Precaution/Restrictions Comments: SBP < 160 Restrictions Weight Bearing Restrictions Per Provider Order: No     Mobility  Bed Mobility Overal bed mobility: Needs Assistance Bed Mobility: Supine to Sit, Sit to Supine     Supine to sit: Contact guard, Used rails Sit to supine: Supervision   General bed mobility comments: increased time    Transfers Overall  transfer level: Needs assistance Equipment used: None Transfers: Sit to/from Stand Sit to Stand: Contact guard assist                Ambulation/Gait Ambulation/Gait assistance: Contact guard assist, Min assist Gait Distance (Feet): 36 Feet Assistive device: None, 1 person hand held assist Gait Pattern/deviations: Step-through pattern, Decreased stride length Gait velocity: reduced     General Gait Details: Slow, unsteady gait without AD and pt intermittently reaching out to environment for support so HHA provided.   Stairs             Wheelchair Mobility     Tilt Bed    Modified Rankin (Stroke Patients Only) Modified Rankin (Stroke Patients Only) Pre-Morbid Rankin Score: No symptoms Modified Rankin: Moderately severe disability     Balance Overall balance assessment: Needs assistance Sitting-balance support: No upper extremity supported, Feet supported Sitting balance-Leahy Scale: Fair Sitting balance - Comments: sitting EOB   Standing balance support: Single extremity supported, Bilateral upper extremity supported, During functional activity Standing balance-Leahy Scale: Fair Standing balance comment: unsteadiness, but no overt LOB without AD                            Communication Communication Communication: Impaired Factors Affecting Communication: Reduced clarity of speech;Difficulty expressing self  Cognition Arousal: Alert Behavior During Therapy: WFL for tasks assessed/performed   PT - Cognitive impairments: Problem solving, Attention, Awareness                       PT - Cognition Comments: Pt perseverating on showering and requires increased encouragement to participate Following commands: Impaired Following commands impaired: Follows multi-step commands with increased  time    Cueing Cueing Techniques: Verbal cues  Exercises      General Comments General comments (skin integrity, edema, etc.): Pt reports slight  dizziness during mobility      Pertinent Vitals/Pain Pain Assessment Pain Assessment: Faces Faces Pain Scale: Hurts even more Pain Location: Head Pain Descriptors / Indicators: Headache Pain Intervention(s): Limited activity within patient's tolerance, Monitored during session     PT Goals (current goals can now be found in the care plan section) Acute Rehab PT Goals Patient Stated Goal: to be independent PT Goal Formulation: With patient/family Time For Goal Achievement: 09/12/24 Progress towards PT goals: Progressing toward goals    Frequency    Min 3X/week       AM-PAC PT 6 Clicks Mobility   Outcome Measure  Help needed turning from your back to your side while in a flat bed without using bedrails?: A Little Help needed moving from lying on your back to sitting on the side of a flat bed without using bedrails?: A Little Help needed moving to and from a bed to a chair (including a wheelchair)?: A Little Help needed standing up from a chair using your arms (e.g., wheelchair or bedside chair)?: A Little Help needed to walk in hospital room?: A Little Help needed climbing 3-5 steps with a railing? : A Lot 6 Click Score: 17    End of Session Equipment Utilized During Treatment: Gait belt Activity Tolerance: Patient tolerated treatment well;Patient limited by fatigue Patient left: with call bell/phone within reach;in bed;with nursing/sitter in room;with bed alarm set Nurse Communication: Mobility status PT Visit Diagnosis: Unsteadiness on feet (R26.81);Other abnormalities of gait and mobility (R26.89);Difficulty in walking, not elsewhere classified (R26.2);Other symptoms and signs involving the nervous system (R29.898)     Time: 9167-9151 PT Time Calculation (min) (ACUTE ONLY): 16 min  Charges:    $Gait Training: 8-22 mins PT General Charges $$ ACUTE PT VISIT: 1 Visit                    Darryle George, PTA Acute Rehabilitation Services Secure Chat Preferred   [phone]    Darryle George 09/02/2024, 10:57 AM

## 2024-09-02 NOTE — Plan of Care (Signed)

## 2024-09-02 NOTE — Plan of Care (Signed)
   Problem: Education: Goal: Knowledge of disease or condition will improve Outcome: Progressing

## 2024-09-02 NOTE — Progress Notes (Signed)
 Babs Arthea DASEN, MD  Physician Physical Medicine and Rehabilitation   Consult Note    Signed   Date of Service: 08/30/2024  1:44 PM  Related encounter: ED to Hosp-Admission (Discharged) from 08/27/2024 [place] [facility]   Signed     Expand All Collapse All           Physical Medicine and Rehabilitation Consult Reason for Consult: Deficits in functional mobility as well as confusion Referring Physician: Rosemarie     HPI: Rhonda Cortez is a 63 y.o. female with a history of CKD, prior stroke in 2014, and hypertension who presented on 08/27/2024 with confusion.  CT of the head revealed a acute intracranial hemorrhage without significant intraventricular extension in the left caudate nucleus.  MRI ultimately revealed a focal area of intraparenchymal hemorrhage within the left caudate measuring 1.7 x 1.1 x 1.9 cm consistent with prior CT along with intraventricular hemorrhage primarily within the left lateral ventricle and within the fourth ventricle.  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  Mild to moderate focal stenosis of the mid basilar artery.  No antithrombotics due to hemorrhage.  Other active medical issues include hypertension, CKD 3A.  Therapy mobilized the patient yesterday and she was min assist for sit to stand transfers and min to mod assist with gait using a rolling walker for 110 feet.  Patient lives alone in a 1 level house with 2 steps to enter.  She has some family who can assist.  Patient was independent and driving prior to this admission.       Home: Home Living Family/patient expects to be discharged to:: Inpatient rehab Living Arrangements: Alone Available Help at Discharge: Family, Available 24 hours/day Type of Home: House Home Access: Stairs to enter [employer] of Steps: 2 Entrance Stairs-Rails: None Home Layout: One level Bathroom Shower/Tub: Tub/shower unit,  Health Visitor: Standard Home Equipment: Information systems manager, Grab bars - tub/shower, Hand held shower head  Lives With: Alone  Functional History: Prior Function Prior Level of Function : Independent/Modified Independent, Driving, Working/employed Mobility Comments: No AD ADLs Comments: 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 Independent in ADL, IADL, working, driving Functional Status:  Mobility: Bed Mobility Overal bed mobility: Needs Assistance Bed Mobility: Sit to Supine Sit to supine: Supervision General bed mobility comments: Supervision for safety returning to supine from sitting EOB Transfers Overall transfer level: Needs assistance Equipment used: None Transfers: Sit to/from Stand Sit to Stand: Min assist General transfer comment: Pt leaning her legs posteriorly against wheelchair for support with sit to stand, minA for balance and cues to shift weight anteriorly Ambulation/Gait Ambulation/Gait assistance: Min assist, Mod assist, Contact guard assist Gait Distance (Feet): 110 Feet Assistive device: None, Rolling walker (2 wheels) Gait Pattern/deviations: Step-through pattern, Decreased dorsiflexion - right, Decreased stride length, Staggering right, Drifts right/left, Narrow base of support, Decreased step length - right, Decreased step length - left General Gait Details: Pt ambulates with slow, small, narrow bil steps with slightly decreased R dorsiflexion during swing phase. Pt has a tendency to drift and stagger to the R with and without the RW. Verbal and tactile cues provided to widen stance. Pt also would sway and stagger posteriorly intermittently. Min-modA to ambulate ~6 ft without UE support. CGA-minA to ambulate with RW the remaining distance. Gait velocity: reduced Gait velocity interpretation: <1.31 ft/sec, indicative of household ambulator   ADL: ADL Overall ADL's :  Needs assistance/impaired Eating/Feeding: Set up, Sitting Grooming: Minimal assistance,  Standing Upper Body Bathing: Set up, Sitting Lower Body Bathing: Minimal assistance, Sitting/lateral leans, Sit to/from stand Upper Body Dressing : Set up, Sitting Lower Body Dressing: Minimal assistance, Sitting/lateral leans, Sit to/from stand Toilet Transfer: Minimal assistance, Ambulation, Stand-pivot, BSC/3in1 Functional mobility during ADLs: Minimal assistance   Cognition: Cognition Overall Cognitive Status: Impaired/Different from baseline Arousal/Alertness: Awake/alert Orientation Level: Oriented X4 Year: Other (Comment) (94 or 95) Month: November Day of Week: Correct Attention: Selective Selective Attention: Impaired Selective Attention Impairment: Verbal complex Memory: Impaired Memory Impairment: Storage deficit, Decreased recall of new information Awareness: Impaired Awareness Impairment: Intellectual impairment Problem Solving: Impaired Problem Solving Impairment: Verbal basic Executive Function: Initiating Initiating: Impaired Initiating Impairment: Verbal basic, Functional basic Cognition Arousal: Alert Behavior During Therapy: WFL for tasks assessed/performed Overall Cognitive Status: Impaired/Different from baseline     Review of Systems  Unable to perform ROS: Mental acuity       Past Medical History:  Diagnosis Date   Hypertension     Stroke [facility])               Past Surgical History:  Procedure Laterality Date   OOPHORECTOMY       TUBAL LIGATION                 Family History  Problem Relation Age of Onset   Diabetes Mother     Kidney disease Father     Hypertension Father     Hypertension Sister     Hypertension Brother     Asthma Son          Social History:  reports that she has never smoked. She has never used smokeless tobacco. She reports that she does not drink alcohol and does not use drugs. Allergies:  Allergies  No Known Allergies         Medications Prior to Admission  Medication Sig Dispense Refill   amLODipine  (NORVASC) 10 MG tablet Take 10 mg by mouth daily.       Cholecalciferol (VITAMIN D-3 PO) Take 1 capsule by mouth daily.       triamcinolone  ointment (KENALOG ) 0.1 % Apply 1 Application topically 2 (two) times daily as needed (skin irritation).       valsartan  (DIOVAN ) 80 MG tablet Take 1 tablet (80 mg total) by mouth daily. (Patient taking differently: Take 80 mg by mouth at bedtime.) 30 tablet 2   Pitavastatin  Calcium  1 MG TABS Take 1 tablet (1 mg total) by mouth daily. START with 1 pill Monday, Wednesday, Friday for 2 weeks, then increase to every other day for 2 weeks, then take daily. (Patient not taking: Reported on 08/28/2024) 90 tablet 0            Blood pressure 124/79, pulse 92, temperature 98.7 F (37.1 C), temperature source Oral, resp. rate 18, height 5' 3 (1.6 m), weight 63.6 kg, SpO2 99%. Physical Exam Constitutional:      General: She is not in acute distress.    Comments: Appears fatigued  HENT:     Head: Normocephalic.     Right Ear: External ear normal.     Left Ear: External ear normal.     Nose: Nose normal.     Mouth/Throat:     Mouth: Mucous membranes are moist.  Cardiovascular:     Rate and Rhythm: Normal rate.  Pulmonary:     Effort: Pulmonary effort is normal.  Abdominal:  Palpations: Abdomen is soft.  Musculoskeletal:        General: No swelling or tenderness.     Cervical back: Normal range of motion.  Skin:    General: Skin is warm.  Neurological:     Comments: Pt was slow to arouse. Oriented to person, place, stroke. Speech is low volume fairly clear. Pt with occasional word finding deficits, processing delays for simple tasks. CN exam is non-focal. Sensed pain in all 4 limbs. Difficult to perform MMT due to level of arousal this afternoon. No abnl tone witness. No limb ataxia   Psychiatric:     Comments: Flat and disengaged       Lab Results Last 24 Hours       Results for orders placed or performed during the hospital encounter of  08/27/24 (from the past 24 hours)  Basic metabolic panel with GFR     Status: Abnormal    Collection Time: 08/30/24  1:44 AM  Result Value Ref Range    Sodium 138 135 - 145 mmol/L    Potassium 3.7 3.5 - 5.1 mmol/L    Chloride 103 98 - 111 mmol/L    CO2 22 22 - 32 mmol/L    Glucose, Bld 183 (H) 70 - 99 mg/dL    BUN 26 (H) 8 - 23 mg/dL    Creatinine, Ser 8.04 (H) 0.44 - 1.00 mg/dL    Calcium  8.8 (L) 8.9 - 10.3 mg/dL    GFR, Estimated 29 (L) >60 mL/min    Anion gap 13 5 - 15  CBC     Status: Abnormal    Collection Time: 08/30/24  1:44 AM  Result Value Ref Range    WBC 12.2 (H) 4.0 - 10.5 K/uL    RBC 4.90 3.87 - 5.11 MIL/uL    Hemoglobin 14.3 12.0 - 15.0 g/dL    HCT 56.7 63.9 - 53.9 %    MCV 88.2 80.0 - 100.0 fL    MCH 29.2 26.0 - 34.0 pg    MCHC 33.1 30.0 - 36.0 g/dL    RDW 87.4 88.4 - 84.4 %    Platelets 326 150 - 400 K/uL    nRBC 0.0 0.0 - 0.2 %       Imaging Results (Last 48 hours)  VAS US  CAROTID Result Date: 08/30/2024 Carotid Arterial Duplex Study Patient Name:  ILINA XU  Date of Exam:   08/30/2024 Medical Rec #: [id number]    Accession #:    [id number] Date of Birth: January 24, 1961    Patient Gender: F Patient Age:   46 years Exam Location:  [facility] Procedure:      VAS US  CAROTID Referring Phys: ARY XU --------------------------------------------------------------------------------  Indications:       CVA. Risk Factors:      Hypertension, hyperlipidemia. Comparison Study:  No prior studies. Performing Technologist: Cordella Collet RVT  Examination Guidelines: A complete evaluation includes B-mode imaging, spectral Doppler, color Doppler, and power Doppler as needed of all accessible portions of each vessel. Bilateral testing is considered an integral part of a complete examination. Limited examinations for reoccurring indications may be performed as noted.  Right Carotid Findings: +----------+--------+--------+--------+-----------------------+--------+            PSV cm/sEDV cm/sStenosisPlaque Description     Comments +----------+--------+--------+--------+-----------------------+--------+ CCA Prox  73      12              smooth and heterogenous         +----------+--------+--------+--------+-----------------------+--------+  CCA Distal66      20              smooth and heterogenous         +----------+--------+--------+--------+-----------------------+--------+ ICA Prox  43      13                                              +----------+--------+--------+--------+-----------------------+--------+ ICA Mid   40      16                                              +----------+--------+--------+--------+-----------------------+--------+ ICA Distal55      21                                     tortuous +----------+--------+--------+--------+-----------------------+--------+ ECA       79      12                                              +----------+--------+--------+--------+-----------------------+--------+ +----------+--------+-------+--------+-------------------+           PSV cm/sEDV cmsDescribeArm Pressure (mmHG) +----------+--------+-------+--------+-------------------+ [id number]                                         +----------+--------+-------+--------+-------------------+ +---------+--------+--+--------+-+---------+ VertebralPSV cm/s24EDV cm/s6Antegrade +---------+--------+--+--------+-+---------+  Left Carotid Findings: +----------+--------+--------+--------+-----------------------+--------+           PSV cm/sEDV cm/sStenosisPlaque Description     Comments +----------+--------+--------+--------+-----------------------+--------+ CCA Prox  86      21              smooth and heterogenous         +----------+--------+--------+--------+-----------------------+--------+ CCA Distal58      17              smooth and heterogenous          +----------+--------+--------+--------+-----------------------+--------+ ICA Prox  28      13                                              +----------+--------+--------+--------+-----------------------+--------+ ICA Mid   55      26                                              +----------+--------+--------+--------+-----------------------+--------+ ICA Distal38      19                                     tortuous +----------+--------+--------+--------+-----------------------+--------+ ECA       67      12                                              +----------+--------+--------+--------+-----------------------+--------+ +----------+--------+--------+--------+-------------------+  PSV cm/sEDV cm/sDescribeArm Pressure (mmHG) +----------+--------+--------+--------+-------------------+ Subclavian118                                         +----------+--------+--------+--------+-------------------+ +---------+--------+--+--------+-+---------+ VertebralPSV cm/s24EDV cm/s7Antegrade +---------+--------+--+--------+-+---------+   Summary: Right Carotid: Velocities in the right ICA are consistent with a 1-39% stenosis. Left Carotid: Velocities in the left ICA are consistent with a 1-39% stenosis. Vertebrals: Bilateral vertebral arteries demonstrate antegrade flow. *See table(s) above for measurements and observations.  Electronically signed by Eather Popp MD on 08/30/2024 at 1:27:18 PM.    Final     MR ANGIO HEAD WO CONTRAST Result Date: 08/29/2024 EXAM: MR Angiography Head without intravenous Contrast. 08/29/2024 11:54:00 AM TECHNIQUE: Magnetic resonance angiography images of the head without intravenous contrast. Multiplanar 2D and 3D reformatted images are provided for review. COMPARISON: None provided. CLINICAL HISTORY: Stroke, hemorrhagic. FINDINGS: ANTERIOR CIRCULATION: There is mild stenosis of the ophthalmic segments of the internal carotid arteries  bilaterally. No significant stenosis of the anterior cerebral arteries. 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 There is also severe stenosis of the proximal M2 branches of the left middle cerebral artery. No aneurysm. POSTERIOR CIRCULATION: No significant stenosis of the posterior cerebral arteries. There is mild-to-moderate focal stenosis of the mid basilar artery, approximately 30 to 40%. No significant stenosis of the vertebral arteries. No aneurysm. IMPRESSION: 1. 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 2. Mild stenosis of the ophthalmic segments of the internal carotid arteries bilaterally. 3. Mild-to-moderate focal stenosis of the mid basilar artery, approximately 30 to 40%. Electronically signed by: Evalene Coho MD 08/29/2024 12:11 PM EST RP Workstation: [id number]    MR BRAIN WO CONTRAST Result Date: 08/29/2024 EXAM: MRI BRAIN WITHOUT CONTRAST 08/29/2024 11:54:00 AM TECHNIQUE: Multiplanar multisequence MRI of the head/brain was performed without the administration of intravenous contrast. COMPARISON: CT of the head dated 08/28/2024. CLINICAL HISTORY: Stroke, hemorrhagic. FINDINGS: BRAIN AND VENTRICLES: No acute infarct. Focal area of intraparenchymal hemorrhage again demonstrated within the left caudate measuring approximately 1.7 x 1.1 x 1.9 cm. Intraventricular hemorrhage again demonstrated primarily within the left lateral ventricle, but also layering independently within the posterior horn of the right lateral ventricle and within the fourth ventricle. No evidence of underlying mass. No midline shift. No hydrocephalus. Extensive cerebral white matter disease. Small focus of hemosiderin staining present within each frontal lobe and within the left anteromedial cerebellar hemisphere. The sella is unremarkable. Normal flow voids. ORBITS: No acute abnormality. SINUSES AND  MASTOIDS: No acute abnormality. BONES AND SOFT TISSUES: Normal marrow signal. No acute soft tissue abnormality. IMPRESSION: 1. Focal area of intraparenchymal hemorrhage within the left caudate measuring approximately 1.7 x 1.1 x 1.9 cm, as seen on prior CT. 2. Intraventricular hemorrhage, primarily within the left lateral ventricle, but also layering independently within the posterior horn of the right lateral ventricle and within the fourth ventricle, as seen on prior CT. 3. No evidence of underlying mass. 4. Extensive cerebral white matter disease. 5. Small focus of hemosiderin staining present within each frontal lobe and within the left anteromedial cerebellar hemisphere. Electronically signed by: Evalene Coho MD 08/29/2024 12:08 PM EST RP Workstation: [id number]       Assessment/Plan: Diagnosis: 63 year old female with left caudate head intracerebral hemorrhage with intraventricular extension Does the need for close, 24  hr/day medical supervision in concert with the patient's rehab needs make it unreasonable for this patient to be served in a less intensive setting? Yes Co-Morbidities requiring supervision/potential complications:  - Hypertension -Chronic kidney disease stage III AA -History of prior stroke Due to bladder management, bowel management, safety, skin/wound care, disease management, medication administration, and patient education, does the patient require 24 hr/day rehab nursing? Yes Does the patient require coordinated care of a physician, rehab nurse, therapy disciplines of PT, OT, SLP to address physical and functional deficits in the context of the above medical diagnosis(es)? Yes Addressing deficits in the following areas: balance, endurance, locomotion, strength, transferring, bowel/bladder control, bathing, dressing, feeding, grooming, toileting, cognition, and psychosocial support Can the patient actively participate in an intensive therapy program of at least 3 hrs of  therapy per day at least 5 days per week? Yes The potential for patient to make measurable gains while on inpatient rehab is excellent Anticipated functional outcomes upon discharge from inpatient rehab are modified independent and supervision  with PT, modified independent and supervision with OT, modified independent with SLP. Estimated rehab length of stay to reach the above functional goals is: 7-10 days Anticipated discharge destination: Home Overall Rehab/Functional Prognosis: excellent   POST ACUTE RECOMMENDATIONS: This patient's condition is appropriate for continued rehabilitative care in the following setting: CIR Patient has agreed to participate in recommended program. N/A Note that insurance prior authorization may be required for reimbursement for recommended care.   Comment: Need to check on family supports. Pt wasn't able to provide me a clear picture this afternoon when I was in. Rehab Admissions Coordinator to follow up.           I have personally performed a face to face diagnostic evaluation of this patient. Additionally, I have examined the patient's medical record including any pertinent labs and radiographic images.     Thanks,   Arthea ONEIDA Gunther, MD 08/30/2024

## 2024-09-02 NOTE — Progress Notes (Signed)
 Inpatient Rehabilitation Admission Medication Review by a Pharmacist  A complete drug regimen review was completed for this patient to identify any potential clinically significant medication issues.  High Risk Drug Classes Is patient taking? Indication by Medication  Antipsychotic Yes Compazine  - N/V  Anticoagulant No   Antibiotic No   Opioid Yes Oxycodone  - pain  Antiplatelet No   Hypoglycemics/insulin No   Vasoactive Medication Yes Amlodipine  - HTN  Chemotherapy No   Other Yes Trazodone  - sleep Lipitor - HLD Protonix  - GERD Senokot - constipation Tylenol  - pain Zinc /vitamin C  - supplement     Type of Medication Issue Identified Description of Issue Recommendation(s)  Drug Interaction(s) (clinically significant)     Duplicate Therapy     Allergy     No Medication Administration End Date     Incorrect Dose     Additional Drug Therapy Needed     Significant med changes from prior encounter (inform family/care partners about these prior to discharge).    Other  Triamcinolone  ointment, vit D Resume in CIR or at discharge if needed    Clinically significant medication issues were identified that warrant physician communication and completion of prescribed/recommended actions by midnight of the next day:  No  Name of provider notified for urgent issues identified:   Provider Method of Notification:     Pharmacist comments:   Time spent performing this drug regimen review (minutes):  20   Sergio Batch, PharmD, [place], AAHIVP, CPP Infectious Disease Pharmacist 09/02/2024 1:41 PM

## 2024-09-02 NOTE — H&P (Signed)
 Physical Medicine and Rehabilitation Admission H&P        Chief Complaint  Patient presents with   Functional Debility Intraparenchymal Hemorrhage       HPI: Rhonda Cortez is a 63 year old female with PMHx of hypertension, HLD, and history of CVA in 2014 presented to [facility] on 08/27/2024.  Per chart review the patient was supposed to arrive at a friend's home around 7 PM that night but did not arrive.  When the patient answered her phone around 3 AM she was confused and had difficulty finding words.  In the ED a head CT showed acute intracranial hemorrhage from the left caudate region with extension of the intraventricular region on the left.  Neurology was consulted and the patient was admitted to the ICU.  A repeat CT head showed caudate head hemorrhage with intraventricular extension.  Patient underwent an MRI that confirmed intraventricular hemorrhage, no evidence of underlying mass. 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  Neurology recommended no antithrombotics due to ICH.  Patient was on aspirin after stroke (2014) for a number of years and then off. VTE prophylaxis SCDs. 2D echo with EF 60 to 65%.  Hospital course complicated by AKI, ongoing headaches, and new fever of unknown origin.  Prior to arrival the patient was fully independent working and driving with no use of DME.  She lives alone in a 1 level home with 2 stairs to enter.  Patient currently requires contact-guard assist to min assist for mobility.Therapy evaluations completed due to patient decreased functional mobility was admitted for a comprehensive rehab program.         Review of Systems  Constitutional:  Positive for malaise/fatigue.  HENT: Negative.    Eyes: Negative.   Respiratory: Negative.    Cardiovascular: Negative.   Gastrointestinal: Negative.   Genitourinary: Negative.   Musculoskeletal: Negative.   Skin: Negative.   Neurological:   Positive for sensory change and focal weakness.  Psychiatric/Behavioral:  Positive for depression.                  Past Medical History:  Diagnosis Date   Hypertension     Stroke [facility])               Past Surgical History:  Procedure Laterality Date   OOPHORECTOMY       TUBAL LIGATION                 Family History  Problem Relation Age of Onset   Diabetes Mother     Kidney disease Father     Hypertension Father     Hypertension Sister     Hypertension Brother     Asthma Son          Social History:  reports that she has never smoked. She has never used smokeless tobacco. She reports that she does not drink alcohol and does not use drugs. Allergies:  Allergies  No Known Allergies         Medications Prior to Admission  Medication Sig Dispense Refill   amLODipine (NORVASC) 10 MG tablet Take 10 mg by mouth daily.       Cholecalciferol (VITAMIN D-3 PO) Take 1 capsule by mouth daily.       triamcinolone  ointment (KENALOG ) 0.1 % Apply 1 Application topically 2 (two) times daily as needed (skin irritation).       [Paused] valsartan  (  DIOVAN ) 80 MG tablet Take 1 tablet (80 mg total) by mouth daily. (Patient taking differently: Take 80 mg by mouth at bedtime.) 30 tablet 2   Pitavastatin  Calcium  1 MG TABS Take 1 tablet (1 mg total) by mouth daily. START with 1 pill Monday, Wednesday, Friday for 2 weeks, then increase to every other day for 2 weeks, then take daily. (Patient not taking: Reported on 08/28/2024) 90 tablet 0              Home: Home Living Family/patient expects to be discharged to:: Inpatient rehab Living Arrangements: Alone Available Help at Discharge: Family, Available 24 hours/day Type of Home: House Home Access: Stairs to enter [employer] of Steps: 2 Entrance Stairs-Rails: None Home Layout: One level Bathroom Shower/Tub: Tub/shower unit, Health Visitor: Standard Home Equipment: Information systems manager, Grab bars - tub/shower,  Hand held shower head  Lives With: Alone   Functional History: Prior Function Prior Level of Function : Independent/Modified Independent, Driving, Working/employed Mobility Comments: No AD ADLs Comments: 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 Independent in ADL, IADL, working, driving   Functional Status:  Mobility: Bed Mobility Overal bed mobility: Needs Assistance Bed Mobility: Supine to Sit, Sit to Supine Supine to sit: Contact guard, Used rails Sit to supine: Supervision General bed mobility comments: increased time Transfers Overall transfer level: Needs assistance Equipment used: None Transfers: Sit to/from Stand Sit to Stand: Contact guard assist General transfer comment: CGA for balance and power up, cues to stay close to walker Ambulation/Gait Ambulation/Gait assistance: Contact guard assist, Min assist Gait Distance (Feet): 36 Feet Assistive device: None, 1 person hand held assist Gait Pattern/deviations: Step-through pattern, Decreased stride length General Gait Details: Slow, unsteady gait without AD and pt intermittently reaching out to environment for support so HHA provided. Gait velocity: reduced Gait velocity interpretation: 1.31 - 2.62 ft/sec, indicative of limited community ambulator   ADL: ADL Overall ADL's : Needs assistance/impaired Eating/Feeding: Set up, Sitting Grooming: Wash/dry hands, Wash/dry face, Oral care, Contact guard assist, Standing Grooming Details (indicate cue type and reason): at sink Upper Body Bathing: Set up, Sitting Lower Body Bathing: Minimal assistance, Sitting/lateral leans, Sit to/from stand Upper Body Dressing : Set up, Sitting Lower Body Dressing: Minimal assistance, Sitting/lateral leans, Sit to/from stand Lower Body Dressing Details (indicate cue type and reason): to donn socks Toilet Transfer: Contact guard assist, Ambulation, Regular Toilet, Rolling walker (2 wheels) Toilet Transfer Details (indicate cue type and reason): cues  for safety Toileting- Clothing Manipulation and Hygiene: Supervision/safety, Sitting/lateral lean Functional mobility during ADLs: Contact guard assist, Rolling walker (2 wheels)   Cognition: Cognition Overall Cognitive Status: Impaired/Different from baseline Arousal/Alertness: Awake/alert Orientation Level: Oriented to person, Oriented to place, Disoriented to time, Disoriented to situation Year: Other (Comment) (94 or 95) Month: November Day of Week: Correct Attention: Selective Selective Attention: Impaired Selective Attention Impairment: Verbal complex Memory: Impaired Memory Impairment: Storage deficit, Decreased recall of new information Awareness: Impaired Awareness Impairment: Intellectual impairment Problem Solving: Impaired Problem Solving Impairment: Verbal basic Executive Function: Initiating Initiating: Impaired Initiating Impairment: Verbal basic, Functional basic Cognition Arousal: Alert Behavior During Therapy: WFL for tasks assessed/performed Overall Cognitive Status: Impaired/Different from baseline   Physical Exam: Blood pressure 134/81, pulse 88, temperature 99.1 F (37.3 C), temperature source Oral, resp. rate 18, height 5' 3 (1.6 m), weight 63.6 kg, SpO2 97%. Physical Exam Constitutional:      General: She is not in acute distress.    Comments: Slow to arouse  HENT:  Right Ear: External ear normal.     Left Ear: External ear normal.     Nose: Nose normal.     Mouth/Throat:     Mouth: Mucous membranes are moist.  Eyes:     Conjunctiva/sclera: Conjunctivae normal.  Cardiovascular:     Rate and Rhythm: Normal rate and regular rhythm.     Heart sounds:     No gallop.  Pulmonary:     Effort: Pulmonary effort is normal. No respiratory distress.     Breath sounds: No wheezing.  Abdominal:     General: Bowel sounds are normal. There is no distension.     Tenderness: There is no abdominal tenderness.  Musculoskeletal:        General: No  deformity. Normal range of motion.     Cervical back: Normal range of motion.  Skin:    General: Skin is warm.     Coloration: Skin is not jaundiced.  Neurological:     Comments: Pt slow to arouse today. Oriented to person, place, reason she was here. Speech is low volume but clear. No focal CN findings. Senses pain and gross touch in all 4's. No limb ataxia. Normal muscle tone. MMT: pt is grossly 4+ to 5/5 in all 4's.      Psychiatric:     Comments: Flat,does cooperate.        Lab Results Last 48 Hours        Results for orders placed or performed during the hospital encounter of 08/27/24 (from the past 48 hours)  Basic metabolic panel with GFR     Status: Abnormal    Collection Time: 09/01/24  4:40 AM  Result Value Ref Range    Sodium 138 135 - 145 mmol/L    Potassium 3.9 3.5 - 5.1 mmol/L    Chloride 99 98 - 111 mmol/L    CO2 23 22 - 32 mmol/L    Glucose, Bld 126 (H) 70 - 99 mg/dL      Comment: Glucose reference range applies only to samples taken after fasting for at least 8 hours.    BUN 24 (H) 8 - 23 mg/dL    Creatinine, Ser 7.79 (H) 0.44 - 1.00 mg/dL    Calcium  9.4 8.9 - 10.3 mg/dL    GFR, Estimated 25 (L) >60 mL/min      Comment: (NOTE) Calculated using the CKD-EPI Creatinine Equation (2021)      Anion gap 16 (H) 5 - 15      Comment: Performed at [facility], [address], [place], [place] [address]  CBC     Status: Abnormal    Collection Time: 09/01/24  4:40 AM  Result Value Ref Range    WBC 11.9 (H) 4.0 - 10.5 K/uL    RBC 5.13 (H) 3.87 - 5.11 MIL/uL    Hemoglobin 15.2 (H) 12.0 - 15.0 g/dL    HCT 55.7 63.9 - 53.9 %    MCV 86.2 80.0 - 100.0 fL    MCH 29.6 26.0 - 34.0 pg    MCHC 34.4 30.0 - 36.0 g/dL    RDW 88.0 88.4 - 84.4 %    Platelets 311 150 - 400 K/uL    nRBC 0.0 0.0 - 0.2 %      Comment: Performed at [facility], [address] [place], [place] [address]  Culture, blood (Routine X 2) w Reflex to ID Panel     Status: None (Preliminary  result)  Collection Time: 09/01/24 10:09 AM    Specimen: BLOOD LEFT ARM  Result Value Ref Range    Specimen Description BLOOD LEFT ARM      Special Requests          BOTTLES DRAWN AEROBIC AND ANAEROBIC Blood Culture adequate volume    Culture          NO GROWTH < 24 HOURS Performed at [facility], [address] [place], [place] [address]      Report Status PENDING    Culture, blood (Routine X 2) w Reflex to ID Panel     Status: None (Preliminary result)    Collection Time: 09/01/24 10:18 AM    Specimen: BLOOD RIGHT HAND  Result Value Ref Range    Specimen Description BLOOD RIGHT HAND      Special Requests          BOTTLES DRAWN AEROBIC AND ANAEROBIC Blood Culture results may not be optimal due to an inadequate volume of blood received in culture bottles    Culture          NO GROWTH < 24 HOURS Performed at [facility], [address], [place], [place] [address]      Report Status PENDING    CBC     Status: Abnormal    Collection Time: 09/02/24  4:06 AM  Result Value Ref Range    WBC 12.2 (H) 4.0 - 10.5 K/uL    RBC 4.97 3.87 - 5.11 MIL/uL    Hemoglobin 14.6 12.0 - 15.0 g/dL    HCT 57.4 63.9 - 53.9 %    MCV 85.5 80.0 - 100.0 fL    MCH 29.4 26.0 - 34.0 pg    MCHC 34.4 30.0 - 36.0 g/dL    RDW 88.1 88.4 - 84.4 %    Platelets 294 150 - 400 K/uL    nRBC 0.0 0.0 - 0.2 %      Comment: Performed at [facility], [address], [place], [place] [address]  Phosphorus     Status: None    Collection Time: 09/02/24  4:06 AM  Result Value Ref Range    Phosphorus 3.6 2.5 - 4.6 mg/dL      Comment: Performed at [facility], [address], [place], [place] [address]  Magnesium     Status: None    Collection Time: 09/02/24  4:06 AM  Result Value Ref Range    Magnesium 2.0 1.7 - 2.4 mg/dL      Comment: Performed at [facility], [address] [place], [place] [address]  Basic metabolic panel with GFR     Status: Abnormal    Collection Time: 09/02/24   4:06 AM  Result Value Ref Range    Sodium 133 (L) 135 - 145 mmol/L    Potassium 3.9 3.5 - 5.1 mmol/L    Chloride 98 98 - 111 mmol/L    CO2 23 22 - 32 mmol/L    Glucose, Bld 135 (H) 70 - 99 mg/dL      Comment: Glucose reference range applies only to samples taken after fasting for at least 8 hours.    BUN 27 (H) 8 - 23 mg/dL    Creatinine, Ser 8.15 (H) 0.44 - 1.00 mg/dL    Calcium  9.0 8.9 - 10.3 mg/dL    GFR, Estimated 31 (L) >60 mL/min      Comment: (NOTE) Calculated using the CKD-EPI Creatinine Equation (2021)  Anion gap 12 5 - 15      Comment: Performed at [facility], [address], [place], [place] [address]       Imaging Results (Last 48 hours)  DG CHEST PORT 1 VIEW Result Date: 09/01/2024 EXAM: 1 VIEW XRAY OF THE CHEST 09/01/2024 10:03:48 AM COMPARISON: None available. CLINICAL HISTORY: Fever FINDINGS: LUNGS AND PLEURA: No focal pulmonary opacity. No pleural effusion. No pneumothorax. HEART AND MEDIASTINUM: No acute abnormality of the cardiac and mediastinal silhouettes. BONES AND SOFT TISSUES: No acute osseous abnormality. IMPRESSION: 1. No acute cardiopulmonary process identified. Electronically signed by: Waddell Calk MD 09/01/2024 01:37 PM EST RP Workstation: [id number]           Blood pressure 134/81, pulse 88, temperature 99.1 F (37.3 C), temperature source Oral, resp. rate 18, height 5' 3 (1.6 m), weight 63.6 kg, SpO2 97%.   Medical Problem List and Plan: 1. Functional deficits secondary to left caudate hemorrhage d/t hypertension             -patient may  shower             -ELOS/Goals: 7-10 days, supervision to mod I   2.  Antithrombotics: -DVT/anticoagulation:  Mechanical: Sequential compression devices, below knee Bilateral lower extremities             -antiplatelet therapy: n/a              -no meds d/t hemorrhage 3. Pain Management: Ongoing headaches now on oxycodone  and tylenol  prn. ?Consider Topamax at bedtime    4. Mood/Behavior/Sleep:  LCSW to follow for evaluation and support when available.              -antipsychotic agents: N/A   5. Neuropsych/cognition: This patient is capable of making decisions on her own behalf.   6. Skin/Wound Care: Routine pressure relief measures.    7. Fluids/Electrolytes/Nutrition: Monitor I&O and weight. Follow up labs CBC/CMP               -Encourage oral hydration NA 133.     9. Fever: Tmax 100.3. Chest xray and urinalysis completed and are negative. Blood cultures with no growth.  Antibiotics have not been initiated. Patient currently asymptomatic.               -11/20: WBC 12.2, continue to monitor    10. Hx of Stroke/TIA: Was on aspirin  regimen for many years but currently off. Continue holding antiplatelets.   11. Hypertension: Norvasc  and Valsartan  at home, holding Valsartan .  Monitor BP per protocol.    12. Hyperlipidemia: LDL 164. Pitavastatin  at home, now on Atorvastatin  40 mg daily.    13. AKI on CKD IIIa: Baseline 1.67--- Cr 1.8 from 2.2, continue holding valsartan . Monitor labs   14. Constipation: LBM unknown.  Placed on Colace and Miralax  with prn laxatives.         Daphne LOISE Satterfield, NP 09/02/2024  I have personally performed a face to face diagnostic evaluation of this patient and formulated the key components of the plan.  Additionally, I have personally reviewed laboratory data, imaging studies, as well as relevant notes and concur with the physician assistant's documentation above.  The patient's status has not changed from the original H&P.  Any changes in documentation from the acute care chart have been noted above.  Arthea IVAR Gunther, MD, LEELLEN

## 2024-09-03 ENCOUNTER — Inpatient Hospital Stay (HOSPITAL_COMMUNITY)

## 2024-09-03 DIAGNOSIS — D72829 Elevated white blood cell count, unspecified: Secondary | ICD-10-CM

## 2024-09-03 DIAGNOSIS — R509 Fever, unspecified: Secondary | ICD-10-CM

## 2024-09-03 LAB — CBC WITH DIFFERENTIAL/PLATELET
Abs Immature Granulocytes: 0.05 K/uL (ref 0.00–0.07)
Basophils Absolute: 0 K/uL (ref 0.0–0.1)
Basophils Relative: 0 %
Eosinophils Absolute: 0 K/uL (ref 0.0–0.5)
Eosinophils Relative: 0 %
HCT: 40.4 % (ref 36.0–46.0)
Hemoglobin: 14.1 g/dL (ref 12.0–15.0)
Immature Granulocytes: 0 %
Lymphocytes Relative: 28 %
Lymphs Abs: 3.8 K/uL (ref 0.7–4.0)
MCH: 30 pg (ref 26.0–34.0)
MCHC: 34.9 g/dL (ref 30.0–36.0)
MCV: 86 fL (ref 80.0–100.0)
Monocytes Absolute: 1.2 K/uL — ABNORMAL HIGH (ref 0.1–1.0)
Monocytes Relative: 9 %
Neutro Abs: 8.7 K/uL — ABNORMAL HIGH (ref 1.7–7.7)
Neutrophils Relative %: 63 %
Platelets: 319 K/uL (ref 150–400)
RBC: 4.7 MIL/uL (ref 3.87–5.11)
RDW: 11.9 % (ref 11.5–15.5)
WBC: 13.8 K/uL — ABNORMAL HIGH (ref 4.0–10.5)
nRBC: 0 % (ref 0.0–0.2)

## 2024-09-03 LAB — COMPREHENSIVE METABOLIC PANEL WITH GFR
ALT: 13 U/L (ref 0–44)
AST: 15 U/L (ref 15–41)
Albumin: 3.7 g/dL (ref 3.5–5.0)
Alkaline Phosphatase: 58 U/L (ref 38–126)
Anion gap: 13 (ref 5–15)
BUN: 29 mg/dL — ABNORMAL HIGH (ref 8–23)
CO2: 22 mmol/L (ref 22–32)
Calcium: 9.2 mg/dL (ref 8.9–10.3)
Chloride: 99 mmol/L (ref 98–111)
Creatinine, Ser: 1.76 mg/dL — ABNORMAL HIGH (ref 0.44–1.00)
GFR, Estimated: 32 mL/min — ABNORMAL LOW (ref >60)
Glucose, Bld: 128 mg/dL — ABNORMAL HIGH (ref 70–99)
Potassium: 4 mmol/L (ref 3.5–5.1)
Sodium: 134 mmol/L — ABNORMAL LOW (ref 135–145)
Total Bilirubin: 0.9 mg/dL (ref 0.0–1.2)
Total Protein: 7.9 g/dL (ref 6.5–8.1)

## 2024-09-03 MED ORDER — ENSURE PLUS HIGH PROTEIN PO LIQD
237.0000 mL | Freq: Three times a day (TID) | ORAL | Status: DC
  Administered 2024-09-03 – 2024-09-04 (×3): 237 mL via ORAL

## 2024-09-03 MED ORDER — BOOST / RESOURCE BREEZE PO LIQD CUSTOM
1.0000 | Freq: Three times a day (TID) | ORAL | Status: DC
  Administered 2024-09-03 (×2): 1 via ORAL

## 2024-09-03 NOTE — Evaluation (Signed)
 Speech Language Pathology Assessment and Plan  Patient Details  Name: Rhonda Cortez MRN: [id number] Date of Birth: Mar 13, 1961  SLP Diagnosis: Aphasia;Cognitive Impairments  Rehab Potential: Good ELOS: 7-9 days    Today's Date: 09/03/2024 SLP Individual Time: 8584-8551 SLP Individual Time Calculation (min): 33 min and Today's Date: 09/03/2024 SLP Missed Time: 27 Minutes Missed Time Reason: X-ray   Hospital Problem: Principal Problem:   ICH (intracerebral hemorrhage) (HCC)  Past Medical History:  Past Medical History:  Diagnosis Date   Hypertension    Stroke [facility])    Past Surgical History:  Past Surgical History:  Procedure Laterality Date   OOPHORECTOMY     TUBAL LIGATION      Assessment / Plan / Recommendation Clinical Impression  Pt is a 63 y/o female with PMH of HTN who presented to [facility] on 11/14 with AMS. In ED head CT showed acute intracranial hemorrhage from the left caudate region with extension into the intraventricular region on the left.  Repeat head CT showed intraventricular extension of hemorrhage. 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 Tolerating regular/thin liquid diet. Therapy evaluations completed and pt was recommended for CIR.   Cognitive-Linguistic Evaluation: Patient presents with cognitive-linguistic deficits in the areas of receptive/expressive language, orientation, and functional problem solving. True depth of cognitive deficits difficult to assess d/t severity of expressive language impairments. SLP administered bedside WAB to assess language function which revealed mild deficits in complex auditory comprehension, intact repetition, and moderately impaired spontaneous language, and mildly impaired confrontation naming. Patient exhibited higher levels of breakdowns during naming tasks of increased complexity (I.e. generative naming, 2 in 60 seconds). During confrontation naming tasks, patient  benefited from semantic and phonemic cues. She demonstrated perseveration variably throughout all tasks. SLP administered various limited functional problem solving tasks where patient demonstrated impaired organization, scanning, and L attention skills. Motor speech appeared overall intact however low vocal intensity and flat affect noted. Patient would benefit from ongoing SLP services targeting aforementioned deficits during CIR admission. Patient was left in lowered bed with call bell in reach and bed alarm set. SLP will continue to target goals per plan of care.    Swallowing function was informally screened. Patient endorses no difficulty with tolerance of regular/thin liquid diet. No evidence in EMR of reduced diet tolerance. No further swallowing needs identified.   *Of note, upon SLP first visit attempt, patient and transport greeted in room. Transport indicates patient is ordered to go for X-ray. Missed 27 minutes of skilled ST time due to out of room for imaging.   Skilled Therapeutic Interventions          SLP conducted skilled evaluation session to assess swallowing and cognitive-linguisitc function. Utilized Bedside WAB, functional problem solving and language task assessment, and patient and/or family interview. Full results above.   SLP Assessment  Patient will need skilled Speech Lanaguage Pathology Services during CIR admission    Recommendations  Recommendations for Other Services: Neuropsych consult Patient destination: Home Follow up Recommendations: Outpatient SLP Equipment Recommended: None recommended by SLP    SLP Frequency 3 to 5 out of 7 days   SLP Duration  SLP Intensity  SLP Treatment/Interventions 7-9 days  Minumum of 1-2 x/day, 30 to 90 minutes  Cognitive remediation/compensation;Internal/external aids;Speech/Language facilitation;Therapeutic Activities;Cueing hierarchy;Functional tasks;Patient/family education    Pain Pain Assessment Pain Scale:  0-10 Pain Score: 0-No pain  Prior Functioning Cognitive/Linguistic Baseline: Within functional limits Type of Home:  House  Lives With: Significant other Available Help at Discharge: Family;Available 24 hours/day 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[employer])  SLP Evaluation Cognition Overall Cognitive Status: Impaired/Different from baseline Arousal/Alertness: Awake/alert Orientation Level: Oriented to person;Oriented to situation;Disoriented to time;Disoriented to place Year:  (2001) Day of Week: Incorrect Attention: Selective Selective Attention: Impaired Selective Attention Impairment: Verbal basic;Functional basic Memory: Impaired Memory Impairment: Storage deficit;Decreased recall of new information Awareness: Impaired Awareness Impairment: Emergent impairment;Anticipatory impairment Problem Solving: Impaired Problem Solving Impairment: Verbal basic;Functional basic Executive Function: Landscape Architect: Impaired Organizing Impairment: Verbal basic;Functional basic Safety/Judgment: Impaired  Comprehension Auditory Comprehension Overall Auditory Comprehension: Impaired Yes/No Questions: Impaired Basic Biographical Questions: 76-100% accurate Basic Immediate Environment Questions: 75-100% accurate Complex Questions: 25-49% accurate Commands: Impaired One Step Basic Commands: 75-100% accurate Two Step Basic Commands: 50-74% accurate Multistep Basic Commands: 25-49% accurate Conversation: Simple Interfering Components: Attention EffectiveTechniques: Extra processing time;Repetition;Visual/Gestural cues Visual Recognition/Discrimination Discrimination: Within Function Limits Reading Comprehension Reading Status: Not tested Expression Expression Primary Mode of Expression: Verbal Verbal Expression Overall Verbal Expression: Impaired Initiation: No impairment Level of Generative/Spontaneous Verbalization: Phrase;Sentence Repetition: No impairment Naming:  Impairment Responsive: 51-75% accurate Confrontation: Impaired Convergent: Not tested Divergent: Not tested Verbal Errors: Semantic paraphasias;Perseveration;Not aware of errors;Aware of errors Pragmatics: Impairment Impairments: Abnormal affect;Monotone;Eye contact Interfering Components: Attention Effective Techniques: Semantic cues;Phonemic cues Non-Verbal Means of Communication: Not applicable Written Expression Dominant Hand: Right Written Expression: Not tested Oral Motor Oral Motor/Sensory Function Overall Oral Motor/Sensory Function: Within functional limits Motor Speech Overall Motor Speech: Appears within functional limits for tasks assessed Respiration: Within functional limits Phonation: Low vocal intensity Resonance: Within functional limits Articulation: Within functional limitis Intelligibility: Intelligible Motor Planning: Within functional limits Motor Speech Errors: Not applicable  Care Tool Care Tool Cognition Ability to hear (with hearing aid or hearing appliances if normally used Ability to hear (with hearing aid or hearing appliances if normally used): 0. Adequate - no difficulty in normal conservation, social interaction, listening to TV   Expression of Ideas and Wants Expression of Ideas and Wants: 2. Frequent difficulty - frequently exhibits difficulty with expressing needs and ideas   Understanding Verbal and Non-Verbal Content Understanding Verbal and Non-Verbal Content: 3. Usually understands - understands most conversations, but misses some part/intent of message. Requires cues at times to understand  Memory/Recall Ability Memory/Recall Ability : None of the above were recalled   Intelligibility: Intelligible  Oral Care Assessment Oral Assessment  (WDL): Within Defined Limits Lips: Symmetrical Teeth: Missing (Comment) Tongue: Pink Mucous Membrane(s): Moist;Pink Saliva: Moist, saliva free flowing Level of Consciousness: Alert Is patient on any  of following O2 devices?: None of the above Nutritional status: No high risk factors Oral Assessment Risk : Low Risk  Short Term Goals: Week 1: SLP Short Term Goal 1 (Week 1): STGs=LTGs d/t ELOS  Refer to Care Plan for Long Term Goals  Recommendations for other services: Neuropsych  Discharge Criteria: Patient will be discharged from SLP if patient refuses treatment 3 consecutive times without medical reason, if treatment goals not met, if there is a change in medical status, if patient makes no progress towards goals or if patient is discharged from hospital.  The above assessment, treatment plan, treatment alternatives and goals were discussed and mutually agreed upon: by patient  Nayan Proch, M.A., CCC-SLP  Elishia Kaczorowski A Daquarius Dubeau 09/03/2024, 3:10 PM

## 2024-09-03 NOTE — Progress Notes (Addendum)
 Patient ID: Rhonda Cortez, female   DOB: Dec 11, 1960, 63 y.o.   MRN: [id number] Met with the patient to review current medical situation, rehab process, team conference, and plan of care. Patient is very drowsy, nodding but no verbalizations (aphasia) and noted rubbing head (HA confirmed) but denies visual changes. Reviewed history of CVA (14) and secondary risk management including HTN, and HLD. Deferred questions to her sister.  Continue to follow along to address educational needs to facilitate preparation for discharge. Fredericka Barnie NOVAK

## 2024-09-03 NOTE — Progress Notes (Signed)
 PROGRESS NOTE   Subjective/Complaints:  Somnolent slept poorly due to kidneys but denies dysuria or freq  ROS- neg CP, SOB, No N/V/D Objective:   DG CHEST PORT 1 VIEW Result Date: 09/01/2024 EXAM: 1 VIEW XRAY OF THE CHEST 09/01/2024 10:03:48 AM COMPARISON: None available. CLINICAL HISTORY: Fever FINDINGS: LUNGS AND PLEURA: No focal pulmonary opacity. No pleural effusion. No pneumothorax. HEART AND MEDIASTINUM: No acute abnormality of the cardiac and mediastinal silhouettes. BONES AND SOFT TISSUES: No acute osseous abnormality. IMPRESSION: 1. No acute cardiopulmonary process identified. Electronically signed by: Waddell Calk MD 09/01/2024 01:37 PM EST RP Workstation: [id number]   Recent Labs    09/02/24 0406 09/03/24 0544  WBC 12.2* 13.8*  HGB 14.6 14.1  HCT 42.5 40.4  PLT 294 319   Recent Labs    09/02/24 0406 09/03/24 0544  NA 133* 134*  K 3.9 4.0  CL 98 99  CO2 23 22  GLUCOSE 135* 128*  BUN 27* 29*  CREATININE 1.84* 1.76*  CALCIUM  9.0 9.2    Intake/Output Summary (Last 24 hours) at 09/03/2024 0915 Last data filed at 09/02/2024 1940 Gross per 24 hour  Intake 60 ml  Output --  Net 60 ml        Physical Exam: Vital Signs Blood pressure (!) 143/83, pulse 96, temperature 99.6 F (37.6 C), resp. rate 16, height 5' 3 (1.6 m), weight 61.8 kg, SpO2 97%.  General: No acute distress Mood and affect are appropriate Heart: Regular rate and rhythm no rubs murmurs or extra sounds Lungs: Clear to auscultation, breathing unlabored, no rales or wheezes Abdomen: Positive bowel sounds, soft nontender to palpation, nondistended Extremities: No clubbing, cyanosis, or edema Skin: No evidence of breakdown, no evidence of rash Neurologic: Somnolent but awakens to voice, reduced attention , no aphasia or dysarthria noted oriented to person , place month and year not day and date  motor strength is 5/5 in bilateral deltoid,  bicep, tricep, grip, hip flexor, knee extensors, ankle dorsiflexor and plantar flexor Mld intention tremor RUE Sensory exam poor attention limits eval  Tone is nl Cerebellar exam normal finger to nose to finger as well as heel to shin in bilateral upper and lower extremities Musculoskeletal: Full range of motion in all 4 extremities. No joint swelling    Assessment/Plan: 1. Functional deficits which require 3+ hours per day of interdisciplinary therapy in a comprehensive inpatient rehab setting. Physiatrist is providing close team supervision and 24 hour management of active medical problems listed below. Physiatrist and rehab team continue to assess barriers to discharge/monitor patient progress toward functional and medical goals  Care Tool:  Bathing              Bathing assist       Upper Body Dressing/Undressing Upper body dressing        Upper body assist      Lower Body Dressing/Undressing Lower body dressing            Lower body assist       Toileting Toileting    Toileting assist       Transfers Chair/bed transfer  Transfers assist  Locomotion Ambulation   Ambulation assist              Walk 10 feet activity   Assist           Walk 50 feet activity   Assist           Walk 150 feet activity   Assist           Walk 10 feet on uneven surface  activity   Assist           Wheelchair     Assist               Wheelchair 50 feet with 2 turns activity    Assist            Wheelchair 150 feet activity     Assist          Blood pressure (!) 143/83, pulse 96, temperature 99.6 F (37.6 C), resp. rate 16, height 5' 3 (1.6 m), weight 61.8 kg, SpO2 97%.  Medical Problem List and Plan: 1. Functional deficits secondary to left caudate hemorrhage d/t hypertension             -patient may  shower             -ELOS/Goals: 7-10 days, supervision to mod I   2.   Antithrombotics: -DVT/anticoagulation:  Mechanical: Sequential compression devices, below knee Bilateral lower extremities             -antiplatelet therapy: n/a              -no meds d/t hemorrhage 3. Pain Management: Ongoing headaches now on oxycodone  and tylenol  prn. ?Consider Topamax at bedtime    4. Mood/Behavior/Sleep: LCSW to follow for evaluation and support when available.              -antipsychotic agents: N/A   5. Neuropsych/cognition: This patient is capable of making decisions on her own behalf.   6. Skin/Wound Care: Routine pressure relief measures.    7. Fluids/Electrolytes/Nutrition: Monitor I&O and weight. Follow up labs CBC/CMP               -Encourage oral hydration NA 133.     9. Fever: Tmax 100.3. Chest xray and urinalysis completed and are negative. Blood cultures with no growth.  Antibiotics have not been initiated. Patient currently asymptomatic.               -11/20: WBC 12.2, continue to monitor       Latest Ref Rng & Units 09/03/2024    5:44 AM 09/02/2024    4:06 AM 09/01/2024    4:40 AM  CBC  WBC 4.0 - 10.5 K/uL 13.8  12.2  11.9   Hemoglobin 12.0 - 15.0 g/dL 85.8  85.3  84.7   Hematocrit 36.0 - 46.0 % 40.4  42.5  44.2   Platelets 150 - 400 K/uL 319  294  311    UA + WBC but rare bacteria, will check urine cult Xray unremarkable  10. Hx of Stroke/TIA: Was on aspirin  regimen for many years but currently off. Continue holding antiplatelets.   11. Hypertension: Norvasc  and Valsartan  at home, holding Valsartan .  Monitor BP per protocol.  Vitals:   09/02/24 1933 09/03/24 0401  BP: (!) 145/85 (!) 143/83  Pulse: 98 96  Resp: 16 16  Temp: 99 F (37.2 C) 99.6 F (37.6 C)  SpO2: 95% 97%      12. Hyperlipidemia: LDL 164. Pitavastatin  at  home, now on Atorvastatin  40 mg daily.    13. AKI on CKD IIIa: Baseline 1.67--- Cr 1.8 from 2.2, continue holding valsartan . Monitor labs      Latest Ref Rng & Units 09/03/2024    5:44 AM 09/02/2024    4:06 AM  09/01/2024    4:40 AM  BMP  Glucose 70 - 99 mg/dL 871  864  873   BUN 8 - 23 mg/dL 29  27  24    Creatinine 0.44 - 1.00 mg/dL 8.23  8.15  7.79   Sodium 135 - 145 mmol/L 134  133  138   Potassium 3.5 - 5.1 mmol/L 4.0  3.9  3.9   Chloride 98 - 111 mmol/L 99  98  99   CO2 22 - 32 mmol/L 22  23  23    Calcium  8.9 - 10.3 mg/dL 9.2  9.0  9.4   Creat trending down , avoid nephrotoxic meds, enc po fluids   14. Constipation: LBM unknown.  Placed on Colace and Miralax  with prn laxatives.            LOS: 1 days A FACE TO FACE EVALUATION WAS PERFORMED  Prentice FORBES Compton 09/03/2024, 9:15 AM

## 2024-09-03 NOTE — Progress Notes (Signed)
 [facility]patient Rehabilitation Center Individual Statement of Services  Patient Name:  Rhonda Cortez  Date:  09/03/2024  Welcome to the Inpatient Rehabilitation Center.  Our goal is to provide you with an individualized program based on your diagnosis and situation, designed to meet your specific needs.  With this comprehensive rehabilitation program, you will be expected to participate in at least 3 hours of rehabilitation therapies Monday-Friday, with modified therapy programming on the weekends.  Your rehabilitation program will include the following services:  Physical Therapy (PT), Occupational Therapy (OT), Speech Therapy (ST), 24 hour per day rehabilitation nursing, Therapeutic Recreaction (TR), Neuropsychology, Care Coordinator, Rehabilitation Medicine, Nutrition Services, and Pharmacy Services  Weekly team conferences will be held on Wednesday to discuss your progress.  Your Inpatient Rehabilitation Care Coordinator will talk with you frequently to get your input and to update you on team discussions.  Team conferences with you and your family in attendance may also be held.  Expected length of stay: 7-9 days  Overall anticipated outcome: supervision level  Depending on your progress and recovery, your program may change. Your Inpatient Rehabilitation Care Coordinator will coordinate services and will keep you informed of any changes. Your Inpatient Rehabilitation Care Coordinator's name and contact numbers are listed  below.  The following services may also be recommended but are not provided by the Inpatient Rehabilitation Center:  Driving Evaluations Home Health Rehabiltiation Services Outpatient Rehabilitation Services Vocational Rehabilitation   Arrangements will be made to provide these services after discharge if needed.  Arrangements include referral to agencies that provide these services.  Your insurance has been verified to be:  [employer] health Your primary doctor is:   Corean Comment  Pertinent information will be shared with your doctor and your insurance company.  Inpatient Rehabilitation Care Coordinator:  Rhoda Clement, KEN [phone] or ELIGAH BASQUES  Information discussed with and copy given to patient by: Clement Asberry MATSU, 09/03/2024, 11:36 AM

## 2024-09-03 NOTE — Evaluation (Signed)
 Occupational Therapy Assessment and Plan  Patient Details  Name: Rhonda Cortez MRN: [id number] Date of Birth: Feb 20, 1961  OT Diagnosis: altered mental status, cognitive deficits, hemiplegia affecting dominant side, and muscle weakness (generalized) Rehab Potential: Rehab Potential (ACUTE ONLY): Good ELOS: 7-9 days   Today's Date: 09/03/2024 OT Individual Time: 9082-8969 OT Individual Time Calculation (min): 73 min     Hospital Problem: Principal Problem:   ICH (intracerebral hemorrhage) (HCC)   Past Medical History:  Past Medical History:  Diagnosis Date   Hypertension    Stroke [facility])    Past Surgical History:  Past Surgical History:  Procedure Laterality Date   OOPHORECTOMY     TUBAL LIGATION      Assessment & Plan Clinical Impression: Rhonda Cortez is a 63 year old female with PMHx of hypertension, HLD, and history of CVA in 2014 presented to [facility] on 08/27/2024.  Per chart review the patient was supposed to arrive at a friend's home around 7 PM that night but did not arrive.  When the patient answered her phone around 3 AM she was confused and had difficulty finding words.  In the ED a head CT showed acute intracranial hemorrhage from the left caudate region with extension of the intraventricular region on the left.  Neurology was consulted and the patient was admitted to the ICU.  A repeat CT head showed caudate head hemorrhage with intraventricular extension.  Patient underwent an MRI that confirmed intraventricular hemorrhage, no evidence of underlying mass. 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  Neurology recommended no antithrombotics due to ICH.  Patient was on aspirin  after stroke (2014) for a number of years and then off. VTE prophylaxis SCDs. 2D echo with EF 60 to 65%.  Hospital course complicated by AKI, ongoing headaches, and new fever of unknown origin.  Prior to arrival the patient was fully  independent working and driving with no use of DME. Patient transferred to CIR on 09/02/2024 .    Patient currently requires min with basic self-care skills secondary to unbalanced muscle activation, decreased midline orientation, and decreased initiation, decreased attention, decreased awareness, decreased problem solving, decreased safety awareness, decreased memory, and delayed processing.  Prior to hospitalization, patient could complete BADL/IADL with independent .  Patient will benefit from skilled intervention to decrease level of assist with basic self-care skills and increase independence with basic self-care skills prior to discharge home with care partner.  Anticipate patient will require 24 hour supervision and follow up home health.  OT - End of Session Activity Tolerance: Decreased this session Endurance Deficit: Yes Endurance Deficit Description: difficulty maintaining eyes open at end of session OT Assessment Rehab Potential (ACUTE ONLY): Good OT Barriers to Discharge: None OT Patient demonstrates impairments in the following area(s): Balance;Behavior;Safety;Endurance;Motor OT Basic ADL's Functional Problem(s): Bathing;Dressing;Toileting OT Advanced ADL's Functional Problem(s): Simple Meal Preparation;Light Housekeeping;Laundry OT Transfers Functional Problem(s): Toilet;Tub/Shower OT Additional Impairment(s): None OT Plan OT Intensity: Minimum of 1-2 x/day, 45 to 90 minutes OT Frequency: 5 out of 7 days OT Duration/Estimated Length of Stay: 7-9 days OT Treatment/Interventions: Balance/vestibular training;Community reintegration;Patient/family education;Self Care/advanced ADL retraining;Therapeutic Exercise;UE/LE Coordination activities;Cognitive remediation/compensation;Discharge planning;DME/adaptive equipment instruction;Functional mobility training;Psychosocial support;Therapeutic Activities;UE/LE Strength taining/ROM;Visual/perceptual remediation/compensation OT Self  Feeding Anticipated Outcome(s): Independent OT Basic Self-Care Anticipated Outcome(s): Supervision OT Toileting Anticipated Outcome(s): Supervision OT Bathroom Transfers Anticipated Outcome(s): Supervision OT Recommendation Recommendations for Other Services: Neuropsych consult Patient destination: Home Follow Up Recommendations:  Home health OT;Outpatient OT Equipment Recommended: To be determined   OT Evaluation Precautions/Restrictions  Precautions Precautions: Fall Restrictions Weight Bearing Restrictions Per Provider Order: No Therapy Vitals Temp: 100 F (37.8 C) Temp Source: Oral Pulse Rate: 91 Resp: 16 BP: 131/77 Patient Position (if appropriate): Lying Oxygen Therapy SpO2: 98 % O2 Device: Room Air Pain Pain Assessment Pain Score: 0-No pain Home Living/Prior Functioning Home Living Family/patient expects to be discharged to:: Private residence Living Arrangements: Alone Available Help at Discharge: Family, Available 24 hours/day Type of Home: House Home Access: Stairs to enter [employer] of Steps: 4-5 front entrance with rails, 1 step back entrance (usual entrance) no rail Entrance Stairs-Rails: Can reach both Home Layout: One level Bathroom Shower/Tub: Walk-in shower, Tub/shower unit (uses the walk in shower) Firefighter: Standard  Lives With: Significant other [name]) IADL History Homemaking Responsibilities: Yes Meal Prep Responsibility: Primary Laundry Responsibility: Primary Cleaning Responsibility: Primary Current License: Yes Mode of Transportation: Car Occupation: Full time employment Type of Occupation: HR Leisure and Hobbies: difficult for her to come up with leisure - with prompting indicates watch tv Prior Function Level of Independence: Independent with basic ADLs, Independent with homemaking with ambulation, Independent with gait, Independent with transfers  Able to Take Stairs?: Yes Driving: Yes Vocation: Full time  employment Vision Baseline Vision/History: 0 No visual deficits Ability to See in Adequate Light: 0 Adequate Patient Visual Report: No change from baseline Vision Assessment?: No apparent visual deficits Perception  Perception: Impaired Praxis Praxis: WFL Cognition Brief Interview for Mental Status (BIMS) Repetition of Three Words (First Attempt): 3 Temporal Orientation: Year: Missed by 2 to 5 years Temporal Orientation: Month: Missed by more than 1 month Temporal Orientation: Day: No answer Recall: Sock: Yes, no cue required Recall: Blue: No, could not recall Recall: Bed: No, could not recall BIMS Summary Score: 6 Sensation Sensation Light Touch: Appears Intact Hot/Cold: Appears Intact Proprioception: Appears Intact Stereognosis: Not tested Coordination Gross Motor Movements are Fluid and Coordinated: (P) Yes Fine Motor Movements are Fluid and Coordinated: Yes Motor  Motor Motor: Motor impersistence Motor - Skilled Clinical Observations: Very mild coordination/ weakness R>L  Trunk/Postural Assessment  Cervical Assessment Cervical Assessment: Within Functional Limits Thoracic Assessment Thoracic Assessment: Within Functional Limits Lumbar Assessment Lumbar Assessment: Within Functional Limits Postural Control Postural Control: Deficits on evaluation Protective Responses: Posterior bias, delayed/ absent response  Balance Balance Balance Assessed: (P) Yes Standardized Balance Assessment Standardized Balance Assessment: (P) Berg Balance Test Berg Balance Test Sit to Stand: (P) Able to stand without using hands and stabilize independently Standing Unsupported: (P) Able to stand 2 minutes with supervision Sitting with Back Unsupported but Feet Supported on Floor or Stool: (P) Able to sit 2 minutes under supervision Stand to Sit: (P) Controls descent by using hands Transfers: (P) Needs one person to assist Standing Unsupported with Eyes Closed: (P) Able to stand  10 seconds with supervision Standing Ubsupported with Feet Together: (P) Needs help to attain position but able to stand for 30 seconds with feet together (leans R) From Standing, Reach Forward with Outstretched Arm: (P) Can reach forward >12 cm safely (5) From Standing Position, Pick up Object from Floor: (P) Able to pick up shoe, needs supervision From Standing Position, Turn to Look Behind Over each Shoulder: (P) Turn sideways only but maintains balance Turn 360 Degrees: (P) Needs assistance while turning (minA) Standing Unsupported, Alternately Place Feet on Step/Stool: (P) Able to complete >2 steps/needs minimal assist Standing Unsupported, One Foot in Front: (P) Loses  balance while stepping or standing (leans R) Standing on One Leg: (P) Unable to try or needs assist to prevent fall (R lean) Total Score: (P) 27 Static Sitting Balance Static Sitting - Balance Support: No upper extremity supported;Feet supported Static Sitting - Level of Assistance: 4: Min assist Static Sitting - Comment/# of Minutes: leans backward, needs cues to keep feet on floor Dynamic Sitting Balance Dynamic Sitting - Balance Support: No upper extremity supported;Feet supported;During functional activity Dynamic Sitting - Level of Assistance: 4: Min assist Static Standing Balance Static Standing - Balance Support: Right upper extremity supported;Left upper extremity supported;During functional activity Static Standing - Level of Assistance: 4: Min assist Dynamic Standing Balance Dynamic Standing - Balance Support: Right upper extremity supported;Left upper extremity supported;During functional activity Dynamic Standing - Level of Assistance: 4: Min assist Extremity/Trunk Assessment RUE Assessment RUE Assessment: Within Functional Limits General Strength Comments: 4/5 LUE Assessment LUE Assessment: Within Functional Limits General Strength Comments: 4/5  Care Tool Care Tool Self Care Eating Eating activity  did not occur: Refused      Oral Care    Oral Care Assist Level: Supervision/Verbal cueing    Bathing   Body parts bathed by patient: Right arm;Left arm;Chest;Abdomen;Front perineal area;Buttocks;Right upper leg;Left upper leg;Right lower leg;Left lower leg;Face     Assist Level: Contact Guard/Touching assist    Upper Body Dressing(including orthotics)   What is the patient wearing?: Pull over shirt   Assist Level: Contact Guard/Touching assist    Lower Body Dressing (excluding footwear)   What is the patient wearing?: Underwear/pull up;Pants Assist for lower body dressing: Contact Guard/Touching assist    Putting on/Taking off footwear   What is the patient wearing?: Socks Assist for footwear: Supervision/Verbal cueing       Care Tool Toileting Toileting activity   Assist for toileting: Minimal Assistance - Patient > 75%     Care Tool Bed Mobility Roll left and right activity   Roll left and right assist level: Contact Guard/Touching assist    Sit to lying activity   Sit to lying assist level: Contact Guard/Touching assist    Lying to sitting on side of bed activity   Lying to sitting on side of bed assist level: the ability to move from lying on the back to sitting on the side of the bed with no back support.: Contact Guard/Touching assist     Care Tool Transfers Sit to stand transfer   Sit to stand assist level: Minimal Assistance - Patient > 75%    Chair/bed transfer   Chair/bed transfer assist level: Minimal Assistance - Patient > 75%     Toilet transfer   Assist Level: Minimal Assistance - Patient > 75%     Care Tool Cognition  Expression of Ideas and Wants Expression of Ideas and Wants: 2. Frequent difficulty - frequently exhibits difficulty with expressing needs and ideas  Understanding Verbal and Non-Verbal Content Understanding Verbal and Non-Verbal Content: 3. Usually understands - understands most conversations, but misses some part/intent of  message. Requires cues at times to understand   Memory/Recall Ability Memory/Recall Ability : That he or she is in a hospital/hospital unit   Refer to Care Plan for Long Term Goals  SHORT TERM GOAL WEEK 1 OT Short Term Goal 1 (Week 1): STG=LTG due to LOS - Overall supervision goals  Recommendations for other services: Neuropsych   Skilled Therapeutic Intervention ADL ADL Eating: Not assessed Grooming: Supervision/safety Where Assessed-Grooming: Standing at sink Upper Body Bathing: Supervision/safety;Minimal cueing Where  Assessed-Upper Body Bathing: Public Affairs Consultant Bathing: Supervision/safety;Minimal cueing Where Assessed-Lower Body Bathing: Shower Upper Body Dressing: Minimal cueing Where Assessed-Upper Body Dressing: Chair Lower Body Dressing: Contact guard Where Assessed-Lower Body Dressing: Chair Toileting: Minimal assistance Where Assessed-Toileting: Teacher, Adult Education: Curator Method: Ambulating Tub/Shower Transfer: Unable to assess Tub/Shower Transfer Method: Unable to assess [employer] Transfer: Minimal assistance Film/video Editor Method: Designer, Industrial/product: Transfer tub bench Mobility  Bed Mobility Bed Mobility: Supine to Sit;Sit to Supine Supine to Sit: Contact Guard/Touching assist Sit to Supine: Supervision/Verbal cueing Transfers Sit to Stand: Minimal Assistance - Patient > 75% Stand to Sit: Minimal Assistance - Patient > 75%   Discharge Criteria: Patient will be discharged from OT if patient refuses treatment 3 consecutive times without medical reason, if treatment goals not met, if there is a change in medical status, if patient makes no progress towards goals or if patient is discharged from hospital.  The above assessment, treatment plan, treatment alternatives and goals were discussed and mutually agreed upon: by patient  Fransico Allean HERO 09/03/2024, 12:53 PM

## 2024-09-03 NOTE — Progress Notes (Signed)
 Inpatient Rehabilitation Care Coordinator Assessment and Plan Patient Details  Name: Rhonda Cortez MRN: [id number] Date of Birth: [birth year]  Today's Date: 09/03/2024  Hospital Problems: Principal Problem:   ICH (intracerebral hemorrhage) (HCC)  Past Medical History:  Past Medical History:  Diagnosis Date   Hypertension    Stroke [facility])    Past Surgical History:  Past Surgical History:  Procedure Laterality Date   OOPHORECTOMY     TUBAL LIGATION     Social History:  reports that she has never smoked. She has never used smokeless tobacco. She reports that she does not drink alcohol and does not use drugs.  Family / Support Systems Marital Status: Single Patient Roles: Parent, Other (Comment) (employee) Children: [place] [phone]  [place] [phone]  Has a daughter and daughter in-law Other Supports: Irish-sister [phone] Anticipated Caregiver: Children to rotate staying with at her home Ability/Limitations of Caregiver: Children do work but will work something out regarding this. Hopefully this will be short term Caregiver Availability: 24/7 Family Dynamics: Close with three children and sibling. 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 Return to Work Plans: Will need to recover to be able to return to work and will have additional therapies after rehab stay Legal History/Current Legal Issues: NA Guardian/Conservator: None-according to MD pt is capable of making her own decisions while here   Abuse/Neglect Abuse/Neglect Assessment Can Be Completed: Yes Physical Abuse:  Denies Verbal Abuse: Denies Sexual Abuse: Denies Exploitation of patient/patient's resources: Denies Self-Neglect: Denies  Patient response to: Social Isolation - How often do you feel lonely or isolated from those around you?: Never  Emotional Status Pt's affect, behavior and adjustment status: Pt is quite sleepy today did not sleep well last night. But is lethargic from her bleed and she is working on this. She hopes to be as close to mod/i as possible when she leaves here. She has always been independent and taken care of herself Recent Psychosocial Issues: other health issues Psychiatric History: No history would benefit from seeing neuro-psych while here, due to age and independence prior to admission Substance Abuse History: NA  Patient / Family Perceptions, Expectations & Goals Pt/Family understanding of illness & functional limitations: Pt and son can explain her brain bleed both are hopeful she will do well and recover from this. Pt has done better and the hopes is this will continue while here Premorbid pt/family roles/activities: mom, sister, employee, friend, co-worker, etc Anticipated changes in roles/activities/participation: resume Pt/family expectations/goals: Pt states:  I want to do well here and do for myself.  Son states:  We hope she does well there.  Community [employer] Agencies: None Premorbid Home Care/DME Agencies: Other (Comment) (tub seat with grab bars) Transportation available at discharge: self and family Is the patient able to respond to transportation needs?: Yes In the past 12 months, has lack of transportation kept you from medical appointments or from getting medications?: No In the past 12 months, has lack of transportation kept you from meetings, work, or from getting things needed for daily living?: No Resource referrals recommended: Neuropsychology  Discharge Planning Living Arrangements: Alone Support Systems: Children, Other  relatives, Friends/neighbors Type of Residence: Private residence Insurance Resources: Media Planner (specify) Haematologist) Financial Resources: Employment Financial Screen Referred: No Living Expenses: Psychologist, Sport And Exercise Management: Patient Does the patient have any problems obtaining your medications?: No Home Management: self Patient/Family Preliminary Plans: Return home with children amd family coming in to provide supervision or assist. Depends upon the therapy evaluations today and will work on discharge needs. Made aware to bring in work forms if needed to be completed. Care Coordinator Barriers to Discharge: Insurance for SNF coverage Care Coordinator Anticipated Follow Up Needs: HH/OP  Clinical Impression Pleasant but lethargic female who is sleepy and at times difficult to arouse. She was independent lived alone and worked prior to admission. Her children plan to rotate to be able to provide 24/7 supervision at discharge. Will await therapy team evaluations.  [name] MATSU 09/03/2024, 11:34 AM

## 2024-09-03 NOTE — Plan of Care (Signed)
  Problem: RH Cognition - SLP Goal: RH LTG Patient will demonstrate orientation with cues Description:  LTG:  Patient will demonstrate orientation to person/place/time/situation with cues (SLP)   Flowsheets (Taken 09/03/2024 1515) LTG Patient will demonstrate orientation to:  Person  Place  Time  Situation LTG: Patient will demonstrate orientation using cueing (SLP): Supervision   Problem: RH Comprehension Communication Goal: LTG Patient will comprehend basic/complex auditory (SLP) Description: LTG: Patient will comprehend basic/complex auditory information with cues (SLP). Flowsheets (Taken 09/03/2024 1515) LTG: Patient will comprehend: Complex auditory information LTG: Patient will comprehend auditory information with cueing (SLP): Supervision   Problem: RH Expression Communication Goal: LTG Patient will increase word finding of common (SLP) Description: LTG:  Patient will increase word finding of common objects/daily info/abstract thoughts with cues using compensatory strategies (SLP). Flowsheets (Taken 09/03/2024 1515) LTG: Patient will increase word finding of common (SLP): Minimal Assistance - Patient > 75% Patient will use compensatory strategies to increase word finding of: Daily info   Problem: RH Problem Solving Goal: LTG Patient will demonstrate problem solving for (SLP) Description: LTG:  Patient will demonstrate problem solving for basic/complex daily situations with cues  (SLP) Flowsheets (Taken 09/03/2024 1515) LTG: Patient will demonstrate problem solving for (SLP): Basic daily situations LTG Patient will demonstrate problem solving for: Minimal Assistance - Patient > 75%

## 2024-09-03 NOTE — Plan of Care (Signed)
 Problem: RH Balance Goal: LTG: Patient will maintain dynamic sitting balance (OT) Description: LTG:  Patient will maintain dynamic sitting balance with assistance during activities of daily living (OT) Flowsheets (Taken 09/03/2024 1255) LTG: Pt will maintain dynamic sitting balance during ADLs with: Independent Goal: LTG Patient will maintain dynamic standing with ADLs (OT) Description: LTG:  Patient will maintain dynamic standing balance with assist during activities of daily living (OT)  Flowsheets (Taken 09/03/2024 1255) LTG: Pt will maintain dynamic standing balance during ADLs with: Supervision/Verbal cueing   Problem: Sit to Stand Goal: LTG:  Patient will perform sit to stand in prep for activites of daily living with assistance level (OT) Description: LTG:  Patient will perform sit to stand in prep for activites of daily living with assistance level (OT) Flowsheets (Taken 09/03/2024 1255) LTG: PT will perform sit to stand in prep for activites of daily living with assistance level: Supervision/Verbal cueing   Problem: RH Bathing Goal: LTG Patient will bathe all body parts with assist levels (OT) Description: LTG: Patient will bathe all body parts with assist levels (OT) Flowsheets (Taken 09/03/2024 1255) LTG: Pt will perform bathing with assistance level/cueing: Supervision/Verbal cueing   Problem: RH Dressing Goal: LTG Patient will perform upper body dressing (OT) Description: LTG Patient will perform upper body dressing with assist, with/without cues (OT). Flowsheets (Taken 09/03/2024 1255) LTG: Pt will perform upper body dressing with assistance level of: Set up assist Goal: LTG Patient will perform lower body dressing w/assist (OT) Description: LTG: Patient will perform lower body dressing with assist, with/without cues in positioning using equipment (OT) Flowsheets (Taken 09/03/2024 1255) LTG: Pt will perform lower body dressing with assistance level of: Supervision/Verbal  cueing   Problem: RH Toileting Goal: LTG Patient will perform toileting task (3/3 steps) with assistance level (OT) Description: LTG: Patient will perform toileting task (3/3 steps) with assistance level (OT)  Flowsheets (Taken 09/03/2024 1255) LTG: Pt will perform toileting task (3/3 steps) with assistance level: Supervision/Verbal cueing   Problem: RH Toilet Transfers Goal: LTG Patient will perform toilet transfers w/assist (OT) Description: LTG: Patient will perform toilet transfers with assist, with/without cues using equipment (OT) Flowsheets (Taken 09/03/2024 1255) LTG: Pt will perform toilet transfers with assistance level of: Supervision/Verbal cueing   Problem: RH Tub/Shower Transfers Goal: LTG Patient will perform tub/shower transfers w/assist (OT) Description: LTG: Patient will perform tub/shower transfers with assist, with/without cues using equipment (OT) Flowsheets (Taken 09/03/2024 1255) LTG: Pt will perform tub/shower stall transfers with assistance level of: Supervision/Verbal cueing   Problem: RH Memory Goal: LTG Patient will demonstrate ability for day to day recall/carry over during activities of daily living with assistance level (OT) Description: LTG:  Patient will demonstrate ability for day to day recall/carry over during activities of daily living with assistance level (OT). Flowsheets (Taken 09/03/2024 1255) LTG:  Patient will demonstrate ability for day to day recall/carry over during activities of daily living with assistance level (OT): Minimal Assistance - Patient > 75%   Problem: RH Attention Goal: LTG Patient will demonstrate this level of attention during functional activites (OT) Description: LTG:  Patient will demonstrate this level of attention during functional activites  (OT) Flowsheets (Taken 09/03/2024 1255) Patient will demonstrate this level of attention during functional activites: Sustained Patient will demonstrate above attention level in  the following environment: Home LTG: Patient will demonstrate this level of attention during functional activites (OT): Minimal Assistance - Patient > 75%   Problem: RH Awareness Goal: LTG: Patient will demonstrate awareness during functional  activites type of (OT) Description: LTG: Patient will demonstrate awareness during functional activites type of (OT) Flowsheets (Taken 09/03/2024 1255) Patient will demonstrate awareness during functional activites type of: Emergent LTG: Patient will demonstrate awareness during functional activites type of (OT): Minimal Assistance - Patient > 75%

## 2024-09-03 NOTE — IPOC Note (Signed)
 Overall Plan of Care [facility]) Patient Details Name: Rhonda Cortez MRN: [id number] DOB: 1961/06/07  Admitting Diagnosis: ICH (intracerebral hemorrhage) [facility])  Hospital Problems: Principal Problem:   ICH (intracerebral hemorrhage) (HCC)     Functional Problem List: Nursing Safety, Bowel, Endurance, Medication Management  PT Balance, Endurance, Motor, Nutrition, Pain, Perception, Safety, Sensory, Skin Integrity  OT Balance, Behavior, Safety, Endurance, Motor  SLP Cognition, Linguistic  TR         Basic ADL's: OT Bathing, Dressing, Toileting     Advanced  ADL's: OT Simple Meal Preparation, Light Housekeeping, Laundry     Transfers: PT Bed Mobility, Bed to Chair, Set Designer, [employer], Civil Service Fast Streamer, Research Scientist (life Sciences): PT Ambulation, Psychologist, Prison And Probation Services, Stairs     Additional Impairments: OT None  SLP Communication, Social Cognition comprehension, expression Problem Solving  TR      Anticipated Outcomes Item Anticipated Outcome  Self Feeding Independent  Dispensing Optician Transfers Supervision  Bowel/Bladder  manage bowel w mod I assist  Transfers  supervision overall  Locomotion  supervision ambulatory and stairs  Communication  min assist  Cognition  min assist  Pain  n/a  Safety/Judgment  manage safety w cues   Therapy Plan: PT Intensity: Minimum of 1-2 x/day ,45 to 90 minutes PT Frequency: 5 out of 7 days PT Duration Estimated Length of Stay: 7-9 days OT Intensity: Minimum of 1-2 x/day, 45 to 90 minutes OT Frequency: 5 out of 7 days OT Duration/Estimated Length of Stay: 7-9 days SLP Intensity: Minumum of 1-2 x/day, 30 to 90 minutes SLP Frequency: 3 to 5 out of 7 days SLP Duration/Estimated Length of Stay: 7-9 days   Team Interventions: Nursing Interventions Medication Management, Discharge Planning, Bowel Management, Patient/Family Education  PT interventions Ambulation/gait  training, Warden/ranger, Cognitive remediation/compensation, Community reintegration, Discharge planning, Disease management/prevention, DME/adaptive equipment instruction, Functional mobility training, Neuromuscular re-education, Pain management, Patient/family education, Psychosocial support, Skin care/wound management, Splinting/orthotics, Stair training, Therapeutic Activities, Therapeutic Exercise, UE/LE Strength taining/ROM, UE/LE Coordination activities, Visual/perceptual remediation/compensation, Wheelchair propulsion/positioning  OT Interventions Warden/ranger, Firefighter, Equities Trader education, Self Care/advanced ADL retraining, Therapeutic Exercise, UE/LE Coordination activities, Cognitive remediation/compensation, Discharge planning, DME/adaptive equipment instruction, Functional mobility training, Psychosocial support, Therapeutic Activities, UE/LE Strength taining/ROM, Visual/perceptual remediation/compensation  SLP Interventions Cognitive remediation/compensation, Internal/external aids, Speech/Language facilitation, Therapeutic Activities, Cueing hierarchy, Functional tasks, Patient/family education  TR Interventions    SW/CM Interventions Discharge Planning, Psychosocial Support, Patient/Family Education   Barriers to Discharge MD  Medical stability  Nursing Lack of/limited family support 1 level, 2 ste no rail solo; sons to come stay with her;Independent in ADL, IADL, working, driving PTA  PT None    OT None    SLP      SW Insurance for SNF coverage     Team Discharge Planning: Destination: PT-Home ,OT- Home , SLP-Home Projected Follow-up: PT-Outpatient PT, 24 hour supervision/assistance, OT-  Home health OT, Outpatient OT, SLP-Outpatient SLP Projected Equipment Needs: PT-To be determined, OT- To be determined, SLP-None recommended by SLP Equipment Details: PT- , OT-  Patient/family involved in discharge planning: PT- Patient,  Family member/caregiver,  OT-Patient, SLP-Patient  MD ELOS: 7-9d Medical Rehab Prognosis:  Good Assessment: The patient has been admitted for CIR therapies with the diagnosis of ICH. The team will be addressing functional mobility, strength, stamina, balance, safety, adaptive techniques and equipment, self-care, bowel and bladder mgt, patient and caregiver education, BP management .  Goals have been set at [facility]. Anticipated discharge destination is home with family support .        See Team Conference Notes for weekly updates to the plan of care

## 2024-09-03 NOTE — Evaluation (Signed)
 Physical Therapy Assessment and Plan  Patient Details  Name: Rhonda Cortez MRN: [id number] Date of Birth: Dec 16, 1960  PT Diagnosis: Difficulty walking, Hemiplegia dominant, Impaired cognition, Muscle weakness, Pain in joint, and Pain in head Rehab Potential: Good ELOS: 7-9 days   Today's Date: 09/03/2024 PT Individual Time: 1100-1200 PT Individual Time Calculation (min): 60 min    Hospital Problem: Principal Problem:   ICH (intracerebral hemorrhage) (HCC)   Past Medical History:  Past Medical History:  Diagnosis Date   Hypertension    Stroke [facility])    Past Surgical History:  Past Surgical History:  Procedure Laterality Date   OOPHORECTOMY     TUBAL LIGATION      Assessment & Plan Clinical Impression: Patient is a 63 year old female with PMHx of hypertension, HLD, and history of CVA in 2014 presented to [facility] on 08/27/2024.  Per chart review the patient was supposed to arrive at a friend's home around 7 PM that night but did not arrive.  When the patient answered her phone around 3 AM she was confused and had difficulty finding words.  In the ED a head CT showed acute intracranial hemorrhage from the left caudate region with extension of the intraventricular region on the left.  Neurology was consulted and the patient was admitted to the ICU.  A repeat CT head showed caudate head hemorrhage with intraventricular extension.  Patient underwent an MRI that confirmed intraventricular hemorrhage, no evidence of underlying mass. 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  Neurology recommended no antithrombotics due to ICH.  Patient was on aspirin  after stroke (2014) for a number of years and then off. VTE prophylaxis SCDs. 2D echo with EF 60 to 65%.  Hospital course complicated by AKI, ongoing headaches, and new fever of unknown origin.  Prior to arrival the patient was fully independent working and driving with no use  of DME.  She lives alone in a 1 level home with 2 stairs to enter.  Patient currently requires contact-guard assist to min assist for mobility.Therapy evaluations completed due to patient decreased functional mobility was admitted for a comprehensive rehab program. Patient transferred to CIR on 09/02/2024 .   Patient currently requires min with mobility secondary to muscle weakness and muscle joint tightness, decreased cardiorespiratoy endurance, unbalanced muscle activation, decreased coordination, and decreased motor planning, decreased midline orientation, decreased initiation, decreased awareness, decreased safety awareness, and decreased memory, and decreased sitting balance, decreased standing balance, decreased postural control, hemiplegia, and decreased balance strategies.  Prior to hospitalization, patient was independent  with mobility and lived with Significant other [name]) in a House home.  Home access is 4-5 front entrance with rails, 1 step back entrance (usual entrance) no railStairs to enter.  Patient will benefit from skilled PT intervention to maximize safe functional mobility, minimize fall risk, and decrease caregiver burden for planned discharge home with 24 hour supervision.  Anticipate patient will benefit from follow up OP at discharge.  PT - End of Session Activity Tolerance: Decreased this session;Tolerates 30+ min activity with multiple rests Endurance Deficit: Yes Endurance Deficit Description: difficulty maintaining eyes open at end of session PT Assessment Rehab Potential (ACUTE/IP ONLY): Good PT Barriers to Discharge: None PT Patient demonstrates impairments in the following area(s): Balance;Endurance;Motor;Nutrition;Pain;Perception;Safety;Sensory;Skin Integrity PT Transfers Functional Problem(s): Bed Mobility;Bed to Chair;Car;Furniture;Floor PT Locomotion Functional Problem(s): Ambulation;Wheelchair Mobility;Stairs PT Plan PT Intensity: Minimum of 1-2 x/day ,45 to 90  minutes PT Frequency: 5 out of 7 days PT Duration Estimated Length of Stay: 7-9 days PT Treatment/Interventions: Ambulation/gait training;Balance/vestibular training;Cognitive remediation/compensation;Community reintegration;Discharge planning;Disease management/prevention;DME/adaptive equipment instruction;Functional mobility training;Neuromuscular re-education;Pain management;Patient/family education;Psychosocial support;Skin care/wound management;Splinting/orthotics;Stair training;Therapeutic Activities;Therapeutic Exercise;UE/LE Strength taining/ROM;UE/LE Coordination activities;Visual/perceptual remediation/compensation;Wheelchair propulsion/positioning PT Transfers Anticipated Outcome(s): supervision overall PT Locomotion Anticipated Outcome(s): supervision ambulatory and stairs PT Recommendation Recommendations for Other Services: Neuropsych consult;Therapeutic Recreation consult Therapeutic Recreation Interventions: Warehouse Manager;Outing/community reintergration Follow Up Recommendations: Outpatient PT;24 hour supervision/assistance Patient destination: Home Equipment Recommended: To be determined   PT Evaluation Precautions/Restrictions Precautions Precautions: Fall Restrictions Weight Bearing Restrictions Per Provider Order: No  Pain C/o pain in head and legs - premedicated. Repositioned as able.  Pain Interference Pain Interference Pain Effect on Sleep: 2. Occasionally Pain Interference with Therapy Activities: 2. Occasionally Pain Interference with Day-to-Day Activities: 2. Occasionally Home Living/Prior Functioning Home Living Living Arrangements: Alone Available Help at Discharge: Family;Available 24 hours/day Type of Home: House Home Access: Stairs to enter [employer] of Steps: 4-5 front entrance with rails, 1 step back entrance (usual entrance) no rail  Lives With: Significant other [name]) Prior Function Level of Independence:  Independent with basic ADLs;Independent with homemaking with ambulation;Independent with gait;Independent with transfers  Able to Take Stairs?: Yes Driving: Yes Vocation: Full time employment Vision/Perception  Vision - History Ability to See in Adequate Light: 0 Adequate Perception Perception: Impaired Preception Impairment Details: Spatial orientation Praxis Praxis: WFL  Cognition Overall Cognitive Status: Impaired/Different from baseline Arousal/Alertness: Lethargic Orientation Level: Oriented to person;Oriented to situation Memory: Impaired Awareness: Impaired Safety/Judgment: Impaired Sensation Sensation Light Touch: Appears Intact Hot/Cold: Appears Intact Proprioception: Appears Intact Stereognosis: Not tested Coordination Gross Motor Movements are Fluid and Coordinated: Yes Fine Motor Movements are Fluid and Coordinated: Yes Motor  Motor Motor: Motor impersistence;Abnormal postural alignment and control Motor - Skilled Clinical Observations: Very mild coordination/ weakness R>L; noted more with fatigue in RLE during gait as well as R lateral lean   Trunk/Postural Assessment  Cervical Assessment Cervical Assessment: Within Functional Limits Thoracic Assessment Thoracic Assessment: Within Functional Limits Lumbar Assessment Lumbar Assessment: Within Functional Limits Postural Control Postural Control: Deficits on evaluation Protective Responses: R lateral lean with fatigue, delayed responses  Balance Balance Balance Assessed: Yes Standardized Balance Assessment Standardized Balance Assessment: Berg Balance Test Berg Balance Test Sit to Stand: Able to stand without using hands and stabilize independently Standing Unsupported: Able to stand 2 minutes with supervision Sitting with Back Unsupported but Feet Supported on Floor or Stool: Able to sit 2 minutes under supervision Stand to Sit: Controls descent by using hands Transfers: Needs one person to  assist Standing Unsupported with Eyes Closed: Able to stand 10 seconds with supervision Standing Ubsupported with Feet Together: Needs help to attain position but able to stand for 30 seconds with feet together (leans R) From Standing, Reach Forward with Outstretched Arm: Can reach forward >12 cm safely (5) From Standing Position, Pick up Object from Floor: Able to pick up shoe, needs supervision From Standing Position, Turn to Look Behind Over each Shoulder: Turn sideways only but maintains balance Turn 360 Degrees: Needs assistance while turning (minA) Standing Unsupported, Alternately Place Feet on Step/Stool: Able to complete >2 steps/needs minimal assist Standing Unsupported, One Foot in Front: Loses balance while stepping or standing (leans R) Standing on One Leg: Unable to try or needs assist to prevent fall (R lean) Total Score: 27 Static Sitting Balance Static Sitting - Balance Support: No upper extremity supported;Feet supported Static Sitting - Level of Assistance:  5: Stand by assistance Static Sitting - Comment/# of Minutes: leans backward, needs cues to keep feet on floor Dynamic Sitting Balance Dynamic Sitting - Balance Support: No upper extremity supported;Feet supported;During functional activity Dynamic Sitting - Level of Assistance: 5: Stand by assistance Static Standing Balance Static Standing - Balance Support: Right upper extremity supported;Left upper extremity supported;During functional activity Static Standing - Level of Assistance: 4: Min assist Dynamic Standing Balance Dynamic Standing - Balance Support: Right upper extremity supported;Left upper extremity supported;During functional activity Dynamic Standing - Level of Assistance: 4: Min assist Extremity Assessment  RUE Assessment RUE Assessment: Within Functional Limits General Strength Comments: 4/5 LUE Assessment LUE Assessment: Within Functional Limits General Strength Comments: 4/5 RLE Assessment RLE  Assessment: Exceptions to [facility] General Strength Comments: grossly WFL with testing, does demonstrate decreased functional strength with gait as fatigues, decreased hip flexion activation and DF LLE Assessment LLE Assessment: Within Functional Limits  Care Tool Care Tool Bed Mobility Roll left and right activity   Roll left and right assist level: Supervision/Verbal cueing    Sit to lying activity   Sit to lying assist level: Supervision/Verbal cueing    Lying to sitting on side of bed activity   Lying to sitting on side of bed assist level: the ability to move from lying on the back to sitting on the side of the bed with no back support.: Contact Guard/Touching assist     Care Tool Transfers Sit to stand transfer   Sit to stand assist level: Contact Guard/Touching assist    Chair/bed transfer   Chair/bed transfer assist level: Contact Guard/Touching assist    Car transfer   Car transfer assist level: Contact Guard/Touching assist      Care Tool Locomotion Ambulation   Assist level: Minimal Assistance - Patient > 75% Assistive device: No Device Max distance: 150  Walk 10 feet activity   Assist level: Minimal Assistance - Patient > 75% Assistive device: No Device   Walk 50 feet with 2 turns activity   Assist level: Minimal Assistance - Patient > 75% Assistive device: No Device  Walk 150 feet activity   Assist level: Minimal Assistance - Patient > 75% Assistive device: No Device  Walk 10 feet on uneven surfaces activity   Assist level: Minimal Assistance - Patient > 75% Assistive device:  (no device)  Stairs   Assist level: Minimal Assistance - Patient > 75% Stairs assistive device: 2 hand rails Max number of stairs: 12  Walk up/down 1 step activity   Walk up/down 1 step (curb) assist level: Minimal Assistance - Patient > 75%    Walk up/down 4 steps activity   Walk up/down 4 steps assist level: Minimal Assistance - Patient > 75%    Walk up/down 12 steps activity    Walk up/down 12 steps assist level: Minimal Assistance - Patient > 75%    Pick up small objects from floor   Pick up small object from the floor assist level: Contact Guard/Touching assist    Wheelchair Is the patient using a wheelchair?: Yes Type of Wheelchair: Manual   Wheelchair assist level: Dependent - Patient 0%    Wheel 50 feet with 2 turns activity   Assist Level: Dependent - Patient 0%  Wheel 150 feet activity   Assist Level: Dependent - Patient 0%    Refer to Care Plan for Long Term Goals  SHORT TERM GOAL WEEK 1 PT Short Term Goal 1 (Week 1): = LTGs  Recommendations for other services: Neuropsych  and Adult Nurse group, Stress management, and Outing/community reintegration  Skilled Therapeutic Intervention  Evaluation completed (see details above and below) with education on PT POC and goals and individual treatment initiated with focus on functional mobility including transfers, gait and stairs, simulated car transfer, administered fall risk assessment and education with pt and pt's significant other. Initially pt very lethargic difficulty with maintaining alertness, keeping eyes closed and reporting fatigue. Encouragement provided and significant other present to provide support as well. Pt able to come to EOB with S/CGA to scoot forward with cues for initiation at times needed. Performed CGA transfer into w/c and transported to gym for energy conservation/time management. Performed car transfer with CGA for balance and cues for technique. Gait over uneven surface up/down ramp for community mobility assessment with min A overall without device. Performed mobility without device up t0 150' at end of session with min A but as fatigued noted to demonstrate R lateral lean, decreased RLE clearance and hip flexion activation as well as throughout noted narrow BOS and decreased step length, some sway at times as well. Overall pt able to participate throughout  session with rest breaks and encouragement. As the session continued, pt more alert and slightly more talkative though continues to demonstrate overall decreased recall of information about herself (ex. How her and significant other met, what she likes to do, etc). Often requires repeated cues for initiation of task. Returned back to bed at end of session with all needs in reach.  Mobility Bed Mobility Bed Mobility: Rolling Right;Supine to Sit;Sit to Supine Rolling Right: Supervision/verbal cueing Supine to Sit: Contact Guard/Touching assist Sit to Supine: Supervision/Verbal cueing Transfers Transfers: Sit to Stand;Stand to Sit;Stand Pivot Transfers;Squat Pivot Transfers Sit to Stand: Contact Guard/Touching assist Stand to Sit: Contact Guard/Touching assist Stand Pivot Transfers: Contact Guard/Touching assist Stand Pivot Transfer Details: Verbal cues for technique;Tactile cues for posture;Tactile cues for initiation;Verbal cues for precautions/safety Transfer (Assistive device): None Locomotion  Gait Gait Distance (Feet): 150 Feet Assistive device: None Gait Gait Pattern: Impaired Stairs / Additional Locomotion Stairs: Yes Stairs Assistance: Minimal Assistance - Patient > 75% Stair Management Technique: Two rails;Alternating pattern;Step to pattern Number of Stairs: 12 Ramp: Minimal Assistance - Patient >75% Wheelchair Mobility Wheelchair Mobility: No   Discharge Criteria: Patient will be discharged from PT if patient refuses treatment 3 consecutive times without medical reason, if treatment goals not met, if there is a change in medical status, if patient makes no progress towards goals or if patient is discharged from hospital.  The above assessment, treatment plan, treatment alternatives and goals were discussed and mutually agreed upon: by patient  Elnor Donald Sherrell Donald WENDI Elnor, PT, DPT, CBIS  09/03/2024, 2:06 PM

## 2024-09-04 ENCOUNTER — Inpatient Hospital Stay (HOSPITAL_COMMUNITY)

## 2024-09-04 ENCOUNTER — Encounter (HOSPITAL_COMMUNITY): Payer: Self-pay

## 2024-09-04 ENCOUNTER — Other Ambulatory Visit: Payer: Self-pay | Admitting: Internal Medicine

## 2024-09-04 ENCOUNTER — Inpatient Hospital Stay (HOSPITAL_COMMUNITY)
Admit: 2024-09-04 | Discharge: 2024-09-08 | DRG: 064 | Disposition: A | Discharge status: Rehabilitation Facility | Source: Other Acute Inpatient Hospital | Attending: Internal Medicine | Admitting: Internal Medicine

## 2024-09-04 DIAGNOSIS — I129 Hypertensive chronic kidney disease with stage 1 through stage 4 chronic kidney disease, or unspecified chronic kidney disease: Secondary | ICD-10-CM | POA: Diagnosis present

## 2024-09-04 DIAGNOSIS — R29810 Facial weakness: Secondary | ICD-10-CM | POA: Diagnosis present

## 2024-09-04 DIAGNOSIS — Z8419 Family history of other disorders of kidney and ureter: Secondary | ICD-10-CM | POA: Diagnosis not present

## 2024-09-04 DIAGNOSIS — Z833 Family history of diabetes mellitus: Secondary | ICD-10-CM | POA: Diagnosis not present

## 2024-09-04 DIAGNOSIS — E782 Mixed hyperlipidemia: Secondary | ICD-10-CM | POA: Diagnosis present

## 2024-09-04 DIAGNOSIS — I610 Nontraumatic intracerebral hemorrhage in hemisphere, subcortical: Secondary | ICD-10-CM | POA: Diagnosis not present

## 2024-09-04 DIAGNOSIS — R471 Dysarthria and anarthria: Secondary | ICD-10-CM | POA: Diagnosis present

## 2024-09-04 DIAGNOSIS — Z8249 Family history of ischemic heart disease and other diseases of the circulatory system: Secondary | ICD-10-CM

## 2024-09-04 DIAGNOSIS — D72829 Elevated white blood cell count, unspecified: Secondary | ICD-10-CM | POA: Diagnosis not present

## 2024-09-04 DIAGNOSIS — R569 Unspecified convulsions: Secondary | ICD-10-CM

## 2024-09-04 DIAGNOSIS — G8191 Hemiplegia, unspecified affecting right dominant side: Secondary | ICD-10-CM | POA: Diagnosis present

## 2024-09-04 DIAGNOSIS — I779 Disorder of arteries and arterioles, unspecified: Secondary | ICD-10-CM | POA: Diagnosis not present

## 2024-09-04 DIAGNOSIS — I651 Occlusion and stenosis of basilar artery: Secondary | ICD-10-CM | POA: Diagnosis present

## 2024-09-04 DIAGNOSIS — G936 Cerebral edema: Secondary | ICD-10-CM | POA: Diagnosis present

## 2024-09-04 DIAGNOSIS — I63532 Cerebral infarction due to unspecified occlusion or stenosis of left posterior cerebral artery: Principal | ICD-10-CM | POA: Diagnosis present

## 2024-09-04 DIAGNOSIS — I639 Cerebral infarction, unspecified: Secondary | ICD-10-CM | POA: Diagnosis not present

## 2024-09-04 DIAGNOSIS — Z8679 Personal history of other diseases of the circulatory system: Secondary | ICD-10-CM

## 2024-09-04 DIAGNOSIS — Z79899 Other long term (current) drug therapy: Secondary | ICD-10-CM

## 2024-09-04 DIAGNOSIS — N1832 Chronic kidney disease, stage 3b: Secondary | ICD-10-CM | POA: Diagnosis present

## 2024-09-04 DIAGNOSIS — R509 Fever, unspecified: Secondary | ICD-10-CM | POA: Diagnosis not present

## 2024-09-04 DIAGNOSIS — I10 Essential (primary) hypertension: Secondary | ICD-10-CM | POA: Diagnosis present

## 2024-09-04 DIAGNOSIS — N179 Acute kidney failure, unspecified: Secondary | ICD-10-CM

## 2024-09-04 DIAGNOSIS — G4089 Other seizures: Secondary | ICD-10-CM | POA: Diagnosis present

## 2024-09-04 DIAGNOSIS — I618 Other nontraumatic intracerebral hemorrhage: Secondary | ICD-10-CM

## 2024-09-04 DIAGNOSIS — I951 Orthostatic hypotension: Secondary | ICD-10-CM | POA: Diagnosis present

## 2024-09-04 DIAGNOSIS — Z825 Family history of asthma and other chronic lower respiratory diseases: Secondary | ICD-10-CM | POA: Diagnosis not present

## 2024-09-04 DIAGNOSIS — R531 Weakness: Secondary | ICD-10-CM | POA: Diagnosis not present

## 2024-09-04 DIAGNOSIS — E785 Hyperlipidemia, unspecified: Secondary | ICD-10-CM | POA: Diagnosis not present

## 2024-09-04 DIAGNOSIS — I615 Nontraumatic intracerebral hemorrhage, intraventricular: Secondary | ICD-10-CM | POA: Diagnosis present

## 2024-09-04 DIAGNOSIS — R29706 NIHSS score 6: Secondary | ICD-10-CM | POA: Diagnosis present

## 2024-09-04 DIAGNOSIS — Z8673 Personal history of transient ischemic attack (TIA), and cerebral infarction without residual deficits: Secondary | ICD-10-CM

## 2024-09-04 DIAGNOSIS — I69120 Aphasia following nontraumatic intracerebral hemorrhage: Secondary | ICD-10-CM | POA: Diagnosis not present

## 2024-09-04 DIAGNOSIS — I69198 Other sequelae of nontraumatic intracerebral hemorrhage: Secondary | ICD-10-CM | POA: Diagnosis not present

## 2024-09-04 LAB — URINALYSIS, W/ REFLEX TO CULTURE (INFECTION SUSPECTED)
Bilirubin Urine: NEGATIVE
Glucose, UA: NEGATIVE mg/dL
Hgb urine dipstick: NEGATIVE
Ketones, ur: NEGATIVE mg/dL
Nitrite: NEGATIVE
Protein, ur: 100 mg/dL — AB
Specific Gravity, Urine: 1.017 (ref 1.005–1.030)
pH: 5 (ref 5.0–8.0)

## 2024-09-04 LAB — GLUCOSE, CAPILLARY: Glucose-Capillary: 179 mg/dL — ABNORMAL HIGH (ref 70–99)

## 2024-09-04 MED ORDER — LABETALOL HCL 5 MG/ML IV SOLN
5.0000 mg | INTRAVENOUS | Status: DC | PRN
Start: 2024-09-04 — End: 2024-09-05

## 2024-09-04 MED ORDER — ATORVASTATIN CALCIUM 40 MG PO TABS
40.0000 mg | ORAL_TABLET | Freq: Every day | ORAL | Status: DC
  Administered 2024-09-05 – 2024-09-08 (×4): 40 mg via ORAL
  Filled 2024-09-04 (×4): qty 1

## 2024-09-04 MED ORDER — AMLODIPINE BESYLATE 5 MG PO TABS
10.0000 mg | ORAL_TABLET | Freq: Every day | ORAL | Status: DC
  Administered 2024-09-05 – 2024-09-08 (×4): 10 mg via ORAL
  Filled 2024-09-04 (×4): qty 2

## 2024-09-04 MED ORDER — SODIUM CHLORIDE 0.9 % IV SOLN: INTRAVENOUS | Status: DC

## 2024-09-04 MED ORDER — LEVETIRACETAM (KEPPRA) 500 MG/5 ML ADULT IV PUSH: 500.0000 mg | Freq: Two times a day (BID) | INTRAVENOUS | Status: DC

## 2024-09-04 MED ORDER — MELATONIN 5 MG PO TABS
5.0000 mg | ORAL_TABLET | Freq: Every evening | ORAL | Status: DC | PRN
  Filled 2024-09-04: qty 1

## 2024-09-04 MED ORDER — ACETAMINOPHEN 325 MG PO TABS
650.0000 mg | ORAL_TABLET | Freq: Four times a day (QID) | ORAL | Status: DC | PRN
Start: 2024-09-04 — End: 2024-09-06

## 2024-09-04 MED ORDER — IOHEXOL 350 MG/ML SOLN
75.0000 mL | Freq: Once | INTRAVENOUS | Status: AC | PRN
  Administered 2024-09-04: 75 mL via INTRAVENOUS

## 2024-09-04 MED ORDER — TRAZODONE HCL 50 MG PO TABS: 50.0000 mg | ORAL_TABLET | Freq: Every day | ORAL | Status: DC

## 2024-09-04 MED ORDER — SORBITOL 70 % SOLN
60.0000 mL | Freq: Once | Status: AC
  Administered 2024-09-04: 60 mL via ORAL
  Filled 2024-09-04: qty 60

## 2024-09-04 MED ORDER — LEVETIRACETAM (KEPPRA) 500 MG/5 ML ADULT IV PUSH: 500.0000 mg | Freq: Once | INTRAVENOUS | Status: DC

## 2024-09-04 MED ORDER — LEVETIRACETAM (KEPPRA) 500 MG/5 ML ADULT IV PUSH
500.0000 mg | Freq: Two times a day (BID) | INTRAVENOUS | Status: DC
  Administered 2024-09-05: 500 mg via INTRAVENOUS
  Filled 2024-09-04 (×2): qty 5

## 2024-09-04 MED ORDER — LEVETIRACETAM (KEPPRA) 500 MG/5 ML ADULT IV PUSH: 1500.0000 mg | Freq: Once | INTRAVENOUS | Status: DC

## 2024-09-04 MED ORDER — ACETAMINOPHEN 650 MG RE SUPP: 650.0000 mg | Freq: Four times a day (QID) | RECTAL | Status: DC | PRN

## 2024-09-04 MED ORDER — LEVETIRACETAM (KEPPRA) 500 MG/5 ML ADULT IV PUSH
1500.0000 mg | Freq: Once | INTRAVENOUS | Status: AC
  Administered 2024-09-04: 1500 mg via INTRAVENOUS
  Filled 2024-09-04: qty 15

## 2024-09-04 NOTE — Progress Notes (Signed)
 Speech Language Pathology Daily Session Note  Patient Details  Name: Rhonda Cortez MRN: [id number] Date of Birth: 1961-07-12  Today's Date: 09/04/2024 SLP Individual Time: 0822-0905 SLP Individual Time Calculation (min): 43 min  Short Term Goals: Week 1: SLP Short Term Goal 1 (Week 1): STGs=LTGs d/t ELOS  Skilled Therapeutic Interventions: SLP conducted skilled therapy session targeting communication and cognitive goals. Upon SLP entry, patient highly lethargic, lamenting that she did not get any sleep last night. With copious encouragement, agreeable to participate in therapy tasks. Patient benefited from min to mod assist for responsive naming. Patient oriented to year with min to mod assist, spontaneously much closer than previous date (2024 vs. 2001). SLP provided visual aid to promote ongoing orientation. She required mod assist to orient to year.  Throughout session, she benefited from mod assist for sustained attention d/t lethargy. MD entered and patient communicated concerns re: sleep with supervision. Patient was left in room with call bell in reach and alarm set. SLP will continue to target goals per plan of care.        Pain  None   Therapy/Group: Individual Therapy  Kline Bulthuis, M.A., CCC-SLP  Othel Hoogendoorn A Guerline Happ 09/04/2024, 9:08 AM

## 2024-09-04 NOTE — Plan of Care (Signed)
  Problem: RH BOWEL ELIMINATION Goal: RH STG MANAGE BOWEL WITH ASSISTANCE Description: STG Manage Bowel with mod I Assistance. Outcome: Progressing   Problem: RH BOWEL ELIMINATION Goal: RH STG MANAGE BOWEL W/MEDICATION W/ASSISTANCE Description: STG Manage Bowel with Medication with mod I  Assistance. Outcome: Not Progressing

## 2024-09-04 NOTE — Progress Notes (Signed)
 PROGRESS NOTE   Subjective/Complaints:  More alert although pt states sleep was not good , son at bedside no concerns at this time, discussed CVA deficits and rehab process   ROS- neg CP, SOB, No N/V/D Objective:   DG Abd 1 View Result Date: 09/03/2024 EXAM: 1 VIEW XRAY OF THE ABDOMEN 09/03/2024 03:17:00 PM COMPARISON: 12/20/2022 CLINICAL HISTORY: Constipation. FINDINGS: BOWEL: Nonobstructive bowel gas pattern. Moderate amount of stool was noted throughout the colon. SOFT TISSUES: No opaque urinary calculi. BONES: No acute osseous abnormality. IMPRESSION: 1. Moderate colonic stool burden consistent with constipation. Electronically signed by: Lynwood Seip MD 09/03/2024 03:39 PM EST RP Workstation: [id number]   DG Chest 2 View Result Date: 09/03/2024 EXAM: 2 VIEW(S) XRAY OF THE CHEST 09/03/2024 03:17:00 PM COMPARISON: Comparison 09/01/2024. CLINICAL HISTORY: 358444 Constipation 358444 Constipation FINDINGS: LUNGS AND PLEURA: No focal pulmonary opacity. No pleural effusion. No pneumothorax. HEART AND MEDIASTINUM: No acute abnormality of the cardiac and mediastinal silhouettes. BONES AND SOFT TISSUES: No acute osseous abnormality. IMPRESSION: 1. No acute cardiopulmonary process identified. Electronically signed by: Lynwood Seip MD 09/03/2024 03:37 PM EST RP Workstation: [id number]   Recent Labs    09/02/24 0406 09/03/24 0544  WBC 12.2* 13.8*  HGB 14.6 14.1  HCT 42.5 40.4  PLT 294 319   Recent Labs    09/02/24 0406 09/03/24 0544  NA 133* 134*  K 3.9 4.0  CL 98 99  CO2 23 22  GLUCOSE 135* 128*  BUN 27* 29*  CREATININE 1.84* 1.76*  CALCIUM  9.0 9.2    Intake/Output Summary (Last 24 hours) at 09/04/2024 0856 Last data filed at 09/03/2024 1303 Gross per 24 hour  Intake 100 ml  Output --  Net 100 ml        Physical Exam: Vital Signs Blood pressure (!) 136/95, pulse (!) 110, temperature (!) 100.4 F (38 C), temperature  source Oral, resp. rate 16, height 5' 3 (1.6 m), weight 58.8 kg, SpO2 98%.  General: No acute distress Mood and affect are appropriate Heart: Regular rate and rhythm no rubs murmurs or extra sounds Lungs: Clear to auscultation, breathing unlabored, no rales or wheezes Abdomen: Positive bowel sounds, soft nontender to palpation, nondistended Extremities: No clubbing, cyanosis, or edema Skin: No evidence of breakdown, no evidence of rash Neurologic: Somnolent but awakens to voice, reduced attention , no aphasia or dysarthria noted oriented to person , place month and year not day and date  motor strength is 5/5 in bilateral deltoid, bicep, tricep, grip, hip flexor, knee extensors, ankle dorsiflexor and plantar flexor Mld intention tremor RUE Sensory exam poor attention limits eval  Tone is nl Cerebellar exam normal finger to nose to finger as well as heel to shin in bilateral upper and lower extremities Musculoskeletal: Full range of motion in all 4 extremities. No joint swelling    Assessment/Plan: 1. Functional deficits which require 3+ hours per day of interdisciplinary therapy in a comprehensive inpatient rehab setting. Physiatrist is providing close team supervision and 24 hour management of active medical problems listed below. Physiatrist and rehab team continue to assess barriers to discharge/monitor patient progress toward functional and medical goals  Care Tool:  Bathing  Body parts bathed by patient: Right arm, Left arm, Chest, Abdomen, Front perineal area, Buttocks, Right upper leg, Left upper leg, Right lower leg, Left lower leg, Face         Bathing assist Assist Level: Contact Guard/Touching assist     Upper Body Dressing/Undressing Upper body dressing   What is the patient wearing?: Pull over shirt    Upper body assist Assist Level: Contact Guard/Touching assist    Lower Body Dressing/Undressing Lower body dressing      What is the patient wearing?:  Underwear/pull up, Pants     Lower body assist Assist for lower body dressing: Contact Guard/Touching assist     Toileting Toileting    Toileting assist Assist for toileting: Minimal Assistance - Patient > 75%     Transfers Chair/bed transfer  Transfers assist     Chair/bed transfer assist level: Contact Guard/Touching assist     Locomotion Ambulation   Ambulation assist      Assist level: Minimal Assistance - Patient > 75% Assistive device: No Device Max distance: 150   Walk 10 feet activity   Assist     Assist level: Minimal Assistance - Patient > 75% Assistive device: No Device   Walk 50 feet activity   Assist    Assist level: Minimal Assistance - Patient > 75% Assistive device: No Device    Walk 150 feet activity   Assist    Assist level: Minimal Assistance - Patient > 75% Assistive device: No Device    Walk 10 feet on uneven surface  activity   Assist     Assist level: Minimal Assistance - Patient > 75% Assistive device:  (no device)   Wheelchair     Assist Is the patient using a wheelchair?: Yes Type of Wheelchair: Manual    Wheelchair assist level: Dependent - Patient 0%      Wheelchair 50 feet with 2 turns activity    Assist        Assist Level: Dependent - Patient 0%   Wheelchair 150 feet activity     Assist      Assist Level: Dependent - Patient 0%   Blood pressure (!) 136/95, pulse (!) 110, temperature (!) 100.4 F (38 C), temperature source Oral, resp. rate 16, height 5' 3 (1.6 m), weight 58.8 kg, SpO2 98%.  Medical Problem List and Plan: 1. Functional deficits secondary to left caudate hemorrhage d/t hypertension             -patient may  shower             -ELOS/Goals: 7-10 days, supervision to mod I   2.  Antithrombotics: -DVT/anticoagulation:  Mechanical: Sequential compression devices, below knee Bilateral lower extremities             -antiplatelet therapy: n/a              -no meds  d/t hemorrhage 3. Pain Management: Ongoing headaches now on oxycodone  and tylenol  prn. ?Consider Topamax at bedtime    4. Mood/Behavior/Sleep: LCSW to follow for evaluation and support when available.              -antipsychotic agents: N/A   5. Neuropsych/cognition: This patient is capable of making decisions on her own behalf.   6. Skin/Wound Care: Routine pressure relief measures.    7. Fluids/Electrolytes/Nutrition: Monitor I&O and weight. Follow up labs CBC/CMP               -Encourage oral hydration NA 133.  9. Fever: Tmax 100.3. Chest xray and urinalysis completed and are negative. Blood cultures with no growth.  Antibiotics have not been initiated. Patient currently asymptomatic.               -11/20: WBC 12.2, continue to monitor       Latest Ref Rng & Units 09/03/2024    5:44 AM 09/02/2024    4:06 AM 09/01/2024    4:40 AM  CBC  WBC 4.0 - 10.5 K/uL 13.8  12.2  11.9   Hemoglobin 12.0 - 15.0 g/dL 85.8  85.3  84.7   Hematocrit 36.0 - 46.0 % 40.4  42.5  44.2   Platelets 150 - 400 K/uL 319  294  311   Continued low grade temp, no other symptoms,  10. Hx of Stroke/TIA: Was on aspirin  regimen for many years but currently off. Continue holding antiplatelets.   11. Hypertension: Norvasc  and Valsartan  at home, holding Valsartan .  Monitor BP per protocol.  Vitals:   09/03/24 2018 09/04/24 0541  BP: (!) 148/88 (!) 136/95  Pulse: 95 (!) 110  Resp: 16 16  Temp: 98.6 F (37 C) (!) 100.4 F (38 C)  SpO2: 99% 98%      12. Hyperlipidemia: LDL 164. Pitavastatin  at home, now on Atorvastatin  40 mg daily.    13. AKI on CKD IIIa: Baseline 1.67--- Cr 1.8 from 2.2, continue holding valsartan . Monitor labs      Latest Ref Rng & Units 09/03/2024    5:44 AM 09/02/2024    4:06 AM 09/01/2024    4:40 AM  BMP  Glucose 70 - 99 mg/dL 871  864  873   BUN 8 - 23 mg/dL 29  27  24    Creatinine 0.44 - 1.00 mg/dL 8.23  8.15  7.79   Sodium 135 - 145 mmol/L 134  133  138   Potassium 3.5 -  5.1 mmol/L 4.0  3.9  3.9   Chloride 98 - 111 mmol/L 99  98  99   CO2 22 - 32 mmol/L 22  23  23    Calcium  8.9 - 10.3 mg/dL 9.2  9.0  9.4   Creat trending down , avoid nephrotoxic meds, enc po fluids   14. Constipation: LBM unknown.  Placed on Colace and Miralax  with prn laxatives.            LOS: 2 days A FACE TO FACE EVALUATION WAS PERFORMED  Prentice FORBES Compton 09/04/2024, 8:56 AM

## 2024-09-04 NOTE — Progress Notes (Signed)
 EEG equipment transferred to [phone] with patient. [facility] aware.

## 2024-09-04 NOTE — Progress Notes (Signed)
 Patient ambulated to the bathroom. Patient was able to have Bm and void. This nurse helped patient get cleaned up with CG assist, patient did have right side lean from baseline. When trying to get patient back to bed , patient became very unstable and not abe to follow commands. Patient was able to to make it back to bed with assist from nurse and son. Notified MD , Charge nurse and Code STROKE called for acute changes. Vital signs and CBG stable.  New orders obtained.    Nat Hacker LPN

## 2024-09-04 NOTE — Code Documentation (Signed)
 Stroke Response Nurse Documentation Code Documentation  Edythe Riches Acero is a 63 y.o. female admitted to [facility]  on 08/27/2024 for ICH with past medical hx of recent left caudate ICH with IVH, HTN, CKD. On No antithrombotic. Code stroke was activated by RN Nichole .   Patient on unit where she was LKW at 1140 and now complaining of headache, right sided weakness, decrease responsiveness, not following commands. Per RN, patient walked to the bathroom in her normal state of health and on the way back from the bathroom she was lethargic, dead weight, and not following commands.  Stroke team at the bedside after patient activation. Patient to CT with team. NIHSS 13, see documentation for details and code stroke times. Patient with decreased LOC, disoriented, left gaze preference , right arm weakness, right leg weakness, Expressive aphasia , and right neglect on exam. The following imaging was completed:  CT Head and CTA. Patient is not a candidate for IV Thrombolytic due to due to recent ICH per MD. Patient is not a candidate for IR due to no LVO noted on imaging per MD.   Care/Plan: readmit to [facility]; VS/NIHSS q2hr x12hr, then q4hr; BP Goal <220/120.   Process Delays Noted: none  Bedside handoff with RN Nichole.    Annabella DELENA Bame  Stroke Response RN

## 2024-09-04 NOTE — Progress Notes (Signed)
 Initial Nutrition Assessment  DOCUMENTATION CODES:   Not applicable  INTERVENTION:  Continue Ensure Plus High Protein po TID, each supplement provides 350 kcal and 20 grams of protein.  Added Magic cup TID with meals, each supplement provides 290 kcal and 9 grams of protein  Monitor for bowel movement, recommend appropriate adjustments if pt does not have bowel movement soon  NUTRITION DIAGNOSIS:   Inadequate oral intake related to poor appetite as evidenced by meal completion < 50%.  GOAL:   Patient will meet greater than or equal to 90% of their needs  MONITOR:   PO intake, Supplement acceptance  REASON FOR ASSESSMENT:   Consult Assessment of nutrition requirement/status, Poor PO  ASSESSMENT:   Pt with hx of HTN, HLD, and CVA (2014). Recent admission for [facility] 11/14-11/20. Pt admitted to rehab for decreased functional mobility.  RD consulted for assessment and poor PO intake. RD working remotely at time of assessment. Information obtained from chart review. Attempted to reach out to pt x 2, but unsuccessful, pt may be participating in rehab.   Pt ordered a regular diet, only 2 documented meals in chart, one meal was refused the other was a completion of 30%. Per MD assessment, pt dealing with constipation which could be impacting appetite. Nursing reports pt has not had bowel movement since 11/14. Pt also has not been sleeping well and hydration status poor. Pt would benefit from ONS like Ensure to encourage intake, currently ordered TID, will continue intervention. Will also add magic cups to trays.  Unable to conduct physical exam, but wt trending down. Per review, pt has lost 11% of wt in 6 months which is clinically significant for the timeframe. Suspect pt likely malnourished but unable to diagnose at present time. Will reassess at follow up  Medications: Vitamin C  1000 mg daily Ensure Plus TID Protonix  Miralax  daily Senna BID Zinc  sulfate daily  Labs: Sodium  134 BUN 29/ Cr 1.76  NUTRITION - FOCUSED PHYSICAL EXAM:  Deferred to follow up  Diet Order:   Diet Order             Diet regular Room service appropriate? Yes; Fluid consistency: Thin  Diet effective now                   EDUCATION NEEDS:   No education needs have been identified at this time  Skin:  Skin Assessment: Reviewed RN Assessment  Last BM:  per nursing report 11/14  Height:   Ht Readings from Last 1 Encounters:  09/02/24 5' 3 (1.6 m)    Weight:   Wt Readings from Last 1 Encounters:  09/04/24 58.8 kg    Ideal Body Weight:  52.3 kg  BMI:  Body mass index is 22.96 kg/m.  Estimated Nutritional Needs:   Kcal:  1500-1700  Protein:  65-85g  Fluid:  1.5-1.7L    Josette Glance, MS, RDN, LDN Clinical Dietitian I Please reach out via secure chat

## 2024-09-04 NOTE — H&P (Signed)
 History and Physical    AILANIE Cortez FMW:[id number] DOB: 1961-05-08 DOA: 09/04/2024  PCP: Rhonda Krabbe, NP  Patient coming from: Inpatient rehab  Chief Complaint: Seizure-like activity  HPI: Rhonda Cortez is a 63 y.o. female with medical history significant of hypertension, hyperlipidemia, CKD stage IIIa, previous stroke/TIA.  Recent hospital admission 08/27/2024 for intracranial hemorrhage secondary to hypertension.  Patient was discharged to CIR on 11/20.  Today while at [facility], code stroke was called due to patient having acute onset right-sided weakness and rigidity, decreased responsiveness, and not following commands.  CT head was showing resolving left caudate and left lateral ventricular hemorrhage without new hemorrhage.  CTA head and neck showing high-grade, near occlusive stenosis in the distal left M1 segment and proximal left P2 segment, moderate tandem stenosis in the proximal right P2 segment and proximal right P3 segment, moderate stenosis in the mid basilar artery, mild narrowing in the distal left A2 segment, and minimal atherosclerotic calcification at the right carotid bifurcation and minimal atherosclerotic changes in the proximal left ICA, both without significant stenosis.  Patient was not a candidate for IV thrombolytics due to recent intracranial hemorrhage.  Also not a candidate for EVT due to no LVO noted on imaging per neurology.  Neurology felt that the patient's symptoms were concerning for seizure in the setting of recent intracranial hemorrhage.  She was started on LTM EEG monitoring and was given Keppra  1500 mg load and started on maintenance Keppra  500 mg twice daily.  Neurology recommended obtaining brain MRI.  Patient was transferred to hospitalist service.  Patient is currently quite somnolent but arousable.  She is able to tell me her name and is aware that she is at a hospital.  She tells me I had a stroke.  Otherwise not able to give any additional history.  She  has no complaints.  Review of Systems:  Review of Systems  All other systems reviewed and are negative.   Past Medical History:  Diagnosis Date   Hypertension    Stroke [facility])     Past Surgical History:  Procedure Laterality Date   OOPHORECTOMY     TUBAL LIGATION       reports that she has never smoked. She has never used smokeless tobacco. She reports that she does not drink alcohol and does not use drugs.  No Known Allergies  Family History  Problem Relation Age of Onset   Diabetes Mother    Kidney disease Father    Hypertension Father    Hypertension Sister    Hypertension Brother    Asthma Son     Prior to Admission medications   Medication Sig Start Date End Date Taking? Authorizing Provider  acetaminophen  (TYLENOL ) 325 MG tablet Take 2 tablets (650 mg total) by mouth every 4 (four) hours as needed for mild pain (pain score 1-3), fever or headache (or temp > 37.5 C (99.5 F)). 09/02/24   Pokhrel, Laxman, MD  amLODipine  (NORVASC ) 10 MG tablet Take 10 mg by mouth daily.    [provider]  atorvastatin  (LIPITOR) 40 MG tablet Take 1 tablet (40 mg total) by mouth daily. 09/03/24   Pokhrel, Laxman, MD  Cholecalciferol (VITAMIN D-3 PO) Take 1 capsule by mouth daily.    [provider]  oxyCODONE  (OXY IR/ROXICODONE ) 5 MG immediate release tablet Take 1 tablet (5 mg total) by mouth every 6 (six) hours as needed for moderate pain (pain score 4-6) or severe pain (pain score 7-10). 09/02/24   Pokhrel,  Laxman, MD  pantoprazole  (PROTONIX ) 40 MG tablet Take 1 tablet (40 mg total) by mouth at bedtime. 09/02/24   Pokhrel, Laxman, MD  polyethylene glycol (MIRALAX ) 17 g packet Take 17 g by mouth daily as needed for moderate constipation or severe constipation. 09/02/24   Pokhrel, Laxman, MD  senna-docusate (SENOKOT-S) 8.6-50 MG tablet Take 1 tablet by mouth 2 (two) times daily. 09/02/24   Pokhrel, Laxman, MD  triamcinolone  ointment (KENALOG ) 0.1 % Apply 1 Application  topically 2 (two) times daily as needed (skin irritation).    [provider]  valsartan  (DIOVAN ) 80 MG tablet Take 1 tablet (80 mg total) by mouth daily. Patient taking differently: Take 80 mg by mouth at bedtime. 02/03/23   Rhonda Krabbe, NP    Physical Exam: Vitals:   09/04/24 1859  BP: (!) 146/81  Pulse: 100  Resp: 20  SpO2: 95%    Physical Exam Vitals reviewed.  Constitutional:      General: She is not in acute distress. HENT:     Head: Normocephalic and atraumatic.  Eyes:     Comments: Unable to examine as patient kept her eyes closed  Cardiovascular:     Rate and Rhythm: Normal rate and regular rhythm.     Heart sounds: Normal heart sounds.  Pulmonary:     Effort: Pulmonary effort is normal. No respiratory distress.     Breath sounds: Normal breath sounds.  Abdominal:     General: Bowel sounds are normal.     Palpations: Abdomen is soft.     Tenderness: There is no abdominal tenderness. There is no guarding.  Musculoskeletal:     Cervical back: Normal range of motion.     Right lower leg: No edema.     Left lower leg: No edema.  Skin:    General: Skin is warm and dry.  Neurological:     Mental Status: She is alert.     Comments: Moving all extremities on command, no focal weakness     Labs on Admission: I have personally reviewed following labs and imaging studies  CBC: Recent Labs  Lab 08/30/24 0144 08/31/24 0148 09/01/24 0440 09/02/24 0406 09/03/24 0544  WBC 12.2* 11.5* 11.9* 12.2* 13.8*  NEUTROABS  --   --   --   --  8.7*  HGB 14.3 15.3* 15.2* 14.6 14.1  HCT 43.2 44.6 44.2 42.5 40.4  MCV 88.2 86.4 86.2 85.5 86.0  PLT 326 349 311 294 319   Basic Metabolic Panel: Recent Labs  Lab 08/30/24 0144 08/31/24 0148 09/01/24 0440 09/02/24 0406 09/03/24 0544  NA 138 138 138 133* 134*  K 3.7 3.7 3.9 3.9 4.0  CL 103 103 99 98 99  CO2 22 22 23 23 22   GLUCOSE 183* 126* 126* 135* 128*  BUN 26* 25* 24* 27* 29*  CREATININE 1.95* 1.81*  2.20* 1.84* 1.76*  CALCIUM  8.8* 9.0 9.4 9.0 9.2  MG  --   --   --  2.0  --   PHOS  --   --   --  3.6  --    GFR: Estimated Creatinine Clearance: 27.4 mL/min (A) (by C-G formula based on SCr of 1.76 mg/dL (H)). Liver Function Tests: Recent Labs  Lab 09/03/24 0544  AST 15  ALT 13  ALKPHOS 58  BILITOT 0.9  PROT 7.9  ALBUMIN 3.7   No results for input(s): LIPASE, AMYLASE in the last 168 hours. No results for input(s): AMMONIA in the last 168 hours. Coagulation Profile:  No results for input(s): INR, PROTIME in the last 168 hours. Cardiac Enzymes: No results for input(s): CKTOTAL, CKMB, CKMBINDEX, TROPONINI in the last 168 hours. BNP (last 3 results) No results for input(s): PROBNP in the last 8760 hours. HbA1C: No results for input(s): HGBA1C in the last 72 hours. CBG: Recent Labs  Lab 09/04/24 1157  GLUCAP 179*   Lipid Profile: No results for input(s): CHOL, HDL, LDLCALC, TRIG, CHOLHDL, LDLDIRECT in the last 72 hours. Thyroid Function Tests: No results for input(s): TSH, T4TOTAL, FREET4, T3FREE, THYROIDAB in the last 72 hours. Anemia Panel: No results for input(s): VITAMINB12, FOLATE, FERRITIN, TIBC, IRON, RETICCTPCT in the last 72 hours. Urine analysis:    Component Value Date/Time   COLORURINE YELLOW 09/04/2024 0100   APPEARANCEUR HAZY (A) 09/04/2024 0100   LABSPEC 1.017 09/04/2024 0100   PHURINE 5.0 09/04/2024 0100   GLUCOSEU NEGATIVE 09/04/2024 0100   HGBUR NEGATIVE 09/04/2024 0100   BILIRUBINUR NEGATIVE 09/04/2024 0100   KETONESUR NEGATIVE 09/04/2024 0100   PROTEINUR 100 (A) 09/04/2024 0100   UROBILINOGEN 0.2 09/14/2016 1714   NITRITE NEGATIVE 09/04/2024 0100   LEUKOCYTESUR SMALL (A) 09/04/2024 0100    Radiological Exams on Admission: CT ANGIO HEAD NECK W WO CM (CODE STROKE) Result Date: 09/04/2024 EXAM: CTA Head and Neck with Intravenous Contrast. CT Head without Contrast. CLINICAL HISTORY: Neuro  deficit, acute, stroke suspected. TECHNIQUE: Axial CTA images of the head and neck performed with and without intravenous contrast. MIP reconstructed images were created and reviewed. Axial computed tomography images of the head/brain performed without intravenous contrast. Note: Per PQRS, the description of internal carotid artery percent stenosis, including 0 percent or normal exam, is based on [employer] Symptomatic Carotid Endarterectomy Trial (NASCET) criteria. Dose reduction technique was used including one or more of the following: automated exposure control, adjustment of mA and kV according to patient size, and/or iterative reconstruction. CONTRAST: Without and with; 75 mL (iohexol  (OMNIPAQUE ) 350 MG/ML injection 75 mL IOHEXOL  350 MG/ML SOLN). COMPARISON: MR head without contrast and MRA head without contrast 08/29/2024. FINDINGS: CT HEAD: BRAIN: No acute intraparenchymal hemorrhage. No mass lesion. No CT evidence for acute territorial infarct. No midline shift or extra-axial collection. VENTRICLES: No hydrocephalus. ORBITS: The orbits are unremarkable. SINUSES AND MASTOIDS: The paranasal sinuses and mastoid air cells are clear. CTA NECK: COMMON CAROTID ARTERIES: No significant stenosis. No dissection or occlusion. INTERNAL CAROTID ARTERIES: Minimal atherosclerotic calcification was present at the right carotid bifurcation without significant stenosis. Minimal atherosclerotic changes are present in the proximal left ICA without significant stenosis. No dissection or occlusion. VERTEBRAL ARTERIES: The left vertebral artery is slightly dominant to the right. No significant stenosis. No dissection or occlusion. CTA HEAD: ANTERIOR CEREBRAL ARTERIES: Mild narrowing is present in the distal left A2 segment. No occlusion. No aneurysm. MIDDLE CEREBRAL ARTERIES: High-grade near occlusive stenosis is present in the distal left M1 segment. Asymmetric attenuation of left MCA branch vessels is noted. No aneurysm.  POSTERIOR CEREBRAL ARTERIES: A high-grade, near occlusive stenosis is present in the proximal left P2 segment. Moderate tandem stenoses are present in the proximal right P2 segment and proximal right P3 segment. Distal PCA branch vessels are visualized on the right. No aneurysm. BASILAR ARTERY: Moderate stenosis is present in the mid basilar artery. No occlusion. No aneurysm. OTHER: SOFT TISSUES: No acute finding. No masses or lymphadenopathy. BONES: No acute osseous abnormality. IMPRESSION: 1. High-grade, near occlusive stenosis in the distal left M1 segment and proximal left P2 segment. 2. Moderate tandem stenoses  in the proximal right P2 segment and proximal right P3 segment. 3. 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 4. Mild narrowing in the distal left A2 segment. 5. Minimal atherosclerotic calcification at the right carotid bifurcation and minimal atherosclerotic changes in the proximal left ICA, both without significant stenosis. Electronically signed by: Lonni Necessary MD 09/04/2024 12:37 PM EST RP Workstation: [id number]   CT HEAD CODE STROKE WO CONTRAST` Result Date: 09/04/2024 EXAM: CT HEAD WITHOUT 09/04/2024 12:20:07 PM TECHNIQUE: CT of the head was performed without the administration of intravenous contrast. Automated exposure control, iterative reconstruction, and/or weight based adjustment of the mA/kV was utilized to reduce the radiation dose to as low as reasonably achievable. COMPARISON: CT head without contrast 08/28/2024 and MR head without contrast 08/29/2024. CLINICAL HISTORY: Seizure, new-onset, no history of trauma; Stroke, follow up. Legs became rigid while sitting on the toilet. Symptoms have since resolved. FINDINGS: BRAIN AND VENTRICLES: Focal hemorrhage in the left caudate and left lateral ventricle is less prominent than on the prior studies. No new hemorrhage is present. Periventricular and subcortical white matter changes bilaterally are moderately advanced for age,  stable from the prior study. No mass effect or midline shift. No extra-axial fluid collection. No evidence of acute infarct. No hydrocephalus. ORBITS: No acute abnormality. SINUSES AND MASTOIDS: No acute abnormality. SOFT TISSUES AND SKULL: No acute skull fracture. No acute soft tissue abnormality. IMPRESSION: 1. No acute intracranial abnormality. 2. Resolving left caudate and left lateral ventricular hemorrhage without new hemorrhage. 3. Stable periventricular and subcortical white matter changes bilaterally, moderately advanced for age. Electronically signed by: Lonni Necessary MD 09/04/2024 12:30 PM EST RP Workstation: [id number]   DG Abd 1 View Result Date: 09/03/2024 EXAM: 1 VIEW XRAY OF THE ABDOMEN 09/03/2024 03:17:00 PM COMPARISON: 12/20/2022 CLINICAL HISTORY: Constipation. FINDINGS: BOWEL: Nonobstructive bowel gas pattern. Moderate amount of stool was noted throughout the colon. SOFT TISSUES: No opaque urinary calculi. BONES: No acute osseous abnormality. IMPRESSION: 1. Moderate colonic stool burden consistent with constipation. Electronically signed by: Lynwood Seip MD 09/03/2024 03:39 PM EST RP Workstation: [id number]   DG Chest 2 View Result Date: 09/03/2024 EXAM: 2 VIEW(S) XRAY OF THE CHEST 09/03/2024 03:17:00 PM COMPARISON: Comparison 09/01/2024. CLINICAL HISTORY: 358444 Constipation [id number] Constipation FINDINGS: LUNGS AND PLEURA: No focal pulmonary opacity. No pleural effusion. No pneumothorax. HEART AND MEDIASTINUM: No acute abnormality of the cardiac and mediastinal silhouettes. BONES AND SOFT TISSUES: No acute osseous abnormality. IMPRESSION: 1. No acute cardiopulmonary process identified. Electronically signed by: Lynwood Seip MD 09/03/2024 03:37 PM EST RP Workstation: [id number]    Assessment and Plan  Concern for seizure-like activity in the setting of recent intracranial hemorrhage Patient with recent hospital admission 08/27/2024 for intracranial hemorrhage secondary to  hypertension.  She was discharged to CIR on 11/20.  Today while at [facility], code stroke was called due to patient having acute onset right-sided weakness and rigidity, decreased responsiveness, and not following commands.  CT head was showing resolving left caudate and left lateral ventricular hemorrhage without new hemorrhage.  CTA head and neck showing high-grade, near occlusive stenosis in the distal left M1 segment and proximal left P2 segment, moderate tandem stenosis in the proximal right P2 segment and proximal right P3 segment, moderate stenosis in the mid basilar artery, mild narrowing in the distal left A2 segment, and minimal atherosclerotic calcification at the right carotid bifurcation and minimal atherosclerotic changes in the proximal left ICA, both without significant stenosis.  Patient was not a candidate for IV thrombolytics due to recent  intracranial hemorrhage.  Also not a candidate for EVT due to no LVO noted on imaging per neurology.  Neurology felt that the patient's symptoms were concerning for seizure in the setting of recent intracranial hemorrhage.  She was started on LTM EEG monitoring and was given Keppra  1500 mg load and started on maintenance Keppra  500 mg twice daily.  Neurology recommended obtaining brain MRI.  Continue seizure precautions.  Hypertension SBP currently in the 140s.  Discussed with neurology and recommending keeping SBP <160 given recent intracranial hemorrhage.  Valsartan  was held due to concern for recent AKI.  Continue amlodipine .  IV labetalol  PRN.  Hyperlipidemia Continue atorvastatin  40 mg daily.  AKI on CKD stage IIIa Creatinine 1.76, improved.  Continue to hold valsartan  and avoid nephrotoxic agents.  Continue to monitor labs.  History of previous stroke/TIA No longer on antiplatelets due to recent intracranial hemorrhage.  Continue atorvastatin .  DVT prophylaxis: SCDs Code Status: Full code by default.  Unable to discuss CODE STATUS with the patient  at this time due to her altered mental status/somnolence.  No family available at this time.  She was listed as full code during her previous hospitalizations. Level of care: Telemetry bed Admission status: It is my clinical opinion that admission to INPATIENT is reasonable and necessary because of the expectation that this patient will require hospital care that crosses at least 2 midnights to treat this condition based on the medical complexity of the problems presented.  Given the aforementioned information, the predictability of an adverse outcome is felt to be significant.  Editha Ram MD [facility]  If 7PM-7AM, please contact night-coverage [email or web address]  09/04/2024, 7:36 PM

## 2024-09-04 NOTE — Progress Notes (Signed)
 Inpatient Rehabilitation Discharge Medication Review by a Pharmacist  A complete drug regimen review was completed for this patient to identify any potential clinically significant medication issues.  High Risk Drug Classes Is patient taking? Indication by Medication  Antipsychotic Yes Compazine  - N/V  Anticoagulant No   Antibiotic No   Opioid Yes Oxycodone  - pain  Antiplatelet No   Hypoglycemics/insulin No   Vasoactive Medication Yes Amlodipine  - HTN  Chemotherapy No   Other Yes Keppra  - seizures Trazodone  - sleep Lipitor - HLD Protonix  - GERD Senokot-S, Miralax  - constipation Tylenol  - pain Zinc /vitamin C , Ensure - supplement     Type of Medication Issue Identified Description of Issue Recommendation(s)  Drug Interaction(s) (clinically significant)     Duplicate Therapy     Allergy     No Medication Administration End Date     Incorrect Dose     Additional Drug Therapy Needed     Significant med changes from prior encounter (inform family/care partners about these prior to discharge).    Other  Triamcinolone  ointment, vit D Resume in acute admission or at discharge if needed    Clinically significant medication issues were identified that warrant physician communication and completion of prescribed/recommended actions by midnight of the next day:  No  Name of provider notified for urgent issues identified:   Provider Method of Notification:     Pharmacist comments:   Time spent performing this drug regimen review (minutes):  20   Rocky Slade, PharmD, BCPS 09/04/2024 2:01 PM

## 2024-09-04 NOTE — Progress Notes (Signed)
 Occupational Therapy Note  Patient Details  Name: CHANLEY MCENERY MRN: [id number] Date of Birth: [birth year]  Today's Date: 09/04/2024 OT Missed Time: 30 Minutes Missed Time Reason: Other (comment)  Discussed pt with nursing about code stroke that was called. Pt being worked up, sleeping, and inappropriate for therapy at this time. OT to follow up per plan of care.  [name] Telesa Jeancharles 09/04/2024, 2:14 PM

## 2024-09-04 NOTE — Progress Notes (Signed)
 Occupational Therapy Note  Patient Details  Name: Rhonda Cortez MRN: [id number] Date of Birth: April 09, 1961  Today's Date: 09/04/2024 OT Missed Time: 60 Minutes Missed Time Reason: Patient fatigue  OT attempted to see pt for morning session. Pt kept eyes closed and shaking her head. She stated she was too tired to get up. OT provided multiple options and opportunities for participation but she continued to decline. OT to follow up per plan of care.   [name] [name] 09/04/2024, 11:03 AM

## 2024-09-04 NOTE — Consult Note (Signed)
 NEUROLOGY CONSULT NOTE   Date of service: September 04, 2024 Patient Name: Rhonda Cortez MRN:  [id number] DOB:  [birth year] Chief Complaint: CODE STROKE Requesting Provider: Carilyn Prentice BRAVO, MD  History of Present Illness  Rhonda Cortez is a 63 y.o. female with hx of recent left caudate ICH with IVH, discharged 11/20 to CIR with NIH 2, HTN, CKD who was alerted as an Inpatient CODE STROKE due to acute onset of right sided weakness and rigidity, decreased responsiveness, and not following commands.   On exam, patient is lethargic, intermittently follows simple commands, minimal verbal output with words not matching conversation, fixed left gaze, right arm and leg weakness (leg>arm) and right leg rigidity, expressive aphasia and mild dysarthria, right neglect.   CTH and CTA negative for acute abnormality. CTH shows resolving ICH. Patient's alertness seemed to be improving somewhat while in CT scanner.   LKW: 1140 Modified rankin score: 1-No significant post stroke disability and can perform usual duties with stroke symptoms IV Thrombolysis: No, recent ICH EVT: No, no LVO   NIHSS components Score: Comment  1a Level of Conscious 0[]  1[]  2[x]  3[]      1b LOC Questions 0[x]  1[]  2[]       1c LOC Commands 0[]  1[x]  2[]       2 Best Gaze 0[]  1[]  2[x]       3 Visual 0[x]  1[]  2[]  3[]      4 Facial Palsy 0[x]  1[]  2[]  3[]      5a Motor Arm - left 0[x]  1[]  2[]  3[]  4[]  UN[]    5b Motor Arm - Right 0[]  1[x]  2[]  3[]  4[]  UN[]    6a Motor Leg - Left 0[]  1[]  2[]  3[]  4[]  UN[]    6b Motor Leg - Right 0[]  1[]  2[]  3[x]  4[]  UN[]    7 Limb Ataxia 0[]  1[x]  2[]  UN[]      8 Sensory 0[x]  1[]  2[]  UN[]      9 Best Language 0[]  1[]  2[x]  3[]      10 Dysarthria 0[]  1[x]  2[]  UN[]      11 Extinct. and Inattention 0[]  1[x]  2[]       TOTAL:  14      ROS   Unable to ascertain due to AMS  Past History   Past Medical History:  Diagnosis Date   Hypertension    Stroke [facility])     Past Surgical History:  Procedure Laterality  Date   OOPHORECTOMY     TUBAL LIGATION      Family History: Family History  Problem Relation Age of Onset   Diabetes Mother    Kidney disease Father    Hypertension Father    Hypertension Sister    Hypertension Brother    Asthma Son     Social History  reports that she has never smoked. She has never used smokeless tobacco. She reports that she does not drink alcohol and does not use drugs.  No Known Allergies  Medications   Current Facility-Administered Medications:    0.9 %  sodium chloride  infusion, , Intravenous, Continuous, Kirsteins, Prentice BRAVO, MD   0.9 %  sodium chloride  infusion, , Intravenous, Continuous, Kirsteins, Prentice BRAVO, MD   acetaminophen  (TYLENOL ) tablet 325-650 mg, 325-650 mg, Oral, Q4H PRN, Jerilynn Daphne SAILOR, NP   alum & mag hydroxide-simeth (MAALOX/MYLANTA) 200-200-20 MG/5ML suspension 30 mL, 30 mL, Oral, Q4H PRN, Jerilynn Daphne SAILOR, NP   amLODipine  (NORVASC ) tablet 10 mg, 10 mg, Oral, Daily, Jerilynn Daphne N, NP, 10 mg at 09/04/24 9278   ascorbic acid  (VITAMIN C ) tablet 1,000  mg, 1,000 mg, Oral, Daily, Jerilynn Daphne SAILOR, NP, 1,000 mg at 09/04/24 9278   atorvastatin  (LIPITOR) tablet 40 mg, 40 mg, Oral, Daily, Jerilynn Daphne SAILOR, NP, 40 mg at 09/04/24 9278   bisacodyl  (DULCOLAX) suppository 10 mg, 10 mg, Rectal, Daily PRN, Jerilynn Daphne SAILOR, NP   diphenhydrAMINE  (BENADRYL ) capsule 25 mg, 25 mg, Oral, Q6H PRN, Jerilynn Daphne SAILOR, NP   feeding supplement (ENSURE PLUS HIGH PROTEIN) liquid 237 mL, 237 mL, Oral, TID WC, Love, Pamela S, PA-C, 237 mL at 09/04/24 0544   guaiFENesin -dextromethorphan (ROBITUSSIN DM) 100-10 MG/5ML syrup 5-10 mL, 5-10 mL, Oral, Q6H PRN, Jerilynn Daphne SAILOR, NP   Oral care mouth rinse, 15 mL, Mouth Rinse, PRN, Jerilynn Daphne SAILOR, NP   oxyCODONE  (Oxy IR/ROXICODONE ) immediate release tablet 5 mg, 5 mg, Oral, Q6H PRN, Jerilynn Daphne SAILOR, NP, 5 mg at 09/03/24 1745   pantoprazole  (PROTONIX ) EC tablet 40 mg, 40 mg, Oral, QHS, Jerilynn Daphne SAILOR, NP, 40 mg at 09/03/24 2053   polyethylene glycol (MIRALAX  / GLYCOLAX ) packet 17 g, 17 g, Oral, Daily, Jerilynn Daphne SAILOR, NP, 17 g at 09/03/24 1740   prochlorperazine  (COMPAZINE ) tablet 5-10 mg, 5-10 mg, Oral, Q6H PRN **OR** prochlorperazine  (COMPAZINE ) suppository 12.5 mg, 12.5 mg, Rectal, Q6H PRN **OR** prochlorperazine  (COMPAZINE ) injection 5-10 mg, 5-10 mg, Intravenous, Q6H PRN, Jerilynn Daphne SAILOR, NP   senna-docusate (Senokot-S) tablet 1 tablet, 1 tablet, Oral, BID, Jerilynn Daphne SAILOR, NP, 1 tablet at 09/04/24 0721   sodium phosphate (FLEET) enema 1 enema, 1 enema, Rectal, Once PRN, Jerilynn Daphne SAILOR, NP   traZODone  (DESYREL ) tablet 50 mg, 50 mg, Oral, QHS, Kirsteins, Prentice BRAVO, MD   zinc  sulfate (50mg  elemental zinc ) capsule 220 mg, 220 mg, Oral, Q supper, Jerilynn Daphne N, NP, 220 mg at 09/03/24 1740  Vitals   Vitals:   09/03/24 2018 09/04/24 0541 09/04/24 0606 09/04/24 1201  BP: (!) 148/88 (!) 136/95  112/78  Pulse: 95 (!) 110  96  Resp: 16 16  16   Temp: 98.6 F (37 C) (!) 100.4 F (38 C)  99 F (37.2 C)  TempSrc: Oral Oral    SpO2: 99% 98%  97%  Weight:   58.8 kg   Height:        Body mass index is 22.96 kg/m.   Physical Exam   Constitutional: Appears well-developed and well-nourished.  Cardiovascular: Normal rate and regular rhythm.  Respiratory: Effort normal, non-labored breathing.   Neurologic Examination   Neuro: Mental Status: Patient is lethargic.  Minimal verbal output.  Intermittently follows simple commands. Expressive aphasia present, patient answers with nonsensical words. Cranial Nerves: II: Blinks to threat bilaterally. Pupils are equal, round, and reactive to light.   III,IV, VI: EOMI without ptosis or diploplia.  V: Facial sensation is symmetric to light touch VII: Facial movement is symmetric.  VIII: hearing is intact to voice X: Uvula elevates symmetrically XI: Shoulder shrug is symmetric. XII: tongue is midline without atrophy or  fasciculations.  Motor: Tone is normal. Bulk is normal. RUE: slight drift, decreased coordination RLE: Increased tone, drift present.  Sensory: Sensation is symmetric to light touch in the arms and legs. Cerebellar: Unable to perform   Labs/Imaging/Neurodiagnostic studies   CBC:  Recent Labs  Lab Sep 27, 2024 0406 09/03/24 0544  WBC 12.2* 13.8*  NEUTROABS  --  8.7*  HGB 14.6 14.1  HCT 42.5 40.4  MCV 85.5 86.0  PLT 294 319   Basic Metabolic Panel:  Lab Results  Component Value Date  NA 134 (L) 09/03/2024   K 4.0 09/03/2024   CO2 22 09/03/2024   GLUCOSE 128 (H) 09/03/2024   BUN 29 (H) 09/03/2024   CREATININE 1.76 (H) 09/03/2024   CALCIUM  9.2 09/03/2024   GFRNONAA 32 (L) 09/03/2024   Lipid Panel:  Lab Results  Component Value Date   LDLCALC 164 (H) 08/29/2024   HgbA1c:  Lab Results  Component Value Date   HGBA1C 5.8 (H) 08/28/2024   Urine Drug Screen:     Component Value Date/Time   LABOPIA NONE DETECTED 08/27/2024 1309   COCAINSCRNUR NONE DETECTED 08/27/2024 1309   LABBENZ NONE DETECTED 08/27/2024 1309   AMPHETMU NONE DETECTED 08/27/2024 1309   THCU NONE DETECTED 08/27/2024 1309   LABBARB NONE DETECTED 08/27/2024 1309    Alcohol Level     Component Value Date/Time   ETH <15 08/27/2024 1222   INR  Lab Results  Component Value Date   INR 1.0 08/27/2024   APTT  Lab Results  Component Value Date   APTT 29 08/27/2024   AED levels: No results found for: PHENYTOIN, ZONISAMIDE, LAMOTRIGINE, LEVETIRACETA  CT Head without contrast(Personally reviewed): No acute intracranial abnormality. Resolving left caudate and left lateral ventricular hemorrhage without new hemorrhage. Stable periventricular and subcortical white matter changes bilaterally, moderately advanced for age.   ASSESSMENT   Rhonda Cortez is a 63 y.o. female with history of CKD and hypertension who presents with confusion and found to have a small head of the caudate hemorrhage.  Discharged to CIR 11/20. NIH on Admission 1, on discharge: 2.  Inpatient CODE STROKE called due to patient becoming unresponsive and not following commands.    On exam, patient is lethargic, intermittently follows simple commands, minimal verbal output with words not matching conversation, fixed left gaze, right arm and leg weakness (leg>arm) and right leg rigidity, expressive aphasia and mild dysarthria, right neglect. Symptoms improving with subsequent exams.   CTH and CTA negative for acute abnormality. CTH shows resolving ICH.   Concern for seizure due to known risk secondary to recent ICH and exam.  RECOMMENDATIONS   - LTM EEG - Keppra  load - Keppra  maintenance 500mg  BID - MRI Brain - Will continue to follow ______________________________________________________________________    Signed, Rocky JAYSON Likes, NP   Attending Neurohospitalist Addendum Patient seen and examined with APP/Resident. Agree with the history and physical as documented above. Agree with the plan as documented, which I helped formulate. I have edited the note above to reflect my full findings and recommendations. I have independently reviewed the chart, obtained history, review of systems and examined the patient.I have personally reviewed pertinent head/neck/spine imaging (CT/MRI). Please feel free to call with any questions.  -- Elida Ross, MD [facility] [phone]  If 7pm- 7am, please page neurology on call as listed in AMION.    [facility]

## 2024-09-04 NOTE — Progress Notes (Addendum)
 Physical Therapy Session Note  Patient Details  Name: Rhonda Cortez MRN: [id number] Date of Birth: 09-Mar-1961  Today's Date: 09/04/2024     Short Term Goals: Week 1:  PT Short Term Goal 1 (Week 1): = LTGs  Pt missed 60 min of skilled therapy due to reports of uncomfortability/pain in stomach. NSG aware and provided pt with stool softener prior to arrival. Pt reported inability to participate in therapy due to this pain. Will re-attempt as schedule and pt availability permits.       Therapy Documentation Precautions:  Precautions Precautions: Fall Restrictions Weight Bearing Restrictions Per Provider Order: No   Therapy/Group: Individual Therapy  Colbey Wirtanen PTA 09/04/2024, 8:02 AM

## 2024-09-04 NOTE — Progress Notes (Signed)
 LTM VIDEO EEG hooked up and running - no initial skin breakdown - push button tested - [facility] is monitoring.

## 2024-09-04 NOTE — Progress Notes (Signed)
 Plan of Care Note for accepted transfer   Patient: Rhonda Cortez MRN: [id number]   DOA: (Not on file)  Facility requesting transfer: Inpatient Rehab Requesting Provider: Carilyn Prentice BRAVO, MD  Reason for transfer: Seizure-like activity Facility course:   Patient admitted for ICH.  Discharged to inpatient rehab.  Today with PT patient ambulated to the bathroom but while sitting on the toilet became rigid with legs extended.  Able to get patient back to the bed with help of patient's sons.  Upon MD evaluation patient was alert and following simple commands but not at baseline.  Neurology consulted and CT performed which was stable.  Neurology recommended transfer to acute care for 24-hour EEG.  Keppra  started.  Plan of care: The patient is accepted for admission to Telemetry unit, at [facility].  Author: Marsa KATHEE Scurry, MD 09/04/2024  Check [email or web address] for on-call coverage.  Nursing staff, Please call [facility] Admits & Consults System-Wide number on Amion as soon as patient's arrival, so appropriate admitting provider can evaluate the pt.

## 2024-09-04 NOTE — Progress Notes (Signed)
 Called by Rehab LPN.  Pt ambulated to bathroom with only supervision but while sitting on toilet became rigid with legs in ext.  LPN was able to get pt back in bed with help of pt's son.  When I arrived pt was alert following simple commands (move hands move toes) but family reports pt was not talking to them .   Spoke to Dr Matthews who indicated CT head is ok but wants pt to have 24h EEG monitoring for suspected seizure  Will contact [facility]

## 2024-09-05 ENCOUNTER — Inpatient Hospital Stay (HOSPITAL_COMMUNITY)

## 2024-09-05 DIAGNOSIS — Z8679 Personal history of other diseases of the circulatory system: Secondary | ICD-10-CM

## 2024-09-05 LAB — CBC
HCT: 41.5 % (ref 36.0–46.0)
Hemoglobin: 14.2 g/dL (ref 12.0–15.0)
MCH: 29.8 pg (ref 26.0–34.0)
MCHC: 34.2 g/dL (ref 30.0–36.0)
MCV: 87.2 fL (ref 80.0–100.0)
Platelets: 301 K/uL (ref 150–400)
RBC: 4.76 MIL/uL (ref 3.87–5.11)
RDW: 11.9 % (ref 11.5–15.5)
WBC: 13 K/uL — ABNORMAL HIGH (ref 4.0–10.5)
nRBC: 0 % (ref 0.0–0.2)

## 2024-09-05 LAB — BASIC METABOLIC PANEL WITH GFR
Anion gap: 14 (ref 5–15)
BUN: 30 mg/dL — ABNORMAL HIGH (ref 8–23)
CO2: 21 mmol/L — ABNORMAL LOW (ref 22–32)
Calcium: 9.2 mg/dL (ref 8.9–10.3)
Chloride: 101 mmol/L (ref 98–111)
Creatinine, Ser: 1.81 mg/dL — ABNORMAL HIGH (ref 0.44–1.00)
GFR, Estimated: 31 mL/min — ABNORMAL LOW (ref >60)
Glucose, Bld: 109 mg/dL — ABNORMAL HIGH (ref 70–99)
Potassium: 3.8 mmol/L (ref 3.5–5.1)
Sodium: 136 mmol/L (ref 135–145)

## 2024-09-05 MED ORDER — HYDRALAZINE HCL 20 MG/ML IJ SOLN: 10.0000 mg | INTRAMUSCULAR | Status: DC | PRN

## 2024-09-05 MED ORDER — SODIUM CHLORIDE 0.9 % IV SOLN
200.0000 mg | Freq: Once | INTRAVENOUS | Status: AC
  Administered 2024-09-05: 200 mg via INTRAVENOUS
  Filled 2024-09-05: qty 20

## 2024-09-05 MED ORDER — LACOSAMIDE 50 MG PO TABS
50.0000 mg | ORAL_TABLET | Freq: Two times a day (BID) | ORAL | Status: DC
  Administered 2024-09-05 – 2024-09-06 (×2): 50 mg via ORAL
  Filled 2024-09-05 (×3): qty 1

## 2024-09-05 MED ORDER — LABETALOL HCL 5 MG/ML IV SOLN
10.0000 mg | INTRAVENOUS | Status: DC | PRN
  Administered 2024-09-06: 10 mg via INTRAVENOUS
  Filled 2024-09-05: qty 4

## 2024-09-05 NOTE — Progress Notes (Signed)
 Neurology progress note  S: Patient slightly lethargic today but fully oriented, following commands. No new neurologic complaints  O:  Vitals:   09/05/24 0339 09/05/24 1601  BP: 139/88 (!) 142/83  Pulse: 84 98  Resp: 17 18  Temp: 98.9 F (37.2 C) (!) 97.3 F (36.3 C)  SpO2: 100% 100%   Gen: lying in bed, NAD CV: RRR Resp: CTAB  MS: slighyl lethargic but fully oriented, follows commands Speech: mildly dysarthric CN: PERRL, EOMI, R lower facial droop, hearing intact to voice Motor: RUE drift but not to bed, RLE drift to bed, LUE and LLE no drift Sensory: SILT Coordination: no ataxia on FNF bilat Gait: deferred  LTM EEG: continuous slowing L temporal region, no seizures  A/P: 63 yo woman with hx CKD and HTN admitted for small L caudate IPH and subsequently discharged to CIR. Stroke code was called yesterday for acute onset poor responsiveness favored to be seizure. She is significantly improved today and had no seizures on EEG overnight. She has difficulty swallowing pills therefore will change her from keppra  to vimpat  which are easier tablets to swallow. She will need to stay on AED going forward 2/2 high risk of seizure recurrence given hx ICH.  - D/c LTM - Stop keppra  - Vimpat  200mg  load f/b 50mg  bid, continue indefinitely - OK to discharge to CIR in AM if she is back to baseline - No further inpatient neurologic workup or treatment indicated at this time - Follow up with outpatient neurology as scheduled  Neurology will be available prn for questions going forward.  Elida Ross, MD [facility] [phone]  If 7pm- 7am, please page neurology on call as listed in AMION.

## 2024-09-05 NOTE — Assessment & Plan Note (Addendum)
-   Initially presented on 08/27/2024 with left caudate intracranial hemorrhage with intraventricular extension - Improved slowly and discharged to CIR on 11/20.  See seizure workup above - Follow-up PT/OT recommendations and CIR reevaluation

## 2024-09-05 NOTE — Progress Notes (Signed)
 Progress Note    Rhonda Cortez   FMW:[id number]  DOB: 02-Jan-1961  DOA: 09/04/2024     1 PCP: Lucius Krabbe, NP  Initial CC: Right sided rigidity/weakness and decreased level of responsiveness  Hospital Course: Rhonda Cortez is a 63 y.o. 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  Bleed was stable with serial scans and she was discharged to CIR on 09/02/2024.  Main symptoms on that admission were confusion and aphasia.  She was doing well in CIR then on 11/22 she developed right sided weakness/rigidity, decreased level of responsiveness and not following commands. Repeat CT head showed no acute abnormalities and resolving known prior ICH.  Given her decreased responsiveness/level of alertness and right sided rigidity, concern was for seizure in relation to underlying recent ICH. She was loaded with Keppra  and admitted back to the hospital for further workup and neurology evaluation.  Interval History:  No events overnight.  She declined last night's dose of Keppra .  Loading dose was given during seizure activity yesterday.  Importance of ongoing Keppra  doses explained to patient and her sons bedside this morning. Currently on LTM EEG and is alert and oriented x 3.  Assessment and Plan: * Seizure (HCC) - While in rehab after recent ICH, patient developed right sided weakness/rigidity, decreased responsiveness, not following commands.  Concern was for development of seizure in setting of recent ICH with intraventricular extension - No worsening of bleed noted on repeat CT head on 11/22 - Loaded with Keppra  on 11/22 and continued on Keppra  500 mg twice daily per neurology - On LTM EEG per neurology - Continue seizure precautions - Eventually needs repeat evaluation with PT/OT to determine if able to get back to CIR  History of intracranial  hemorrhage - Initially presented on 08/27/2024 with left caudate intracranial hemorrhage with intraventricular extension - Improved slowly and discharged to CIR on 11/20.  See seizure workup above - Continue neurochecks -Eventually needs repeat PT/OT evaluation  Chronic kidney disease, stage 3b (HCC) - patient has history of CKD3b. Baseline creat ~ 1.8 - 2, eGFR~ 30-35   Mixed hyperlipidemia - Last LDL 164 on 08/29/2024 - Continue Lipitor  History of stroke - Continue Lipitor  Essential (primary) hypertension - SBP goal less than 160 - continue norvasc  - PRN labetalol  and hydralazine     Antimicrobials: None  DVT prophylaxis:  SCDs Start: 09/04/24 2046   Code Status:   Code Status: Full Code  Mobility Assessment (Last 72 Hours)     Mobility Assessment     Row Name 09/05/24 1100 09/05/24 0800 09/05/24 0345 09/05/24 0015 09/04/24 2000   Does the patient have exclusion criteria? -- No - Perform mobility assessment No - Perform mobility assessment No - Perform mobility assessment No - Perform mobility assessment   What is the highest level of mobility based on the mobility assessment? -- Level 2 (Chairfast) - Balance while sitting on edge of bed and cannot stand Level 4 (Ambulates with assistance) - Balance while stepping forward/back - Complete Level 4 (Ambulates with assistance) - Balance while stepping forward/back - Complete Level 4 (Ambulates with assistance) - Balance while stepping forward/back - Complete   Is the above level different from baseline mobility prior to current illness? -- Yes - Recommend PT order -- -- --      Diet: Diet Orders (From admission, onward)     Start  Ordered   09/04/24 2047  Diet Heart Room service appropriate? Yes; Fluid consistency: Thin  Diet effective now       Question Answer Comment  Room service appropriate? Yes   Fluid consistency: Thin      09/04/24 2053            Barriers to discharge: None Disposition Plan:  Possibly CIR again HH orders placed: TBD Status is: Inpatient  Objective: Blood pressure 139/88, pulse 84, temperature 98.9 F (37.2 C), resp. rate 17, SpO2 100%.  Examination:  Physical Exam Constitutional:      Comments: Lethargic appearing but awakens easily and able to answer most questions and follow commands easily  HENT:     Head: Normocephalic and atraumatic.     Mouth/Throat:     Mouth: Mucous membranes are moist.  Eyes:     Extraocular Movements: Extraocular movements intact.  Cardiovascular:     Rate and Rhythm: Normal rate and regular rhythm.  Pulmonary:     Effort: Pulmonary effort is normal. No respiratory distress.     Breath sounds: Normal breath sounds. No wheezing.  Abdominal:     General: Bowel sounds are normal. There is no distension.     Palpations: Abdomen is soft.     Tenderness: There is no abdominal tenderness.  Musculoskeletal:        General: Normal range of motion.     Cervical back: Normal range of motion and neck supple.  Skin:    General: Skin is warm and dry.  Neurological:     Comments: Alert and oriented x 3.  No obvious focal deficits.  Slowed mentation  Psychiatric:        Mood and Affect: Mood normal.      Consultants:  Neurology  Procedures:    Data Reviewed: Results for orders placed or performed during the hospital encounter of 09/04/24 (from the past 24 hours)  CBC     Status: Abnormal   Collection Time: 09/05/24  4:31 AM  Result Value Ref Range   WBC 13.0 (H) 4.0 - 10.5 K/uL   RBC 4.76 3.87 - 5.11 MIL/uL   Hemoglobin 14.2 12.0 - 15.0 g/dL   HCT 58.4 63.9 - 53.9 %   MCV 87.2 80.0 - 100.0 fL   MCH 29.8 26.0 - 34.0 pg   MCHC 34.2 30.0 - 36.0 g/dL   RDW 88.0 88.4 - 84.4 %   Platelets 301 150 - 400 K/uL   nRBC 0.0 0.0 - 0.2 %  Basic metabolic panel     Status: Abnormal   Collection Time: 09/05/24  4:31 AM  Result Value Ref Range   Sodium 136 135 - 145 mmol/L   Potassium 3.8 3.5 - 5.1 mmol/L   Chloride 101 98 - 111  mmol/L   CO2 21 (L) 22 - 32 mmol/L   Glucose, Bld 109 (H) 70 - 99 mg/dL   BUN 30 (H) 8 - 23 mg/dL   Creatinine, Ser 8.18 (H) 0.44 - 1.00 mg/dL   Calcium  9.2 8.9 - 10.3 mg/dL   GFR, Estimated 31 (L) >60 mL/min   Anion gap 14 5 - 15    I have reviewed pertinent nursing notes, vitals, labs, and images as necessary. I have ordered labwork to follow up on as indicated.  I have reviewed the last notes from staff over past 24 hours. I have discussed patient's care plan and test results with nursing staff, CM/SW, and other staff as appropriate.  Old records reviewed  in assessment of this patient  Time spent: Greater than 50% of the 55 minute visit was spent in counseling/coordination of care for the patient as laid out in the A&P.   LOS: 1 day   Alm Apo, MD [facility] 09/05/2024, 12:19 PM

## 2024-09-05 NOTE — Assessment & Plan Note (Signed)
-   While in rehab after recent ICH, patient developed right sided weakness/rigidity, decreased responsiveness, not following commands.  Concern was for development of seizure in setting of recent ICH with intraventricular extension - No worsening of bleed noted on repeat CT head on 11/22 - Loaded with Keppra  on 11/22 and continued on Keppra  500 mg twice daily per neurology; transitioned to Vimpat  on 09/05/2024 per neurology. -No further EEG monitoring needed or recommended per neurology - tentative plan for d/c back to rehab if accepted/approved; PT/OT consulted

## 2024-09-05 NOTE — Assessment & Plan Note (Signed)
-   patient has history of CKD3b. Baseline creat ~ 1.8 - 2, eGFR~ 30-35

## 2024-09-05 NOTE — Procedures (Signed)
 Patient Name: Rhonda Cortez  MRN: [id number]  Epilepsy Attending: Arlin MALVA Krebs  Referring Physician/Provider: Judithe Rocky BROCKS, NP  Duration: 09/04/2024 1557 to 09/05/2024 1234  Patient history: 63 y.o. female who presents with confusion and found to have a small head of the caudate hemorrhage. Again patient becoming unresponsive and not following commands. EEG to evaluate for seizure  Level of alertness: Awake, asleep  AEDs during EEG study: LEV  Technical aspects: This EEG study was done with scalp electrodes positioned according to the 10-20 International system of electrode placement. Electrical activity was reviewed with band pass filter of 1-70Hz , sensitivity of 7 uV/mm, display speed of 37mm/sec with a 60Hz  notched filter applied as appropriate. EEG data were recorded continuously and digitally stored.  Video monitoring was available and reviewed as appropriate.  Description: The posterior dominant rhythm consists of 8-9 Hz activity of moderate voltage (25-35 uV) seen predominantly in posterior head regions, symmetric and reactive to eye opening and eye closing. Sleep was characterized by vertex waves, sleep spindles (12 to 14 Hz), maximal frontocentral region. There is continuous 3 to 6 Hz theta-delta slowing in left temporal region. Hyperventilation and photic stimulation were not performed.     ABNORMALITY - Continuous slow, left temporal  IMPRESSION: This study is suggestive of cortical dysfunction arising from left temporal region likely secondary to underlying structural abnormality. No seizures or epileptiform discharges were seen throughout the recording.  [name]

## 2024-09-05 NOTE — Plan of Care (Signed)
 Confused but able to follow all the commands, frequently reorientation done. Son was with her all the time and other visitors her close relatives came to visit her during the day time.  Problem: Education: Goal: Knowledge of secondary prevention will improve (MUST DOCUMENT ALL) Outcome: Progressing   Problem: Education: Goal: Knowledge of patient specific risk factors will improve (DELETE if not current risk factor) Outcome: Progressing   Problem: Education: Goal: Knowledge of disease or condition will improve Outcome: Progressing   Problem: Ischemic Stroke/TIA Tissue Perfusion: Goal: Complications of ischemic stroke/TIA will be minimized Outcome: Progressing   Problem: Skin Integrity: Goal: Risk for impaired skin integrity will decrease 09/05/2024 1836 by Siri Soja, RN Outcome: Progressing 09/05/2024 1835 by Siri Soja, RN Outcome: Progressing   Problem: Safety: Goal: Ability to remain free from injury will improve 09/05/2024 1836 by Siri Soja, RN Outcome: Progressing 09/05/2024 1835 by Siri Soja, RN Outcome: Progressing   Problem: Elimination: Goal: Will not experience complications related to bowel motility 09/05/2024 1836 by Siri Soja, RN Outcome: Progressing 09/05/2024 1835 by Siri Soja, RN Outcome: Progressing   Problem: Elimination: Goal: Will not experience complications related to urinary retention 09/05/2024 1836 by Siri Soja, RN Outcome: Progressing 09/05/2024 1835 by Siri Soja, RN Outcome: Progressing

## 2024-09-05 NOTE — Progress Notes (Signed)
 EEG LTM D/C'd. No noted skin break down. [facility] notified.

## 2024-09-05 NOTE — Assessment & Plan Note (Signed)
-   SBP goal less than 160 - continue norvasc  - PRN labetalol  and hydralazine   - Orthostatic blood pressures checked on 11/25 and does have 20 pt drop in SBP

## 2024-09-05 NOTE — Hospital Course (Signed)
 Rhonda Cortez is a 63 y.o. 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  Bleed was stable with serial scans and she was discharged to [facility] on 09/02/2024.  Main symptoms on that admission were confusion and aphasia.  She was doing well in CIR then on 11/22 she developed right sided weakness/rigidity, decreased level of responsiveness and not following commands. Repeat CT head showed no acute abnormalities and resolving known prior ICH.  Given her decreased responsiveness/level of alertness and right sided rigidity, concern was for seizure in relation to underlying recent ICH. She was loaded with Keppra  and admitted back to the hospital for further workup and neurology evaluation. Workup was consistent with seizure associated from prior ICH.  She was transitioned from Keppra  to Vimpat  per neurology.

## 2024-09-05 NOTE — Assessment & Plan Note (Signed)
 -  Continue Lipitor

## 2024-09-05 NOTE — Assessment & Plan Note (Signed)
-   Last LDL 164 on 08/29/2024 - Continue Lipitor

## 2024-09-06 ENCOUNTER — Inpatient Hospital Stay (HOSPITAL_COMMUNITY)

## 2024-09-06 DIAGNOSIS — I129 Hypertensive chronic kidney disease with stage 1 through stage 4 chronic kidney disease, or unspecified chronic kidney disease: Secondary | ICD-10-CM

## 2024-09-06 DIAGNOSIS — G936 Cerebral edema: Secondary | ICD-10-CM

## 2024-09-06 DIAGNOSIS — I63532 Cerebral infarction due to unspecified occlusion or stenosis of left posterior cerebral artery: Secondary | ICD-10-CM

## 2024-09-06 DIAGNOSIS — I951 Orthostatic hypotension: Secondary | ICD-10-CM

## 2024-09-06 DIAGNOSIS — I639 Cerebral infarction, unspecified: Principal | ICD-10-CM

## 2024-09-06 DIAGNOSIS — N1832 Chronic kidney disease, stage 3b: Secondary | ICD-10-CM

## 2024-09-06 DIAGNOSIS — E785 Hyperlipidemia, unspecified: Secondary | ICD-10-CM

## 2024-09-06 DIAGNOSIS — I779 Disorder of arteries and arterioles, unspecified: Secondary | ICD-10-CM

## 2024-09-06 LAB — CULTURE, BLOOD (ROUTINE X 2)
Culture: NO GROWTH
Culture: NO GROWTH
Special Requests: ADEQUATE

## 2024-09-06 MED ORDER — ACETAMINOPHEN 325 MG PO TABS
650.0000 mg | ORAL_TABLET | ORAL | Status: DC | PRN
  Administered 2024-09-08 (×2): 650 mg via ORAL
  Filled 2024-09-06 (×3): qty 2

## 2024-09-06 MED ORDER — SODIUM CHLORIDE 0.9 % IV SOLN
50.0000 mg | Freq: Two times a day (BID) | INTRAVENOUS | Status: DC
  Administered 2024-09-06: 50 mg via INTRAVENOUS
  Filled 2024-09-06 (×3): qty 5

## 2024-09-06 MED ORDER — ASPIRIN 81 MG PO TBEC
81.0000 mg | DELAYED_RELEASE_TABLET | Freq: Every day | ORAL | Status: DC
  Administered 2024-09-07 – 2024-09-08 (×2): 81 mg via ORAL
  Filled 2024-09-06 (×2): qty 1

## 2024-09-06 MED ORDER — HEPARIN SODIUM (PORCINE) 5000 UNIT/ML IJ SOLN
5000.0000 [IU] | Freq: Three times a day (TID) | INTRAMUSCULAR | Status: DC
  Administered 2024-09-06 – 2024-09-08 (×6): 5000 [IU] via SUBCUTANEOUS
  Filled 2024-09-06 (×6): qty 1

## 2024-09-06 MED ORDER — ACETAMINOPHEN 650 MG RE SUPP: 650.0000 mg | Freq: Four times a day (QID) | RECTAL | Status: DC | PRN

## 2024-09-06 NOTE — Discharge Summary (Addendum)
 Physician Discharge Summary  Patient ID: Rhonda Cortez MRN: [id number] DOB/AGE: 06/08/1961 63 y.o.  Admit date: 09/02/2024 Discharge date: 09/04/2024  Discharge Diagnoses:  Principal Problem:   ICH (intracerebral hemorrhage) (HCC) Active Problems:   History of stroke   Constipation   Mixed hyperlipidemia   Seizure (HCC)   Chronic kidney disease, stage 3b (HCC)   Discharged Condition: poor  Significant Diagnostic Studies: Overnight EEG with video Result Date: 09/04/2024 Shelton Arlin KIDD, MD     09/06/2024  8:20 AM Patient Name: Rhonda Cortez MRN: [id number] Epilepsy Attending: Arlin KIDD Shelton Referring Physician/Provider: Judithe Rocky BROCKS, NP Duration: 09/04/2024 1557 to 09/05/2024 1234 Patient history: 63 y.o. female who presents with confusion and found to have a small head of the caudate hemorrhage. Again patient becoming unresponsive and not following commands. EEG to evaluate for seizure Level of alertness: Awake, asleep AEDs during EEG study: LEV Technical aspects: This EEG study was done with scalp electrodes positioned according to the 10-20 International system of electrode placement. Electrical activity was reviewed with band pass filter of 1-70Hz , sensitivity of 7 uV/mm, display speed of 47mm/sec with a 60Hz  notched filter applied as appropriate. EEG data were recorded continuously and digitally stored.  Video monitoring was available and reviewed as appropriate. Description: The posterior dominant rhythm consists of 8-9 Hz activity of moderate voltage (25-35 uV) seen predominantly in posterior head regions, symmetric and reactive to eye opening and eye closing. Sleep was characterized by vertex waves, sleep spindles (12 to 14 Hz), maximal frontocentral region. There is continuous 3 to 6 Hz theta-delta slowing in left temporal region. Hyperventilation and photic stimulation were not performed.   ABNORMALITY - Continuous slow, left temporal IMPRESSION: This study is suggestive of cortical  dysfunction arising from left temporal region likely secondary to underlying structural abnormality. No seizures or epileptiform discharges were seen throughout the recording. Rhonda Cortez   CT ANGIO HEAD NECK W WO CM (CODE STROKE) Result Date: 09/04/2024 EXAM: CTA Head and Neck with Intravenous Contrast. CT Head without Contrast. CLINICAL HISTORY: Neuro deficit, acute, stroke suspected. TECHNIQUE: Axial CTA images of the head and neck performed with and without intravenous contrast. MIP reconstructed images were created and reviewed. Axial computed tomography images of the head/brain performed without intravenous contrast. Note: Per PQRS, the description of internal carotid artery percent stenosis, including 0 percent or normal exam, is based on [employer] Symptomatic Carotid Endarterectomy Trial (NASCET) criteria. Dose reduction technique was used including one or more of the following: automated exposure control, adjustment of mA and kV according to patient size, and/or iterative reconstruction. CONTRAST: Without and with; 75 mL (iohexol  (OMNIPAQUE ) 350 MG/ML injection 75 mL IOHEXOL  350 MG/ML SOLN). COMPARISON: MR head without contrast and MRA head without contrast 08/29/2024. FINDINGS: CT HEAD: BRAIN: No acute intraparenchymal hemorrhage. No mass lesion. No CT evidence for acute territorial infarct. No midline shift or extra-axial collection. VENTRICLES: No hydrocephalus. ORBITS: The orbits are unremarkable. SINUSES AND MASTOIDS: The paranasal sinuses and mastoid air cells are clear. CTA NECK: COMMON CAROTID ARTERIES: No significant stenosis. No dissection or occlusion. INTERNAL CAROTID ARTERIES: Minimal atherosclerotic calcification was present at the right carotid bifurcation without significant stenosis. Minimal atherosclerotic changes are present in the proximal left ICA without significant stenosis. No dissection or occlusion. VERTEBRAL ARTERIES: The left vertebral artery is slightly dominant to  the right. No significant stenosis. No dissection or occlusion. CTA HEAD: ANTERIOR CEREBRAL ARTERIES: Mild narrowing is present in the distal left A2 segment. No occlusion. No  aneurysm. MIDDLE CEREBRAL ARTERIES: High-grade near occlusive stenosis is present in the distal left M1 segment. Asymmetric attenuation of left MCA branch vessels is noted. No aneurysm. POSTERIOR CEREBRAL ARTERIES: A high-grade, near occlusive stenosis is present in the proximal left P2 segment. Moderate tandem stenoses are present in the proximal right P2 segment and proximal right P3 segment. Distal PCA branch vessels are visualized on the right. No aneurysm. BASILAR ARTERY: Moderate stenosis is present in the mid basilar artery. No occlusion. No aneurysm. OTHER: SOFT TISSUES: No acute finding. No masses or lymphadenopathy. BONES: No acute osseous abnormality. IMPRESSION: 1. High-grade, near occlusive stenosis in the distal left M1 segment and proximal left P2 segment. 2. Moderate tandem stenoses in the proximal right P2 segment and proximal right P3 segment. 3. 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 4. Mild narrowing in the distal left A2 segment. 5. Minimal atherosclerotic calcification at the right carotid bifurcation and minimal atherosclerotic changes in the proximal left ICA, both without significant stenosis. Electronically signed by: Lonni Necessary MD 09/04/2024 12:37 PM EST RP Workstation: [id number]   CT HEAD CODE STROKE WO CONTRAST` Result Date: 09/04/2024 EXAM: CT HEAD WITHOUT 09/04/2024 12:20:07 PM TECHNIQUE: CT of the head was performed without the administration of intravenous contrast. Automated exposure control, iterative reconstruction, and/or weight based adjustment of the mA/kV was utilized to reduce the radiation dose to as low as reasonably achievable. COMPARISON: CT head without contrast 08/28/2024 and MR head without contrast 08/29/2024. CLINICAL HISTORY: Seizure, new-onset, no history of trauma;  Stroke, follow up. Legs became rigid while sitting on the toilet. Symptoms have since resolved. FINDINGS: BRAIN AND VENTRICLES: Focal hemorrhage in the left caudate and left lateral ventricle is less prominent than on the prior studies. No new hemorrhage is present. Periventricular and subcortical white matter changes bilaterally are moderately advanced for age, stable from the prior study. No mass effect or midline shift. No extra-axial fluid collection. No evidence of acute infarct. No hydrocephalus. ORBITS: No acute abnormality. SINUSES AND MASTOIDS: No acute abnormality. SOFT TISSUES AND SKULL: No acute skull fracture. No acute soft tissue abnormality. IMPRESSION: 1. No acute intracranial abnormality. 2. Resolving left caudate and left lateral ventricular hemorrhage without new hemorrhage. 3. Stable periventricular and subcortical white matter changes bilaterally, moderately advanced for age. Electronically signed by: Lonni Necessary MD 09/04/2024 12:30 PM EST RP Workstation: [id number]   DG Abd 1 View Result Date: 09/03/2024 EXAM: 1 VIEW XRAY OF THE ABDOMEN 09/03/2024 03:17:00 PM COMPARISON: 12/20/2022 CLINICAL HISTORY: Constipation. FINDINGS: BOWEL: Nonobstructive bowel gas pattern. Moderate amount of stool was noted throughout the colon. SOFT TISSUES: No opaque urinary calculi. BONES: No acute osseous abnormality. IMPRESSION: 1. Moderate colonic stool burden consistent with constipation. Electronically signed by: Lynwood Seip MD 09/03/2024 03:39 PM EST RP Workstation: [id number]   DG Chest 2 View Result Date: 09/03/2024 EXAM: 2 VIEW(S) XRAY OF THE CHEST 09/03/2024 03:17:00 PM COMPARISON: Comparison 09/01/2024. CLINICAL HISTORY: 358444 Constipation [id number] Constipation FINDINGS: LUNGS AND PLEURA: No focal pulmonary opacity. No pleural effusion. No pneumothorax. HEART AND MEDIASTINUM: No acute abnormality of the cardiac and mediastinal silhouettes. BONES AND SOFT TISSUES: No acute osseous abnormality.  IMPRESSION: 1. No acute cardiopulmonary process identified. Electronically signed by: Lynwood Seip MD 09/03/2024 03:37 PM EST RP Workstation: [id number]   DG CHEST PORT 1 VIEW Result Date: 09/01/2024 EXAM: 1 VIEW XRAY OF THE CHEST 09/01/2024 10:03:48 AM COMPARISON: None available. CLINICAL HISTORY: Fever FINDINGS: LUNGS AND PLEURA: No focal pulmonary opacity. No pleural effusion. No pneumothorax. HEART AND MEDIASTINUM:  No acute abnormality of the cardiac and mediastinal silhouettes. BONES AND SOFT TISSUES: No acute osseous abnormality. IMPRESSION: 1. No acute cardiopulmonary process identified. Electronically signed by: Waddell Calk MD 09/01/2024 01:37 PM EST RP Workstation: [id number]   VAS US  CAROTID Result Date: 08/30/2024 Carotid Arterial Duplex Study Patient Name:  BRUCHY MIKEL  Date of Exam:   08/30/2024 Medical Rec #: [id number]    Accession #:    [id number] Date of Birth: June 14, 1961    Patient Gender: F Patient Age:   50 years Exam Location:  [facility] Procedure:      VAS US  CAROTID Referring Phys: ARY XU --------------------------------------------------------------------------------  Indications:       CVA. Risk Factors:      Hypertension, hyperlipidemia. Comparison Study:  No prior studies. Performing Technologist: Cordella Collet RVT  Examination Guidelines: A complete evaluation includes B-mode imaging, spectral Doppler, color Doppler, and power Doppler as needed of all accessible portions of each vessel. Bilateral testing is considered an integral part of a complete examination. Limited examinations for reoccurring indications may be performed as noted.  Right Carotid Findings: +----------+--------+--------+--------+-----------------------+--------+           PSV cm/sEDV cm/sStenosisPlaque Description     Comments +----------+--------+--------+--------+-----------------------+--------+ CCA Prox  73      12              smooth and heterogenous          +----------+--------+--------+--------+-----------------------+--------+ CCA Distal66      20              smooth and heterogenous         +----------+--------+--------+--------+-----------------------+--------+ ICA Prox  43      13                                              +----------+--------+--------+--------+-----------------------+--------+ ICA Mid   40      16                                              +----------+--------+--------+--------+-----------------------+--------+ ICA Distal55      21                                     tortuous +----------+--------+--------+--------+-----------------------+--------+ ECA       79      12                                              +----------+--------+--------+--------+-----------------------+--------+ +----------+--------+-------+--------+-------------------+           PSV cm/sEDV cmsDescribeArm Pressure (mmHG) +----------+--------+-------+--------+-------------------+ [id number]                                         +----------+--------+-------+--------+-------------------+ +---------+--------+--+--------+-+---------+ VertebralPSV cm/s24EDV cm/s6Antegrade +---------+--------+--+--------+-+---------+  Left Carotid Findings: +----------+--------+--------+--------+-----------------------+--------+           PSV cm/sEDV cm/sStenosisPlaque Description     Comments +----------+--------+--------+--------+-----------------------+--------+ CCA Prox  86      21  smooth and heterogenous         +----------+--------+--------+--------+-----------------------+--------+ CCA Distal58      17              smooth and heterogenous         +----------+--------+--------+--------+-----------------------+--------+ ICA Prox  28      13                                              +----------+--------+--------+--------+-----------------------+--------+ ICA Mid   55      26                                               +----------+--------+--------+--------+-----------------------+--------+ ICA Distal38      19                                     tortuous +----------+--------+--------+--------+-----------------------+--------+ ECA       67      12                                              +----------+--------+--------+--------+-----------------------+--------+ +----------+--------+--------+--------+-------------------+           PSV cm/sEDV cm/sDescribeArm Pressure (mmHG) +----------+--------+--------+--------+-------------------+ Subclavian118                                         +----------+--------+--------+--------+-------------------+ +---------+--------+--+--------+-+---------+ VertebralPSV cm/s24EDV cm/s7Antegrade +---------+--------+--+--------+-+---------+   Summary: Right Carotid: Velocities in the right ICA are consistent with a 1-39% stenosis. Left Carotid: Velocities in the left ICA are consistent with a 1-39% stenosis. Vertebrals: Bilateral vertebral arteries demonstrate antegrade flow. *See table(s) above for measurements and observations.  Electronically signed by Eather Popp MD on 08/30/2024 at 1:27:18 PM.    Final    MR ANGIO HEAD WO CONTRAST Result Date: 08/29/2024 EXAM: MR Angiography Head without intravenous Contrast. 08/29/2024 11:54:00 AM TECHNIQUE: Magnetic resonance angiography images of the head without intravenous contrast. Multiplanar 2D and 3D reformatted images are provided for review. COMPARISON: None provided. CLINICAL HISTORY: Stroke, hemorrhagic. FINDINGS: ANTERIOR CIRCULATION: There is mild stenosis of the ophthalmic segments of the internal carotid arteries bilaterally. No significant stenosis of the anterior cerebral arteries. 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 There is also severe stenosis of the proximal M2 branches of the left middle cerebral artery. No aneurysm.  POSTERIOR CIRCULATION: No significant stenosis of the posterior cerebral arteries. There is mild-to-moderate focal stenosis of the mid basilar artery, approximately 30 to 40%. No significant stenosis of the vertebral arteries. No aneurysm. IMPRESSION: 1. 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 2. Mild stenosis of the ophthalmic segments of the internal carotid arteries bilaterally. 3. Mild-to-moderate focal stenosis of the mid basilar artery, approximately 30 to 40%. Electronically signed by: Evalene Coho MD 08/29/2024 12:11 PM EST RP Workstation: [id number]   MR BRAIN WO CONTRAST Result Date: 08/29/2024  EXAM: MRI BRAIN WITHOUT CONTRAST 08/29/2024 11:54:00 AM TECHNIQUE: Multiplanar multisequence MRI of the head/brain was performed without the administration of intravenous contrast. COMPARISON: CT of the head dated 08/28/2024. CLINICAL HISTORY: Stroke, hemorrhagic. FINDINGS: BRAIN AND VENTRICLES: No acute infarct. Focal area of intraparenchymal hemorrhage again demonstrated within the left caudate measuring approximately 1.7 x 1.1 x 1.9 cm. Intraventricular hemorrhage again demonstrated primarily within the left lateral ventricle, but also layering independently within the posterior horn of the right lateral ventricle and within the fourth ventricle. No evidence of underlying mass. No midline shift. No hydrocephalus. Extensive cerebral white matter disease. Small focus of hemosiderin staining present within each frontal lobe and within the left anteromedial cerebellar hemisphere. The sella is unremarkable. Normal flow voids. ORBITS: No acute abnormality. SINUSES AND MASTOIDS: No acute abnormality. BONES AND SOFT TISSUES: Normal marrow signal. No acute soft tissue abnormality. IMPRESSION: 1. Focal area of intraparenchymal hemorrhage within the left caudate measuring approximately 1.7 x 1.1 x 1.9 cm, as seen on prior CT.  2. Intraventricular hemorrhage, primarily within the left lateral ventricle, but also layering independently within the posterior horn of the right lateral ventricle and within the fourth ventricle, as seen on prior CT. 3. No evidence of underlying mass. 4. Extensive cerebral white matter disease. 5. Small focus of hemosiderin staining present within each frontal lobe and within the left anteromedial cerebellar hemisphere. Electronically signed by: Evalene Coho MD 08/29/2024 12:08 PM EST RP Workstation: [id number]   ECHOCARDIOGRAM COMPLETE Result Date: 08/28/2024    ECHOCARDIOGRAM REPORT   Patient Name:   Rhonda Cortez Date of Exam: 08/28/2024 Medical Rec #:  [id number]   Height:       63.0 in Accession #:    [id number]  Weight:       140.2 lb Date of Birth:  1961/08/11   BSA:          1.663 m Patient Age:    63 years    BP:           132/84 mmHg Patient Gender: F           HR:           83 bpm. Exam Location:  Inpatient Procedure: 2D Echo (Both Spectral and Color Flow Doppler were utilized during            procedure). Indications:    stroke  History:        Patient has no prior history of Echocardiogram examinations.                 Risk Factors:Hypertension and Dyslipidemia.  Sonographer:    Tinnie Barefoot RDCS Referring Phys: [phone] MCNEILL P KIRKPATRICK IMPRESSIONS  1. Left ventricular ejection fraction, by estimation, is 60 to 65%. The left ventricle has normal function. The left ventricle has no regional wall motion abnormalities. Left ventricular diastolic parameters were normal.  2. Right ventricular systolic function is normal. The right ventricular size is normal. There is normal pulmonary artery systolic pressure. The estimated right ventricular systolic pressure is 31.1 mmHg.  3. The mitral valve is normal in structure. Trivial mitral valve regurgitation. No evidence of mitral stenosis.  4. The aortic valve is tricuspid. Aortic valve regurgitation is not visualized. No aortic stenosis is present.   5. The inferior vena cava is normal in size with greater than 50% respiratory variability, suggesting right atrial pressure of 3 mmHg. FINDINGS  Left Ventricle: Left ventricular ejection fraction, by estimation, is 60 to 65%. The left ventricle has  normal function. The left ventricle has no regional wall motion abnormalities. The left ventricular internal cavity size was normal in size. There is  no left ventricular hypertrophy. Left ventricular diastolic parameters were normal. Right Ventricle: The right ventricular size is normal. No increase in right ventricular wall thickness. Right ventricular systolic function is normal. There is normal pulmonary artery systolic pressure. The tricuspid regurgitant velocity is 2.65 m/s, and  with an assumed right atrial pressure of 3 mmHg, the estimated right ventricular systolic pressure is 31.1 mmHg. Left Atrium: Left atrial size was normal in size. Right Atrium: Right atrial size was normal in size. Pericardium: There is no evidence of pericardial effusion. Mitral Valve: The mitral valve is normal in structure. Trivial mitral valve regurgitation. No evidence of mitral valve stenosis. Tricuspid Valve: The tricuspid valve is normal in structure. Tricuspid valve regurgitation is trivial. Aortic Valve: The aortic valve is tricuspid. Aortic valve regurgitation is not visualized. No aortic stenosis is present. Pulmonic Valve: The pulmonic valve was not well visualized. Pulmonic valve regurgitation is trivial. Aorta: The aortic root is normal in size and structure. Venous: The inferior vena cava is normal in size with greater than 50% respiratory variability, suggesting right atrial pressure of 3 mmHg. IAS/Shunts: The interatrial septum was not well visualized.  LEFT VENTRICLE PLAX 2D LVIDd:         3.80 cm   Diastology LVIDs:         2.60 cm   LV e' medial:    10.20 cm/s LV PW:         0.90 cm   LV E/e' medial:  10.5 LV IVS:        0.80 cm   LV e' lateral:   9.14 cm/s LVOT diam:      1.80 cm   LV E/e' lateral: 11.7 LV SV:         53 LV SV Index:   32 LVOT Area:     2.54 cm LV IVRT:       67 msec  RIGHT VENTRICLE             IVC RV Basal diam:  2.40 cm     IVC diam: 1.80 cm RV S prime:     12.60 cm/s TAPSE (M-mode): 1.6 cm LEFT ATRIUM             Index        RIGHT ATRIUM          Index LA diam:        2.70 cm 1.62 cm/m   RA Area:     9.32 cm LA Vol (A2C):   27.6 ml 16.60 ml/m  RA Volume:   19.60 ml 11.79 ml/m LA Vol (A4C):   28.9 ml 17.38 ml/m LA Biplane Vol: 29.0 ml 17.44 ml/m  AORTIC VALVE LVOT Vmax:   109.00 cm/s LVOT Vmean:  69.700 cm/s LVOT VTI:    0.210 m  AORTA Ao Root diam: 2.40 cm MITRAL VALVE                TRICUSPID VALVE MV Area (PHT): 5.02 cm     TR Peak grad:   28.1 mmHg MV Decel Time: 151 msec     TR Vmax:        265.00 cm/s MV E velocity: 107.00 cm/s MV A velocity: 109.00 cm/s  SHUNTS MV E/A ratio:  0.98         Systemic VTI:  0.21 m  Systemic Diam: 1.80 cm Lonni Nanas MD Electronically signed by Lonni Nanas MD Signature Date/Time: 08/28/2024/2:47:28 PM    Final    CT HEAD WO CONTRAST ( ) Result Date: 08/28/2024 EXAM: CT HEAD WITHOUT CONTRAST 08/28/2024 05:43:00 AM TECHNIQUE: CT of the head was performed without the administration of intravenous contrast. Automated exposure control, iterative reconstruction, and/or weight based adjustment of the mA/kV was utilized to reduce the radiation dose to as low as reasonably achievable. COMPARISON: 08/27/2024 CLINICAL HISTORY: Stroke, hemorrhagic FINDINGS: BRAIN AND VENTRICLES: The left caudate intraparenchymal hemorrhage noted on the previous study appears unchanged in the interim. There is also no significant interval change in the volume or appearance of intraventricular hemorrhage, which is primarily situated within the left lateral ventricle but is also again demonstrated within the third and fourth ventricles and dependently within the posterior horn of the right lateral  ventricle. There is moderate periventricular and deep cerebral white matter disease. The encephalomalacia changes described within the right anterior frontal lobe on the prior study is not appreciated on the current exam and was likely superior artifact caused by prominent calcification within the falx. There is no adverse interval change. No evidence of acute infarct. No hydrocephalus. No extra-axial collection. No mass effect or midline shift. ORBITS: No acute abnormality. SINUSES: No acute abnormality. SOFT TISSUES AND SKULL: No acute soft tissue abnormality. No skull fracture. IMPRESSION: 1. Stable left caudate intraparenchymal hemorrhage and intraventricular hemorrhage. 2. Moderate periventricular and deep cerebral white matter disease. 3. No adverse interval change. Electronically signed by: Evalene Coho MD 08/28/2024 06:17 AM EST RP Workstation: [id number]   CT HEAD WO CONTRAST Result Date: 08/27/2024 EXAM: CT HEAD WITHOUT study_datetime TECHNIQUE: CT of the head was performed without the administration of intravenous contrast. Automated exposure control, iterative reconstruction, and/or weight based adjustment of the mA/kV was utilized to reduce the radiation dose to as low as reasonably achievable. COMPARISON: None available. CLINICAL HISTORY: 63 year old female was missing overnight, memory loss. FINDINGS: BRAIN AND VENTRICLES: Moderate to large volume of hyperdense blood in the left lateral ventricle, the 3rd and 4th ventricles. There is evidence of associated biconvex intra-axial hemorrhage of the left caudate nucleus on series 2 image 15. Estimated caudate intra-axial blood products measure 18 x 10 x 17 mm (AP x transverse x CC), with an estimated volume of 2 mL. Probable tubular hypodense thrombus is present within the left lateral ventricle blood products. There is a trace rightward midline shift (2 to 3 mm) in part related to mild asymmetric enlargement of the left lateral ventricle. Temporal  horns are diminutive. No transependymal edema is evident. Basilar cisterns remain patent. There is no convincing subarachnoid hemorrhage. Patchy and confluent bilateral cerebral white matter hypodensity in both hemispheres is advanced for age. There is evidence of small volume cortical encephalomalacia in the anterior right frontal lobe medially on series 2 image 17. Heterogeneous hypodensity is also noted in the bilateral thalami. Posterior fossa gray white differentiation is within normal limits. Calcified atherosclerosis is present at the skull base. Asymmetric right basal ganglia vascular calcifications are noted. No suspicious intracranial vascular hyperdensity. ORBITS: No acute abnormality. SINUSES AND MASTOIDS: Visible paranasal sinuses, middle ears and mastoids are well aerated. SOFT TISSUES AND SKULL: No acute skull fracture. No acute orbital scalp soft tissue finding. IMPRESSION: 1. Acute intracranial hemorrhage appears to be significant intraventricular extension from an acute bleed in the left caudate nucleus (estimated 2 mL). 2. Mild asymmetric enlargement of the left lateral due to blood ;temporal horns remain  diminutive. Trace rightward midline shift (23 mm). 3. Evidence of underlying age advanced chronic small vessel disease. 4. Study discussed by telephone with Dr. Pamella at the time of this report. Electronically signed by: Helayne Hurst MD 08/27/2024 01:44 PM EST RP Workstation: [id number]    Labs:  Basic Metabolic Panel: Recent Labs  Lab 08/31/24 0148 09/01/24 0440 09/02/24 0406 09/03/24 0544 09/05/24 0431  NA 138 138 133* 134* 136  K 3.7 3.9 3.9 4.0 3.8  CL 103 99 98 99 101  CO2 22 23 23 22  21*  GLUCOSE 126* 126* 135* 128* 109*  BUN 25* 24* 27* 29* 30*  CREATININE 1.81* 2.20* 1.84* 1.76* 1.81*  CALCIUM  9.0 9.4 9.0 9.2 9.2  MG  --   --  2.0  --   --   PHOS  --   --  3.6  --   --     CBC: Recent Labs  Lab 09/02/24 0406 09/03/24 0544 09/05/24 0431  WBC 12.2* 13.8* 13.0*   NEUTROABS  --  8.7*  --   HGB 14.6 14.1 14.2  HCT 42.5 40.4 41.5  MCV 85.5 86.0 87.2  PLT 294 319 301    CBG: Recent Labs  Lab 09/04/24 1157  GLUCAP 179*    Brief HPI:   Rhonda Cortez is a 63 y.o. female with PMHx of hypertension, HLD, and history of CVA in 2014 presented to [facility] on 08/27/2024.  Per chart review the patient was supposed to arrive at a friend's home around 7 PM that night but did not arrive.  When the patient answered her phone around 3 AM she was confused and had difficulty finding words.  In the ED a head CT showed acute intracranial hemorrhage from the left caudate region with extension of the intraventricular region on the left.  Neurology was consulted and the patient was admitted to the ICU.  A repeat CT head showed caudate head hemorrhage with intraventricular extension.  Patient underwent an MRI that confirmed intraventricular hemorrhage, no evidence of underlying mass. 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  Neurology recommended no antithrombotics due to ICH.  Patient was on aspirin  after stroke (2014) for a number of years and then off. VTE prophylaxis SCDs. 2D echo with EF 60 to 65%.  Hospital course complicated by AKI, ongoing headaches, and new fever of unknown origin.  Prior to arrival the patient was fully independent working and driving with no use of DME.  She lives alone in a 1 level home with 2 stairs to enter.  Patient currently requires contact-guard assist to min assist for mobility.Therapy evaluations completed due to patient decreased functional mobility was admitted for a comprehensive rehab program.      Inpatient Rehabilitation Course: Rhonda Cortez was admitted to rehab 09/02/2024 for inpatient therapies to consist of PT, ST and OT at least three hours five days a week. Past admission physiatrist, therapy team and rehab RN have worked together to provide customized  collaborative inpatient rehab. The patient was admitted to CIR on 09/02/2024. On 09/04/2024 the patient was ambulating to the bathroom with supervision. While on the toilet her legs became rigid, she was able to follow simple commands but with minimal verbal output. According to family at bedside this is not her norm. Code stroke was activated and CT head and CTA were performed, CTA was negative for acute abnormality. CTH revealed resolving ICH. Neurology was  consulted and recommended 24hr EEG. Unfortunately unable to complete CIR course due to seizure in the setting of recent ICH.  The patient was transferred to acute and admitted by TR[facility].    Rehab course: During patient's stay in rehab weekly team conferences were held to monitor patient's progress, set goals and discuss barriers to discharge. At admission, patient required contact-guard assist to min assist for mobility. Unfortunately unable to complete CIR course due to seizure in the setting of recent ICH.      Disposition: Discharge disposition: 02-Transferred to [facility]        Allergies as of 09/04/2024   No Known Allergies      Medication List     ASK your doctor about these medications    acetaminophen  325 MG tablet Commonly known as: TYLENOL  Take 2 tablets (650 mg total) by mouth every 4 (four) hours as needed for mild pain (pain score 1-3), fever or headache (or temp > 37.5 C (99.5 F)).   amLODipine  10 MG tablet Commonly known as: NORVASC  Take 10 mg by mouth daily.   atorvastatin  40 MG tablet Commonly known as: LIPITOR Take 1 tablet (40 mg total) by mouth daily.   oxyCODONE  5 MG immediate release tablet Commonly known as: Oxy IR/ROXICODONE  Take 1 tablet (5 mg total) by mouth every 6 (six) hours as needed for moderate pain (pain score 4-6) or severe pain (pain score 7-10).   pantoprazole  40 MG tablet Commonly known as: PROTONIX  Take 1 tablet (40 mg total) by mouth at bedtime.   polyethylene glycol 17  g packet Commonly known as: MiraLax  Take 17 g by mouth daily as needed for moderate constipation or severe constipation.   senna-docusate 8.6-50 MG tablet Commonly known as: Senokot-S Take 1 tablet by mouth 2 (two) times daily.   triamcinolone  ointment 0.1 % Commonly known as: KENALOG  Apply 1 Application topically 2 (two) times daily as needed (skin irritation).   valsartan  80 MG tablet Wait to take this until your doctor or other care provider tells you to start again. Commonly known as: DIOVAN  Take 1 tablet (80 mg total) by mouth daily.   VITAMIN D-3 PO Take 1 capsule by mouth daily.         Signed: Daphne LOISE Satterfield 09/06/2024, 9:41 AM

## 2024-09-06 NOTE — Progress Notes (Addendum)
 Progress Note    Rhonda Cortez   FMW:[id number]  DOB: 1960-11-04  DOA: 09/04/2024     2 PCP: Lucius Krabbe, NP  Initial CC: Right sided rigidity/weakness and decreased level of responsiveness  Hospital Course: Rhonda Cortez is a 63 y.o. 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  Bleed was stable with serial scans and she was discharged to CIR on 09/02/2024.  Main symptoms on that admission were confusion and aphasia.  She was doing well in CIR then on 11/22 she developed right sided weakness/rigidity, decreased level of responsiveness and not following commands. Repeat CT head showed no acute abnormalities and resolving known prior ICH.  Given her decreased responsiveness/level of alertness and right sided rigidity, concern was for seizure in relation to underlying recent ICH. She was loaded with Keppra  and admitted back to the hospital for further workup and neurology evaluation. Workup was consistent with seizure associated from prior ICH.  She was transitioned from Keppra  to Vimpat  per neurology.  Interval History:  No issues overnight.  Mentation essentially okay.  She is oriented to name, city, year.  Still has a little bit of difficulty stating where she is or remembering prior events leading up to rehospitalization from rehab. Ambulated well with physical therapy this morning.  Assessment and Plan: * Seizure (HCC) - While in rehab after recent ICH, patient developed right sided weakness/rigidity, decreased responsiveness, not following commands.  Concern was for development of seizure in setting of recent ICH with intraventricular extension - No worsening of bleed noted on repeat CT head on 11/22 - Loaded with Keppra  on 11/22 and continued on Keppra  500 mg twice daily per neurology; transitioned to Vimpat  on 09/05/2024 per neurology. -No  further EEG monitoring needed or recommended per neurology - tentative plan for d/c back to rehab if accepted/approved; PT/OT consulted  History of intracranial hemorrhage - Initially presented on 08/27/2024 with left caudate intracranial hemorrhage with intraventricular extension - Improved slowly and discharged to CIR on 11/20.  See seizure workup above - Follow-up PT/OT recommendations and CIR reevaluation  Acute CVA (cerebrovascular accident) (HCC) - Repeat MRI brain performed on 09/06/2024.  Showing acute infarcts involving left PCA and adjacent MCA/PCA watershed territories with mild cytotoxic edema.  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 -No indication for antiplatelets given recent ICH -Stroke team aware -Patient already seen by PT and OT; will ask for formal SLP eval given stroke  - Has already had recent echo on 08/28/2024; deferred to neurology on need for repeat but likely not - A1c 5.8% -LDL 164.  Has been on Lipitor 40 mg daily  Chronic kidney disease, stage 3b (HCC) - patient has history of CKD3b. Baseline creat ~ 1.8 - 2, eGFR~ 30-35   Mixed hyperlipidemia - Last LDL 164 on 08/29/2024 - Continue Lipitor  History of stroke - Continue Lipitor  Essential (primary) hypertension - SBP goal less than 160 - continue norvasc  - PRN labetalol  and hydralazine     Antimicrobials: None  DVT prophylaxis:  SCDs Start: 09/04/24 2046   Code Status:   Code Status: Full Code  Mobility Assessment (Last 72 Hours)     Mobility Assessment     Row Name 09/06/24 1043 09/06/24 1001 09/06/24 0001 09/05/24 2005 09/05/24 1100   Does the patient have  exclusion criteria? -- -- No - Perform mobility assessment No - Perform mobility assessment --   What is the highest level of mobility based on the mobility assessment? Level 4 (Ambulates with assistance) - Balance while  stepping forward/back - Complete Level 4 (Ambulates with assistance) - Balance while stepping forward/back - Complete Level 4 (Ambulates with assistance) - Balance while stepping forward/back - Complete Level 4 (Ambulates with assistance) - Balance while stepping forward/back - Complete --   Is the above level different from baseline mobility prior to current illness? -- -- -- -- --    Row Name 09/05/24 0800 09/05/24 0345 09/05/24 0015 09/04/24 2000     Does the patient have exclusion criteria? No - Perform mobility assessment No - Perform mobility assessment No - Perform mobility assessment No - Perform mobility assessment    What is the highest level of mobility based on the mobility assessment? Level 2 (Chairfast) - Balance while sitting on edge of bed and cannot stand Level 4 (Ambulates with assistance) - Balance while stepping forward/back - Complete Level 4 (Ambulates with assistance) - Balance while stepping forward/back - Complete Level 4 (Ambulates with assistance) - Balance while stepping forward/back - Complete    Is the above level different from baseline mobility prior to current illness? Yes - Recommend PT order -- -- --       Diet: Diet Orders (From admission, onward)     Start     Ordered   09/04/24 2047  Diet Heart Room service appropriate? Yes; Fluid consistency: Thin  Diet effective now       Question Answer Comment  Room service appropriate? Yes   Fluid consistency: Thin      09/04/24 2053            Barriers to discharge: None Disposition Plan: Possibly CIR again HH orders placed: TBD Status is: Inpatient  Objective: Blood pressure (!) 142/88, pulse 96, temperature 99.1 F (37.3 C), temperature source Oral, resp. rate 18, SpO2 100%.  Examination:  Physical Exam Constitutional:      Comments: Lethargic appearing but awakens easily and able to answer most questions and follow commands easily  HENT:     Head: Normocephalic and atraumatic.     Mouth/Throat:      Mouth: Mucous membranes are moist.  Eyes:     Extraocular Movements: Extraocular movements intact.  Cardiovascular:     Rate and Rhythm: Normal rate and regular rhythm.  Pulmonary:     Effort: Pulmonary effort is normal. No respiratory distress.     Breath sounds: Normal breath sounds. No wheezing.  Abdominal:     General: Bowel sounds are normal. There is no distension.     Palpations: Abdomen is soft.     Tenderness: There is no abdominal tenderness.  Musculoskeletal:        General: Normal range of motion.     Cervical back: Normal range of motion and neck supple.  Skin:    General: Skin is warm and dry.  Neurological:     Comments: Alert and oriented x 3.  No obvious focal deficits.  Slowed mentation  Psychiatric:        Mood and Affect: Mood normal.      Consultants:  Neurology  Procedures:    Data Reviewed: No results found for this or any previous visit (from the past 24 hours).   I have reviewed pertinent nursing notes, vitals, labs, and images as necessary. I have ordered labwork to follow up  on as indicated.  I have reviewed the last notes from staff over past 24 hours. I have discussed patient's care plan and test results with nursing staff, CM/SW, and other staff as appropriate.  Old records reviewed in assessment of this patient  Time spent: Greater than 50% of the 55 minute visit was spent in counseling/coordination of care for the patient as laid out in the A&P.   LOS: 2 days   Alm Apo, MD [facility] 09/06/2024, 12:47 PM

## 2024-09-06 NOTE — Progress Notes (Signed)
 Inpatient Rehabilitation Care Coordinator Discharge Note   Patient Details  Name: Rhonda Cortez MRN: [id number] Date of Birth: [birth year]   Discharge location: transferred to acute due to medical issues  Length of Stay: 3 days  Discharge activity level: min assist  Home/community participation: active  Patient response un:Yzjouy Literacy - How often do you need to have someone help you when you read instructions, pamphlets, or other written material from your doctor or pharmacy?: Patient unable to respond  Patient response un:Dnrpjo Isolation - How often do you feel lonely or isolated from those around you?: Patient unable to respond  Services provided included: MD, RD, PT, OT, SLP, RN, CM, Pharmacy, SW  Financial Services:  Field Seismologist Utilized: Physiological Scientist state health  Choices offered to/list presented to: na  Follow-up services arranged:              Patient response to transportation need: Is the patient able to respond to transportation needs?: No In the past 12 months, has lack of transportation kept you from medical appointments or from getting medications?: No In the past 12 months, has lack of transportation kept you from meetings, work, or from getting things needed for daily living?: No   Patient/Family verbalized understanding of follow-up arrangements:  Other (Comment)  Individual responsible for coordination of the follow-up plan: self  Confirmed correct DME delivered: [name] MATSU 09/06/2024    Comments (or additional information):transferred to acute due to medical issues had only been on rehab for three days was evaluated and will still need to complete her rehab once medically stable    Rogene Meth, Asberry MATSU

## 2024-09-06 NOTE — Progress Notes (Signed)
 Received from MRI accompanied by the transporter.

## 2024-09-06 NOTE — TOC CM/SW Note (Signed)
 Transition of Care [facility]) - Inpatient Brief Assessment   Patient Details  Name: Rhonda Cortez MRN: [id number] Date of Birth: [birth year]  [facility] CM/SW Contact:    Almarie CHRISTELLA Goodie, LCSW Phone Number: 09/06/2024, 2:29 PM   Clinical Narrative:   Patient recent readmit, was on CIR with new medical concerns. CSW updated by Rehab Admissions of plan to return, pending new insurance authorization. CSW to follow.    Transition of Care Asessment: Insurance and Status: Insurance coverage has been reviewed Patient has primary care physician: Yes Home environment has been reviewed: Recent admit to CIR, previously at home Prior level of function:: Independent Prior/Current Home Services: No current home services Social Drivers of Health Review: SDOH reviewed no interventions necessary Readmission risk has been reviewed: Yes Transition of care needs: transition of care needs identified, TOC will continue to follow

## 2024-09-06 NOTE — Evaluation (Signed)
 Occupational Therapy Evaluation Patient Details Name: Rhonda Cortez MRN: [id number] DOB: 09/08/1961 Today's Date: 09/06/2024   History of Present Illness   Pt is a 63 y.o. F who was originally admitted for small L caudate IPH and subsequently discharged to CIR. Code stroke called on CIR 11/22 due to acute onset poor responsiveness, favored to be a seizure. No seizures on EEG. PMH: HTN, CVA.     Clinical Impressions Pt presenting with problem above with deficits mentioned below. Upon eval, pt remains lethargic throughout session, with slowed processing speed, needing increased time and cues for following commands. Pt with generalized weakness, decreased orientation, problem solving, safety, balance, and awareness. Pt needing up to min A for BADL with intermittent cues to maintain arousal. OT to continue to follow. Recommending discharge back to inpatient rehab >3 hours.     If plan is discharge home, recommend the following:   A little help with walking and/or transfers;A little help with bathing/dressing/bathroom;Assistance with cooking/housework;Assist for transportation;Help with stairs or ramp for entrance     Functional Status Assessment   Patient has had a recent decline in their functional status and demonstrates the ability to make significant improvements in function in a reasonable and predictable amount of time.     Equipment Recommendations   Other (comment)     Recommendations for Other Services         Precautions/Restrictions   Precautions Precautions: Fall Recall of Precautions/Restrictions: Impaired Restrictions Weight Bearing Restrictions Per Provider Order: No     Mobility Bed Mobility Overal bed mobility: Needs Assistance Bed Mobility: Supine to Sit, Sit to Supine     Supine to sit: Supervision Sit to supine: Supervision   General bed mobility comments: increased time    Transfers Overall transfer level: Needs assistance Equipment used:  None Transfers: Sit to/from Stand Sit to Stand: Contact guard assist           General transfer comment: CGA for power up, denies dizziness or lightheadedness      Balance Overall balance assessment: Needs assistance Sitting-balance support: Bilateral upper extremity supported, Single extremity supported, Feet supported Sitting balance-Leahy Scale: Poor Sitting balance - Comments: sitting EOB   Standing balance support: No upper extremity supported, Bilateral upper extremity supported, During functional activity Standing balance-Leahy Scale: Fair Standing balance comment: unsteadiness noted                           ADL either performed or assessed with clinical judgement   ADL Overall ADL's : Needs assistance/impaired Eating/Feeding: Set up;Sitting   Grooming: Minimal assistance;Standing   Upper Body Bathing: Set up;Sitting   Lower Body Bathing: Minimal assistance;Sitting/lateral leans;Sit to/from stand   Upper Body Dressing : Set up;Sitting   Lower Body Dressing: Minimal assistance;Sitting/lateral leans;Sit to/from stand   Toilet Transfer: Minimal assistance;Rolling walker (2 wheels)   Toileting- Clothing Manipulation and Hygiene: Contact guard assist;Sitting/lateral lean       Functional mobility during ADLs: Minimal assistance;Rolling walker (2 wheels)       Vision Baseline Vision/History: 0 No visual deficits Ability to See in Adequate Light: 0 Adequate Patient Visual Report: No change from baseline Vision Assessment?: No apparent visual deficits Additional Comments: mild undershooting with finger to nose, but pt reports vision has not changed, suspect due to weakness     Perception Perception: Impaired Preception Impairment Details: Spatial orientation     Praxis Praxis: WFL       Pertinent Vitals/Pain Pain Assessment  Pain Assessment: No/denies pain Pain Intervention(s): Monitored during session     Extremity/Trunk Assessment Upper  Extremity Assessment Upper Extremity Assessment: Generalized weakness;RUE deficits/detail;LUE deficits/detail RUE Deficits / Details: per chart review, when admissed 11/14, pt reports prior CVA resulted in R sided weakness, overall WFL during session, 4/5 push/shoulder flexion. mildly decr coordination with finger to nose bilaterally with mild undershooting LUE Deficits / Details: mildly decr coordination with finger to nose bilaterally with mild undershooting   Lower Extremity Assessment Lower Extremity Assessment: Defer to PT evaluation   Cervical / Trunk Assessment Cervical / Trunk Assessment: Normal   Communication Communication Communication: Impaired Factors Affecting Communication: Reduced clarity of speech;Difficulty expressing self   Cognition Arousal: Lethargic, Alert Behavior During Therapy: Flat affect Cognition: Cognition impaired, Difficult to assess Difficult to assess due to: Level of arousal Orientation impairments: Time, Place, Situation (unaware back in acute from CIR or why) Awareness: Online awareness impaired   Attention impairment (select first level of impairment): Sustained attention (short periods) Executive functioning impairment (select all impairments): Organization, Sequencing, Problem solving, Initiation OT - Cognition Comments: following one step commands with incresaed time and cues. slow mentation and processing throughout session                 Following commands: Impaired Following commands impaired: Follows multi-step commands with increased time     Cueing  General Comments   Cueing Techniques: Verbal cues  Noting slow mentation and general lethargy throughout session. Required inc time for yes/no questions,   Exercises     Shoulder Instructions      Home Living Family/patient expects to be discharged to:: Private residence Living Arrangements: Alone Available Help at Discharge: Family;Available 24 hours/day Type of Home:  House Home Access: Stairs to enter [employer] of Steps: 4-5 front entrance with rails, 1 step back entrance (usual entrance) no rail Entrance Stairs-Rails: Can reach both Home Layout: One level     Bathroom Shower/Tub: Walk-in shower;Tub/shower unit   Bathroom Toilet: Standard Bathroom Accessibility: Yes   Home Equipment: Shower seat;Grab bars - tub/shower;Hand held shower head      Lives With: Significant other    Prior Functioning/Environment Prior Level of Function : Independent/Modified Independent;Driving;Working/employed             Mobility Comments: No AD 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 Independent in ADL, IADL, working, driving    OT Problem List: Decreased strength;Decreased activity tolerance;Impaired balance (sitting and/or standing);Decreased coordination;Decreased cognition;Decreased knowledge of use of DME or AE;Decreased knowledge of precautions   OT Treatment/Interventions: Self-care/ADL training;Therapeutic exercise;DME and/or AE instruction;Therapeutic activities;Patient/family education;Balance training;Cognitive remediation/compensation      OT Goals(Current goals can be found in the care plan section)   Acute Rehab OT Goals Patient Stated Goal: get better OT Goal Formulation: With patient Time For Goal Achievement: 09/20/24 Potential to Achieve Goals: Good ADL Goals Pt Will Perform Grooming: with supervision;standing Pt Will Perform Lower Body Dressing: with supervision;sit to/from stand Pt Will Transfer to Toilet: with supervision;ambulating;regular height toilet Pt/caregiver will Perform Home Exercise Program: Increased strength;Both right and left upper extremity   OT Frequency:  Min 2X/week    Co-evaluation              AM-PAC OT 6 Clicks Daily Activity     Outcome Measure Help from another person eating meals?: A Little Help from another person taking care of personal grooming?: A Little Help from  another person toileting, which includes using toliet, bedpan, or urinal?: A Little  Help from another person bathing (including washing, rinsing, drying)?: A Little Help from another person to put on and taking off regular upper body clothing?: A Little Help from another person to put on and taking off regular lower body clothing?: A Little 6 Click Score: 18   End of Session Equipment Utilized During Treatment: Gait belt;Rolling walker (2 wheels) Nurse Communication: Mobility status  Activity Tolerance: Patient tolerated treatment well Patient left: in bed;with call bell/phone within reach;with bed alarm set  OT Visit Diagnosis: Unsteadiness on feet (R26.81);Muscle weakness (generalized) (M62.81);Other symptoms and signs involving cognitive function                Time: 9089-9073 OT Time Calculation (min): 16 min Charges:  OT General Charges $OT Visit: 1 Visit OT Evaluation $OT Eval Moderate Complexity: 1 Mod  Rhonda Cortez, OTR/L [facility] Acute Rehabilitation Office: [phone]   Rhonda JONETTA Lebron 09/06/2024, 10:47 AM

## 2024-09-06 NOTE — Progress Notes (Signed)
 STROKE TEAM PROGRESS NOTE   INTERIM HISTORY/SUBJECTIVE Brother is at the bedside. Pt is lying in bed, lethargic, needed repetitive stimulation to keep eyes open. Talked with her sister over the phone. On 11/22, she has worked with PT/OT in the morning, around 1230pm, she went to bathroom and when she got up from bathroom, she had whole body weakness, not able to walk, eyes staring off, LOC, staff put her back to bed. No BP checked at that time. No shaking or jerking, but reported some posturing of limbs. She was transferred back to acute side and LTM EEG no seizure. MRI showed left PCA infarct.     CBC    Component Value Date/Time   WBC 13.0 (H) 09/05/2024 0431   RBC 4.76 09/05/2024 0431   HGB 14.2 09/05/2024 0431   HCT 41.5 09/05/2024 0431   PLT 301 09/05/2024 0431   MCV 87.2 09/05/2024 0431   MCH 29.8 09/05/2024 0431   MCHC 34.2 09/05/2024 0431   RDW 11.9 09/05/2024 0431   LYMPHSABS 3.8 09/03/2024 0544   MONOABS 1.2 (H) 09/03/2024 0544   EOSABS 0.0 09/03/2024 0544   BASOSABS 0.0 09/03/2024 0544    BMET    Component Value Date/Time   NA 136 09/05/2024 0431   K 3.8 09/05/2024 0431   CL 101 09/05/2024 0431   CO2 21 (L) 09/05/2024 0431   GLUCOSE 109 (H) 09/05/2024 0431   BUN 30 (H) 09/05/2024 0431   CREATININE 1.81 (H) 09/05/2024 0431   CALCIUM  9.2 09/05/2024 0431   EGFR 33.0 05/12/2024 0000   GFRNONAA 31 (L) 09/05/2024 0431    IMAGING past 24 hours MR BRAIN WO CONTRAST Result Date: 09/06/2024 EXAM: MRI BRAIN WITHOUT CONTRAST 09/06/2024 11:47:55 AM TECHNIQUE: Multiplanar multisequence MRI of the head/brain was performed without the administration of intravenous contrast. COMPARISON: Head CT and CTA head and neck 09/04/2024. Brain MRI 08/29/2024. CLINICAL HISTORY: 63 year old female with left caudate hemorrhage extending into the ventricular system and recurrent code stroke presentation 2 days ago. FINDINGS: BRAIN AND VENTRICLES: There is gyriform and nodular abnormal  diffusion in the posterior left hemisphere affecting both the left PCA territory (including posterior left temporal lobe on series 2 image 21) and the posterior left MCA / PCA watershed (left parietal lobe series 3 image 10). These areas are restricted, and demonstrate gyriform T2 and FLAIR hyperintense cytotoxic edema. No evidence of hemorrhagic transformation. No other acute ischemia identified. Intraventricular blood, dominant in the left lateral ventricle, does not appear progressed since the previous MRI. Residual left caudate intra axial blood products with expected evolution. SWI demonstrates multiple superimposed chronic microhemorrhages scattered in the bilateral deep gray nuclei, in the left deep cerebellar nucleus, and occasionally elsewhere in the cerebral and cerebellar hemispheres. Mild superimposed superficial siderosis also. The pattern more resembles ordinary chronic small vessel disease than amyloid angiopathy at this time. Blood product susceptibility on diffusion imaging. No significant midline shift. Borderline to mild ventriculomegaly and possible mild associated transependymal edema (series 6 image 17) are not progressed. The sella is unremarkable. Normal flow voids. Basilar cisterns remain patent. No increased intracranial mass effect. ORBITS: No acute abnormality. SINUSES AND MASTOIDS: Paranasal sinuses and mastoids are well aerated. BONES AND SOFT TISSUES: Normal marrow signal. No acute soft tissue abnormality. Stable and negative visible cervical spine and bone marrow signal. IMPRESSION: 1. Acute infarcts in the Left PCA and adjacent MCA/PCA watershed territories with mild cytotoxic edema. No acute hemorrhagic transformation. 2. Slowing regressing prior left caudate and intraventricular hemorrhage, without progression  from earlier this month. Borderline to mild ventriculomegaly and transependymal edema are stable. 3. No significant intracranial mass effect, midline shift. 4. Numerous  chronic microhemorrhages and mild superficial siderosis, but pattern currently favoring ordinary small vessel disease over amyloid angiopathy. Electronically signed by: Helayne Hurst MD 09/06/2024 11:57 AM EST RP Workstation: [id number]    Vitals:   09/06/24 0820 09/06/24 0909 09/06/24 0943 09/06/24 1204  BP: (!) 141/95 136/78 136/78 (!) 142/88  Pulse: 90 87  96  Resp:  18    Temp: 98.4 F (36.9 C) 98.8 F (37.1 C)  99.1 F (37.3 C)  TempSrc: Oral Oral  Oral  SpO2: 100% 100%  100%     PHYSICAL EXAM  Temp:  [98.4 F (36.9 C)-100.3 F (37.9 C)] 100.3 F (37.9 C) (11/24 1636) Pulse Rate:  [87-99] 96 (11/24 1636) Resp:  [18] 18 (11/24 0909) BP: (132-158)/(75-95) 144/90 (11/24 1636) SpO2:  [97 %-100 %] 97 % (11/24 1636)  General - Well nourished, well developed, in no apparent distress, lethargic.  Ophthalmologic - fundi not visualized due to noncooperation.  Cardiovascular - Regular rhythm and rate.  Neuro - drowsy sleepy lethargic, eyes open on voice but needed repetitive stimulation to keep eyes open, not orientated, said I don't know to questions, intermittent word salad but able to name 2/2 and able to repeat, following most simple commands. No gaze palsy, tracking bilaterally, visual field full, no hemianopia. Slight facial droop. Tongue midline. Bilateral UEs 4/5, no drift. Bilaterally LEs 3/5. Sensation symmetrical bilaterally, b/l FTN intact but slow, gait not tested.     ASSESSMENT/PLAN  Rhonda Cortez is a 63 y.o. female with history of hx CKD and HTN initially admitted for small L caudate IPH and subsequently discharged to CIR. Stroke code was called on 11/22 for acute onset poor responsiveness in bathroom, concerning for seizure. No seizures on LTM EEG overnight. She has difficulty swallowing pills therefore keppra  changed to vimpat . MRI however, showed left PCA and MCA/PCA infarct.   Stroke:  left PCA and MCA/PCA watershed territories infarcts with cytotoxic edema,  etiology: Likely large vessel disease, probably in the setting of syncope/orthostatic hypotension Code Stroke CT head No acute abnormality.  Resolving left caudate and left lateral ventricular hemorrhage without new hemorrhage. Stable periventricular and subcortical white matter changes bilaterally, moderately advanced for age.  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 Moderate tandem stenoses in the proximal right P2 segment and proximal right P3 segment. 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 MRI: Acute infarcts in the Left PCA and adjacent MCA/PCA watershed territories with mild cytotoxic edema. No acute hemorrhagic transformation. Slowing regressing prior left caudate and intraventricular hemorrhage, without progression from earlier this month. Borderline to mild ventriculomegaly and transependymal edema are stable. Numerous chronic microhemorrhages and mild superficial siderosis, but pattern currently favoring ordinary small vessel disease over amyloid angiopathy. VTE prophylaxis - heparin  subcu No antithrombotic prior to admission, now on aspirin  81.  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  CT and MRI repeat showed stable hematoma.  MRA showed severe stenosis left M1 and M2.  Left fetal PCA P2 occlusion.  Mild to moderate stenosis mid basilar artery.  EF  50 to 65%.  Carotid Doppler unremarkable.  LDL 164, A1c 5.8.  Discharged to CIR Lipitor 40.  Hx of Stroke/TIA Stroke in 2014 - arm and leg weakness, difficulty walking. Was on ASA after stroke for a number of years and then off. No significant residual deficit  Hypertension Home meds:  Norvasc , valsartan  On home norvasc   Stable now BP goal less than 160 Long-term BP goal normotensive   Hyperlipidemia Home meds:  pitavastatin  LDL 164, goal < 70 On atorvastatin  40mg   Continue statin on discharge  CKD 3b Cre 2.19--1.95--2.2--1.76--1.81 BMP monitoring Avoid nephro toxic agent  Other stroke risk factors   Other clinical issues Leukocytosis WBC 11.9--12.2--13.8--13.0  Hospital day # 2  I discussed with Dr. Patsy. I spent extensive total face-to-face time with the patient and her brother at bedside and her sister over the phone, reviewing test results, images and medication, and discussing the diagnosis, treatment plan and potential prognosis. This patient's care requiresreview of multiple databases, neurological assessment, discussion with family, other specialists and medical decision making of high complexity.  Ary Cummins, MD PhD Stroke Neurology 09/06/2024 6:46 PM       To contact Stroke Continuity provider, please refer to [email or web address]. After hours, contact General Neurology

## 2024-09-06 NOTE — Assessment & Plan Note (Addendum)
-   Repeat MRI brain performed on 09/06/2024.  Showing acute infarcts involving left PCA and adjacent MCA/PCA watershed territories with mild cytotoxic edema.  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 -No indication for antiplatelets given recent ICH -Stroke team aware -Patient already seen by PT and OT; will ask for formal SLP eval given stroke  - Has already had recent echo on 08/28/2024; deferred to neurology on need for repeat but likely not - A1c 5.8% -LDL 164.  Has been on Lipitor 40 mg daily

## 2024-09-06 NOTE — Progress Notes (Signed)
 Inpatient Rehab Admissions Coordinator:   Met with pt and her sister at bedside to discuss returning to CIR.  Both are in agreement. I let them know I would need to resubmit to insurance for prior auth and I can start that today.   Reche Lowers, PT, DPT Admissions Coordinator [phone] 09/06/24 1:19 PM

## 2024-09-06 NOTE — Progress Notes (Signed)
 Transfer to MRI accompanied by the transporter.

## 2024-09-06 NOTE — Evaluation (Signed)
 Physical Therapy Evaluation Patient Details Name: Rhonda Cortez MRN: [id number] DOB: 1960-11-09 Today's Date: 09/06/2024  History of Present Illness  Pt is a 63 y.o. F who was originally admitted for small L caudate IPH and subsequently discharged to CIR. Code stroke called on CIR 11/22 due to acute onset poor responsiveness, favored to be a seizure. No seizures on EEG. PMH: HTN, CVA.  Clinical Impression  Pt received in supine and agreeable to PT session. Ambulate 2 bouts of 60' CGA today, attempting with and without RW. Without RW, pt demonstrates several instances of crossing feet with stumbling, but able to recover without physical assist. With RW, gait speed improved, as well as overall stability. Demonstrates good RW proximity with gait as well as during turns and avoiding obstacles. Throughout session, noted slow mentation with yes/no questions, but pt able to follow one step commands with inc time. Pt remains an appropriate candidate for post-acute rehab >3hrs/day to improve independence and safety with functional mobility. Acute PT to follow.       If plan is discharge home, recommend the following: A little help with walking and/or transfers;A little help with bathing/dressing/bathroom;Assistance with cooking/housework;Direct supervision/assist for medications management;Direct supervision/assist for financial management;Assist for transportation;Help with stairs or ramp for entrance   Can travel by private vehicle        Equipment Recommendations Rolling walker (2 wheels);BSC/3in1  Recommendations for Other Services  Rehab consult    Functional Status Assessment Patient has had a recent decline in their functional status and demonstrates the ability to make significant improvements in function in a reasonable and predictable amount of time.     Precautions / Restrictions Precautions Precautions: Fall Recall of Precautions/Restrictions: Impaired Restrictions Weight Bearing  Restrictions Per Provider Order: No      Mobility  Bed Mobility Overal bed mobility: Needs Assistance Bed Mobility: Supine to Sit, Sit to Supine     Supine to sit: Supervision Sit to supine: Supervision   General bed mobility comments: increased time    Transfers Overall transfer level: Needs assistance Equipment used: None Transfers: Sit to/from Stand Sit to Stand: Contact guard assist           General transfer comment: CGA for power up, denies dizziness or lightheadedness    Ambulation/Gait Ambulation/Gait assistance: Contact guard assist, Min assist Gait Distance (Feet): 60 Feet (x2) Assistive device: None, Rolling walker (2 wheels) Gait Pattern/deviations: Step-to pattern, Decreased stride length, Decreased dorsiflexion - right, Decreased dorsiflexion - left, Knees buckling, Narrow base of support Gait velocity: reduced Gait velocity interpretation: <1.8 ft/sec, indicate of risk for recurrent falls   General Gait Details: Dec foot clearance bilaterally, and with inc distance noted slight R knee instability. Attempted gait without RW, with which pt staggered L>R, and downward gaze. With RW, noted inc gait speed, posture, and no LOB.  Stairs            Wheelchair Mobility     Tilt Bed    Modified Rankin (Stroke Patients Only) Modified Rankin (Stroke Patients Only) Pre-Morbid Rankin Score: No symptoms Modified Rankin: Moderately severe disability     Balance Overall balance assessment: Needs assistance Sitting-balance support: Bilateral upper extremity supported, Single extremity supported, Feet supported Sitting balance-Leahy Scale: Poor Sitting balance - Comments: sitting EOB   Standing balance support: No upper extremity supported, Bilateral upper extremity supported, During functional activity Standing balance-Leahy Scale: Fair Standing balance comment: unsteadiness, but no overt LOB without AD  Pertinent Vitals/Pain Pain Assessment Pain Assessment: No/denies pain Pain Intervention(s): Monitored during session    Home Living Family/patient expects to be discharged to:: Private residence Living Arrangements: Alone Available Help at Discharge: Family;Available 24 hours/day Type of Home: House Home Access: Stairs to enter Entrance Stairs-Rails: Can reach both Entrance Stairs-Number of Steps: 4-5 front entrance with rails, 1 step back entrance (usual entrance) no rail   Home Layout: One level Home Equipment: Shower seat;Grab bars - tub/shower;Hand held shower head      Prior Function Prior Level of Function : Independent/Modified Independent;Driving;Working/employed             Mobility Comments: No AD 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 Independent in ADL, IADL, working, driving     Extremity/Trunk Assessment   Upper Extremity Assessment Upper Extremity Assessment: Defer to OT evaluation    Lower Extremity Assessment Lower Extremity Assessment: Generalized weakness    Cervical / Trunk Assessment Cervical / Trunk Assessment: Normal  Communication   Communication Communication: Impaired Factors Affecting Communication: Reduced clarity of speech;Difficulty expressing self    Cognition Arousal: Lethargic, Alert Behavior During Therapy: Flat affect   PT - Cognitive impairments: Problem solving, Attention, Orientation   Orientation impairments: Situation, Time                   PT - Cognition Comments: Pt unaware of Thanksgiving holiday approaching, and unable to state why she was readmitted to hospital. Overall flat affect and required repeat VC to open eyes in bed and with gait. Following commands: Impaired Following commands impaired: Follows multi-step commands with increased time     Cueing Cueing Techniques: Verbal cues     General Comments General comments (skin integrity, edema, etc.): Noting slow mentation and general  lethargy throughout session. Required inc time for yes/no questions,    Exercises     Assessment/Plan    PT Assessment Patient needs continued PT services  PT Problem List Decreased strength;Decreased activity tolerance;Decreased balance;Decreased mobility;Decreased coordination;Decreased cognition;Decreased knowledge of use of DME;Decreased safety awareness;Decreased knowledge of precautions       PT Treatment Interventions DME instruction;Gait training;Stair training;Functional mobility training;Therapeutic activities;Therapeutic exercise;Balance training;Neuromuscular re-education;Cognitive remediation;Patient/family education;Manual techniques;Modalities    PT Goals (Current goals can be found in the Care Plan section)  Acute Rehab PT Goals Patient Stated Goal: to be independent PT Goal Formulation: With patient/family Time For Goal Achievement: 09/20/24 Potential to Achieve Goals: Good    Frequency Min 3X/week     Co-evaluation               AM-PAC PT 6 Clicks Mobility  Outcome Measure Help needed turning from your back to your side while in a flat bed without using bedrails?: A Little Help needed moving from lying on your back to sitting on the side of a flat bed without using bedrails?: A Little Help needed moving to and from a bed to a chair (including a wheelchair)?: A Little Help needed standing up from a chair using your arms (e.g., wheelchair or bedside chair)?: A Little Help needed to walk in hospital room?: A Little Help needed climbing 3-5 steps with a railing? : A Lot 6 Click Score: 17    End of Session Equipment Utilized During Treatment: Gait belt Activity Tolerance: Patient tolerated treatment well;Patient limited by fatigue Patient left: in bed;with bed alarm set;with call bell/phone within reach Nurse Communication: Mobility status PT Visit Diagnosis: Unsteadiness on feet (R26.81);Other abnormalities of gait and mobility (R26.89);Difficulty in  walking,  not elsewhere classified (R26.2);Other symptoms and signs involving the nervous system (R29.898)    Time: 9159-9098 PT Time Calculation (min) (ACUTE ONLY): 21 min   Charges:   PT Evaluation $PT Eval Low Complexity: 1 Low   PT General Charges $$ ACUTE PT VISIT: 1 Visit         Len Azeez, SPT   Kyson Kupper 09/06/2024, 10:26 AM

## 2024-09-06 NOTE — Plan of Care (Signed)
 Pt had fever and mild headache. MD was notified.Refuse to take Tylenol  for headache and fever, said that she does not want to take anything for her fever and headache. Education and counseling done though refuses to take tylenol , ice pack applied on B/L axilla and groin. MD was notified. Allow to take her rest as per her comfort.  Problem: Education: Goal: Knowledge of General Education information will improve Description: Including pain rating scale, medication(s)/side effects and non-pharmacologic comfort measures Outcome: Progressing   Problem: Clinical Measurements: Goal: Ability to maintain clinical measurements within normal limits will improve Outcome: Progressing   Problem: Elimination: Goal: Will not experience complications related to urinary retention Outcome: Progressing   Problem: Pain Managment: Goal: General experience of comfort will improve and/or be controlled Outcome: Progressing   Problem: Education: Goal: Knowledge of secondary prevention will improve (MUST DOCUMENT ALL) Outcome: Progressing   Problem: Ischemic Stroke/TIA Tissue Perfusion: Goal: Complications of ischemic stroke/TIA will be minimized Outcome: Progressing   Problem: Education: Goal: Knowledge of patient specific risk factors will improve (DELETE if not current risk factor) Outcome: Progressing   Problem: Ischemic Stroke/TIA Tissue Perfusion: Goal: Complications of ischemic stroke/TIA will be minimized Outcome: Progressing

## 2024-09-07 DIAGNOSIS — I639 Cerebral infarction, unspecified: Secondary | ICD-10-CM

## 2024-09-07 MED ORDER — SODIUM CHLORIDE 0.9 % IV SOLN
50.0000 mg | Freq: Two times a day (BID) | INTRAVENOUS | Status: DC
  Filled 2024-09-07: qty 5

## 2024-09-07 MED ORDER — LACOSAMIDE 50 MG PO TABS
50.0000 mg | ORAL_TABLET | Freq: Two times a day (BID) | ORAL | Status: DC
  Administered 2024-09-07 – 2024-09-08 (×3): 50 mg via ORAL
  Filled 2024-09-07 (×3): qty 1

## 2024-09-07 NOTE — Progress Notes (Signed)
 Physical Therapy Treatment Patient Details Name: Rhonda Cortez MRN: [id number] DOB: March 23, 1961 Today's Date: 09/07/2024   History of Present Illness Pt is a 63 y.o. F who was originally admitted for small L caudate IPH and subsequently discharged to CIR. Code stroke called on CIR 11/22 due to acute onset poor responsiveness, favored to be a seizure. MRI on 11/24 revealed acute infarcts involving L PCA and adjacent MCA/PCA watershed territories with mild cytotoxic edema. No seizures on EEG. PMH: HTN, CVA.    PT Comments  Pt received in sitting and agreeable to PT session. Pt appearing more alert today and verbalized a few sentences regarding the upcoming holidays, whereas most responses in previous sessions with this therapist were head nods yes/no. Noted orthostatic response upon standing today (see vitals flowsheet), with pt denying dizziness or lightheadedness throughout session. Pt with slight LOB with inc in standing, requiring minA to recover. Ambulated 20' with RW and CGA before returning to sitting. Noted adducted B LE with gait, requiring repeat VC for widening base of support with gait to better maintain balance. Recommending post-acute rehab >3hrs/day to improve tolerance for functional activity and independence. Acute PT to follow.       If plan is discharge home, recommend the following: A little help with walking and/or transfers;A little help with bathing/dressing/bathroom;Assistance with cooking/housework;Direct supervision/assist for medications management;Direct supervision/assist for financial management;Assist for transportation;Help with stairs or ramp for entrance   Can travel by private vehicle        Equipment Recommendations  Rolling walker (2 wheels);BSC/3in1    Recommendations for Other Services Rehab consult     Precautions / Restrictions Precautions Precautions: Fall Recall of Precautions/Restrictions: Impaired Precaution/Restrictions Comments: SBP <  160 Restrictions Weight Bearing Restrictions Per Provider Order: No     Mobility  Bed Mobility               General bed mobility comments: Received in and returned to sitting up in chair    Transfers Overall transfer level: Needs assistance Equipment used: Rolling walker (2 wheels) Transfers: Sit to/from Stand Sit to Stand: Contact guard assist           General transfer comment: CGA for power up, denies dizziness or lightheadedness    Ambulation/Gait Ambulation/Gait assistance: Contact guard assist, Min assist Gait Distance (Feet): 20 Feet Assistive device: Rolling walker (2 wheels) Gait Pattern/deviations: Narrow base of support, Staggering right, Shuffle, Decreased stride length, Step-to pattern Gait velocity: reduced Gait velocity interpretation: <1.8 ft/sec, indicate of risk for recurrent falls   General Gait Details: Pt with tendency to walk with LEs adducted, requiring repeat VC to widen base of support. Pt wiht one slight LOB, requiring minA to recover to upright. States she is unsure why she feels off balance.   Stairs             Wheelchair Mobility     Tilt Bed    Modified Rankin (Stroke Patients Only)       Balance Overall balance assessment: Needs assistance Sitting-balance support: Feet supported, No upper extremity supported Sitting balance-Leahy Scale: Fair Sitting balance - Comments: Sitting up in chair   Standing balance support: Bilateral upper extremity supported, During functional activity, Reliant on assistive device for balance Standing balance-Leahy Scale: Poor Standing balance comment: unsteadiness noted                            Communication Communication Communication: Impaired Factors Affecting Communication: Reduced clarity  of speech;Difficulty expressing self  Cognition Arousal: Alert Behavior During Therapy: Flat affect   PT - Cognitive impairments: Problem solving, Attention, Awareness,  Safety/Judgement                       PT - Cognition Comments: Pt with slight difficulty understanding deficits, but expressed understanding with explanation of orthostatic hypotension. Able to follow commands, but overall slow mentation noted. Noted first verbal responses to conversation from this therapist. Following commands: Impaired Following commands impaired: Follows one step commands with increased time, Follows multi-step commands with increased time    Cueing Cueing Techniques: Verbal cues  Exercises      General Comments General comments (skin integrity, edema, etc.): Noted orthostatic response in standing (see clinical impression). Pt denied dizziness or lightheadedness throughout.      Pertinent Vitals/Pain Pain Assessment Pain Assessment: Faces Faces Pain Scale: No hurt Pain Intervention(s): Monitored during session    Home Living                          Prior Function            PT Goals (current goals can now be found in the care plan section) Acute Rehab PT Goals PT Goal Formulation: With patient/family Time For Goal Achievement: 09/20/24 Potential to Achieve Goals: Good Progress towards PT goals: Progressing toward goals    Frequency    Min 3X/week      PT Plan      Co-evaluation              AM-PAC PT 6 Clicks Mobility   Outcome Measure  Help needed turning from your back to your side while in a flat bed without using bedrails?: A Little Help needed moving from lying on your back to sitting on the side of a flat bed without using bedrails?: A Little Help needed moving to and from a bed to a chair (including a wheelchair)?: A Little Help needed standing up from a chair using your arms (e.g., wheelchair or bedside chair)?: A Little Help needed to walk in hospital room?: A Little Help needed climbing 3-5 steps with a railing? : A Lot 6 Click Score: 17    End of Session Equipment Utilized During Treatment: Gait  belt Activity Tolerance: Patient tolerated treatment well Patient left: in chair;with call bell/phone within reach;with chair alarm set;with family/visitor present Nurse Communication: Mobility status PT Visit Diagnosis: Unsteadiness on feet (R26.81);Other abnormalities of gait and mobility (R26.89);Difficulty in walking, not elsewhere classified (R26.2);Other symptoms and signs involving the nervous system (R29.898)     Time: 9149-9086 PT Time Calculation (min) (ACUTE ONLY): 23 min  Charges:    $Therapeutic Activity: 23-37 mins PT General Charges $$ ACUTE PT VISIT: 1 Visit                     Niklaus Mamaril, SPT    Kseniya Grunden 09/07/2024, 11:01 AM

## 2024-09-07 NOTE — Progress Notes (Signed)
 Progress Note    Rhonda Cortez   FMW:[id number]  DOB: 1961/02/12  DOA: 09/04/2024     3 PCP: Lucius Krabbe, NP  Initial CC: Right sided rigidity/weakness and decreased level of responsiveness  Hospital Course: Rhonda Cortez is a 63 y.o. 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  Bleed was stable with serial scans and she was discharged to CIR on 09/02/2024.  Main symptoms on that admission were confusion and aphasia.  She was doing well in CIR then on 11/22 she developed right sided weakness/rigidity, decreased level of responsiveness and not following commands. Repeat CT head showed no acute abnormalities and resolving known prior ICH.  Given her decreased responsiveness/level of alertness and right sided rigidity, concern was for seizure in relation to underlying recent ICH. She was loaded with Keppra  and admitted back to the hospital for further workup and neurology evaluation. Workup was consistent with seizure associated from prior ICH.  She was transitioned from Keppra  to Vimpat  per neurology.  Interval History:  Declined taking Vimpat  overnight and had to be changed to IV.  Does not appear to be intentional but possibly related to some confusion making her decline.  Again encouraged her this morning to take her medications when offered.  Nieces are present bedside and also will continue encouraging her to take her medicines. If not approved for CIR, they plan to take her home with 24/7 care.  Assessment and Plan: * Acute CVA (cerebrovascular accident) [facility]) - Neurology concerned about orthostatic hypotension contributing to CVA from underlying stenosis.  Orthostatics on 11/25 are positive with 20 point SBP drop - Repeat MRI brain performed on 09/06/2024.  Showing acute infarcts involving left PCA and adjacent MCA/PCA watershed territories with  mild cytotoxic edema.  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 -No indication for antiplatelets given recent ICH -Stroke team aware -Patient already seen by PT and OT; will ask for formal SLP eval given stroke  - Has already had recent echo on 08/28/2024; deferred to neurology on need for repeat but likely not - A1c 5.8% -LDL 164.  Has been on Lipitor 40 mg daily - Aspirin  initiated per neurology  Seizure [facility]) - While in rehab after recent ICH, patient developed right sided weakness/rigidity, decreased responsiveness, not following commands.  Concern was for development of seizure in setting of recent ICH with intraventricular extension - No worsening of bleed noted on repeat CT head on 11/22 - Loaded with Keppra  on 11/22 and continued on Keppra  500 mg twice daily per neurology; transitioned to Vimpat  on 09/05/2024 per neurology. -No further EEG monitoring needed or recommended per neurology - tentative plan for d/c back to rehab if accepted/approved; PT/OT consulted  History of intracranial hemorrhage - Initially presented on 08/27/2024 with left caudate intracranial hemorrhage with intraventricular extension - Improved slowly and discharged to CIR on 11/20.  See seizure workup above  Chronic kidney disease, stage 3b (HCC) - patient has history of CKD3b. Baseline creat ~ 1.8 - 2, eGFR~ 30-35   Mixed hyperlipidemia - Last LDL 164 on 08/29/2024 - Continue Lipitor  History of stroke - Continue Lipitor  Essential (primary) hypertension - SBP goal less than 160 - continue norvasc  - PRN labetalol  and hydralazine   - Orthostatic blood pressures checked on 11/25 and does have 20  pt drop in SBP   Antimicrobials: None  DVT prophylaxis:  heparin  injection 5,000 Units Start: 09/06/24 2200 SCDs Start: 09/04/24 2046   Code Status:   Code Status: Full Code  Mobility  Assessment (Last 72 Hours)     Mobility Assessment     Row Name 09/07/24 1018 09/06/24 2000 09/06/24 1043 09/06/24 1001 09/06/24 0800   Does the patient have exclusion criteria? -- No- Perform mobility assessment -- -- No- Perform mobility assessment   What is the highest level of mobility based on the mobility assessment? Level 4 (Ambulates with assistance) - Balance while stepping forward/back - Complete Level 4 (Ambulates with assistance) - Balance while stepping forward/back - Complete Level 4 (Ambulates with assistance) - Balance while stepping forward/back - Complete Level 4 (Ambulates with assistance) - Balance while stepping forward/back - Complete Level 4 (Ambulates with assistance) - Balance while stepping forward/back - Complete   Is the above level different from baseline mobility prior to current illness? -- Yes - Recommend PT order -- -- Yes - Recommend PT order    Row Name 09/06/24 0001 09/05/24 2005 09/05/24 1100 09/05/24 0800 09/05/24 0345   Does the patient have exclusion criteria? No - Perform mobility assessment No - Perform mobility assessment -- No - Perform mobility assessment No - Perform mobility assessment   What is the highest level of mobility based on the mobility assessment? Level 4 (Ambulates with assistance) - Balance while stepping forward/back - Complete Level 4 (Ambulates with assistance) - Balance while stepping forward/back - Complete -- Level 2 (Chairfast) - Balance while sitting on edge of bed and cannot stand Level 4 (Ambulates with assistance) - Balance while stepping forward/back - Complete   Is the above level different from baseline mobility prior to current illness? -- -- -- Yes - Recommend PT order --    Row Name 09/05/24 0015 09/04/24 2000         Does the patient have exclusion criteria? No - Perform mobility assessment No - Perform mobility assessment      What is the highest level of mobility based on the mobility assessment? Level 4 (Ambulates  with assistance) - Balance while stepping forward/back - Complete Level 4 (Ambulates with assistance) - Balance while stepping forward/back - Complete         Diet: Diet Orders (From admission, onward)     Start     Ordered   09/04/24 2047  Diet Heart Room service appropriate? Yes; Fluid consistency: Thin  Diet effective now       Question Answer Comment  Room service appropriate? Yes   Fluid consistency: Thin      09/04/24 2053            Barriers to discharge: None Disposition Plan: Possibly CIR again.  If not approved, family wishes to take her home and will have 24/7 care HH orders placed: TBD Status is: Inpatient  Objective: Blood pressure (!) 147/91, pulse 97, temperature 98.6 F (37 C), resp. rate 16, height 5' 3 (1.6 m), SpO2 100%.  Examination:  Physical Exam Constitutional:      Comments: Lethargic appearing but awakens easily and able to answer most questions and follow commands easily  HENT:     Head: Normocephalic and atraumatic.     Mouth/Throat:     Mouth: Mucous membranes are moist.  Eyes:     Extraocular Movements: Extraocular movements intact.  Cardiovascular:     Rate and Rhythm: Normal rate and regular rhythm.  Pulmonary:  Effort: Pulmonary effort is normal. No respiratory distress.     Breath sounds: Normal breath sounds. No wheezing.  Abdominal:     General: Bowel sounds are normal. There is no distension.     Palpations: Abdomen is soft.     Tenderness: There is no abdominal tenderness.  Musculoskeletal:        General: Normal range of motion.     Cervical back: Normal range of motion and neck supple.  Skin:    General: Skin is warm and dry.  Neurological:     Comments: Alert and oriented x 3.  No obvious focal deficits.  Slowed mentation  Psychiatric:        Mood and Affect: Mood normal.      Consultants:  Neurology  Procedures:    Data Reviewed: No results found for this or any previous visit (from the past 24 hours).    I have reviewed pertinent nursing notes, vitals, labs, and images as necessary. I have ordered labwork to follow up on as indicated.  I have reviewed the last notes from staff over past 24 hours. I have discussed patient's care plan and test results with nursing staff, CM/SW, and other staff as appropriate.  Old records reviewed in assessment of this patient  Time spent: Greater than 50% of the 55 minute visit was spent in counseling/coordination of care for the patient as laid out in the A&P.   LOS: 3 days   Alm Apo, MD [facility] 09/07/2024, 11:11 AM

## 2024-09-07 NOTE — Progress Notes (Signed)
 Inpatient Rehab Admissions Coordinator:   Insurance auth for [employer] pending.  Will follow.   Reche Lowers, PT, DPT Admissions Coordinator [phone] 09/07/24 9:44 AM

## 2024-09-07 NOTE — Evaluation (Signed)
 Clinical/Bedside Swallow Evaluation Patient Details  Name: Rhonda Cortez MRN: [id number] Date of Birth: 10/05/1961  Today's Date: 09/07/2024 Time: SLP Start Time (ACUTE ONLY): 0931 SLP Stop Time (ACUTE ONLY): 0945 SLP Time Calculation (min) (ACUTE ONLY): 14 min  Past Medical History:  Past Medical History:  Diagnosis Date   Hypertension    Stroke [facility])    Past Surgical History:  Past Surgical History:  Procedure Laterality Date   OOPHORECTOMY     TUBAL LIGATION     HPI:  Patient is a 63 y.o. female who was originally admitted for small L caudate IPH and subsequently discharged to CIR. Code stroke called on CIR 11/22 due to acute onset poor responsiveness, favored to be a seizure. No seizures on EEG. CT 11/22 revealed no acute intracranial abnormality. MR 11/24 revealed acute infarcts in the Left PCA and adjacent MCA/PCA watershed territories with mild cytotoxic edema. No acute hemorrhagic transformation. PMH: HTN, CVA    Assessment / Plan / Recommendation  Clinical Impression  Patient is not currently presenting with clinical s/s of dysphagia as per this BSE. She was awake, alert, and had two nieces at bedside. Patient was finishing breakfast upon SLP arrival. SLP assessed patient's swallow via PO's of: thin liquids and mechanical soft solids. Swallow initiation was timely and no overt s/s aspiration observed with straw sips of thin liquids and bites of mechanical soft solids. Patient continues to present with flat affect. Patient nor family members shared any concerns or questions regarding patient's swallow. SLP recommending patient continue regular thin diet. SLP signing off at this time. SLP Visit Diagnosis: Dysphagia, unspecified (R13.10)    Aspiration Risk       Diet Recommendation Regular;Thin liquid    Liquid Administration via: Straw Medication Administration: Other (Comment) (as tolerated) Supervision: Patient able to self feed Compensations: Slow rate;Small  sips/bites Postural Changes: Seated upright at 90 degrees;Remain upright for at least 30 minutes after po intake    Other  Recommendations Oral Care Recommendations: Oral care BID     Assistance Recommended at Discharge    Functional Status Assessment Patient has had a recent decline in their functional status and demonstrates the ability to make significant improvements in function in a reasonable and predictable amount of time.  Frequency and Duration            Prognosis        Swallow Study   General Date of Onset: 09/07/24 HPI: Patient is a 63 y.o. female who was originally admitted for small L caudate IPH and subsequently discharged to CIR. Code stroke called on CIR 11/22 due to acute onset poor responsiveness, favored to be a seizure. No seizures on EEG. CT 11/22 revealed no acute intracranial abnormality. MR 11/24 revealed acute infarcts in the Left PCA and adjacent MCA/PCA watershed territories with mild cytotoxic edema. No acute hemorrhagic transformation. PMH: HTN, CVA Type of Study: Bedside Swallow Evaluation Diet Prior to this Study: Regular;Thin liquids (Level 0) Temperature Spikes Noted: No Respiratory Status: Room air History of Recent Intubation: No Behavior/Cognition: Alert;Cooperative;Pleasant mood Oral Cavity Assessment: Within Functional Limits Oral Care Completed by SLP: No Vision: Functional for self-feeding Self-Feeding Abilities: Able to feed self Patient Positioning: Upright in chair    Oral/Motor/Sensory Function Overall Oral Motor/Sensory Function: Within functional limits   Ice Chips     Thin Liquid Thin Liquid: Within functional limits Presentation: Self Fed;Straw    Nectar Thick     Honey Thick     Puree  Solid     Solid: Within functional limits Presentation: Self Fed;Spoon     Damien Hy  Graduate SLP Clinican

## 2024-09-07 NOTE — Plan of Care (Signed)
 At the beginning of the shift was confused, anxious and upset saying she was going home with her family.  Family visited and she calmed down and was cooperative.  No signs of distress.    Problem: Clinical Measurements: Goal: Respiratory complications will improve Outcome: Progressing   Problem: Clinical Measurements: Goal: Cardiovascular complication will be avoided Outcome: Progressing   Problem: Activity: Goal: Risk for activity intolerance will decrease Outcome: Progressing   Problem: Nutrition: Goal: Adequate nutrition will be maintained Outcome: Progressing   Problem: Coping: Goal: Level of anxiety will decrease Outcome: Progressing

## 2024-09-07 NOTE — Progress Notes (Addendum)
 STROKE TEAM PROGRESS NOTE   INTERIM HISTORY/SUBJECTIVE No family is at the bedside. Pt is lying in bed, sleepy, she has worked with PT this morning and walked 20 feet with walker. Checked orthostatic vitals showed no significant orthostatic hypotension.    CBC    Component Value Date/Time   WBC 13.0 (H) 09/05/2024 0431   RBC 4.76 09/05/2024 0431   HGB 14.2 09/05/2024 0431   HCT 41.5 09/05/2024 0431   PLT 301 09/05/2024 0431   MCV 87.2 09/05/2024 0431   MCH 29.8 09/05/2024 0431   MCHC 34.2 09/05/2024 0431   RDW 11.9 09/05/2024 0431   LYMPHSABS 3.8 09/03/2024 0544   MONOABS 1.2 (H) 09/03/2024 0544   EOSABS 0.0 09/03/2024 0544   BASOSABS 0.0 09/03/2024 0544    BMET    Component Value Date/Time   NA 136 09/05/2024 0431   K 3.8 09/05/2024 0431   CL 101 09/05/2024 0431   CO2 21 (L) 09/05/2024 0431   GLUCOSE 109 (H) 09/05/2024 0431   BUN 30 (H) 09/05/2024 0431   CREATININE 1.81 (H) 09/05/2024 0431   CALCIUM  9.2 09/05/2024 0431   EGFR 33.0 05/12/2024 0000   GFRNONAA 31 (L) 09/05/2024 0431    IMAGING past 24 hours No results found.   Vitals:   09/07/24 0010 09/07/24 0804 09/07/24 1114 09/07/24 1153  BP: (!) 142/91 (!) 147/91 (!) 144/89 (!) 145/96  Pulse: 81 97 92 86  Resp: 18 16 16 12   Temp: 100.2 F (37.9 C) 98.6 F (37 C) 98.4 F (36.9 C) 98.5 F (36.9 C)  TempSrc: Oral   Oral  SpO2: 100% 100% 99% 97%  Height:         PHYSICAL EXAM  Temp:  [98.4 F (36.9 C)-100.3 F (37.9 C)] 98.5 F (36.9 C) (11/25 1153) Pulse Rate:  [81-97] 86 (11/25 1153) Resp:  [12-18] 12 (11/25 1153) BP: (142-154)/(89-104) 145/96 (11/25 1153) SpO2:  [97 %-100 %] 97 % (11/25 1153)  General - Well nourished, well developed, in no apparent distress, sleepy.  Ophthalmologic - fundi not visualized due to noncooperation.  Cardiovascular - Regular rhythm and rate.  Neuro - drowsy sleepy, eyes open on voice but needed repetitive stimulation to keep eyes open, not able to answer  orientation questions due to sleepiness, intermittent word salad but able to name 2/2 (nose, ear) with delayed response and able to repeat, following most simple commands. No gaze palsy, tracking bilaterally, visual field full, no hemianopia. Slight facial droop. Tongue midline. Bilateral UEs 4/5, no drift. Bilaterally LEs 3/5. Sensation symmetrical bilaterally, b/l FTN intact but slow, gait not tested.     ASSESSMENT/PLAN  Ms. JASMEET MANTON is a 63 y.o. female with history of hx CKD and HTN initially admitted for small L caudate IPH and subsequently discharged to CIR. Stroke code was called on 11/22 for acute onset poor responsiveness in bathroom, concerning for seizure. No seizures on LTM EEG overnight. She has difficulty swallowing pills therefore keppra  changed to vimpat . MRI however, showed left PCA and MCA/PCA infarct.   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  Resolving left caudate and left lateral ventricular hemorrhage without new hemorrhage. Stable periventricular and subcortical white matter changes bilaterally, moderately advanced for age.  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 Moderate tandem stenoses  in the proximal right P2 segment and proximal right P3 segment. 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 MRI: Acute infarcts in the Left PCA and adjacent MCA/PCA watershed territories with mild cytotoxic edema. No acute hemorrhagic transformation. Slowing regressing prior left caudate and intraventricular hemorrhage, without progression from earlier this month. Borderline to mild ventriculomegaly and transependymal edema are stable. Numerous chronic microhemorrhages and mild superficial siderosis, but pattern currently favoring ordinary  small vessel disease over amyloid angiopathy. VTE prophylaxis - heparin  subcu No antithrombotic prior to admission, now on aspirin  81.  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  CT and MRI repeat showed stable hematoma.  MRA showed severe stenosis left M1 and M2.  Left fetal PCA P2 occlusion.  Mild to moderate stenosis mid basilar artery.  EF 50 to 65%.  Carotid Doppler unremarkable.  LDL 164, A1c 5.8.  Discharged to CIR Lipitor 40.  Hx of Stroke/TIA Stroke in 2014 - arm and leg weakness, difficulty walking. Was on ASA after stroke for a number of years and then off. No significant residual deficit  Hypertension Home meds:  Norvasc , valsartan  On home norvasc   Stable now 140s Avoid low BP Orthostatic vital today no significant hypotension BP goal less than 160 Long-term BP goal 130-150 given severe stenosis/occlusion of left M1 and P1   Hyperlipidemia Home meds:  pitavastatin  LDL 164, goal < 70 On atorvastatin  40mg   Continue statin on discharge  CKD 3b Cre 2.19--1.95--2.2--1.76--1.81 BMP monitoring Avoid nephro toxic agent  Other stroke risk factors   Other clinical issues Leukocytosis WBC 11.9--12.2--13.8--13.0  Hospital day # 3  Neurology will sign off. Please call with questions. Pt will follow up with stroke clinic NP at [facility] in about  4 weeks. Thanks for the consult.   Ary Cummins, MD PhD Stroke Neurology 09/07/2024 12:13 PM       To contact Stroke Continuity provider, please refer to [email or web address]. After hours, contact General Neurology

## 2024-09-07 NOTE — Plan of Care (Signed)
  Problem: Education: Goal: Knowledge of General Education information will improve Description: Including pain rating scale, medication(s)/side effects and non-pharmacologic comfort measures Outcome: Progressing   Problem: Health Behavior/Discharge Planning: Goal: Ability to manage health-related needs will improve Outcome: Progressing   Problem: Activity: Goal: Risk for activity intolerance will decrease Outcome: Progressing   Problem: Nutrition: Goal: Adequate nutrition will be maintained Outcome: Progressing   Problem: Coping: Goal: Level of anxiety will decrease Outcome: Progressing   Problem: Education: Goal: Knowledge of disease or condition will improve Outcome: Progressing   Problem: Ischemic Stroke/TIA Tissue Perfusion: Goal: Complications of ischemic stroke/TIA will be minimized Outcome: Progressing

## 2024-09-07 NOTE — Progress Notes (Signed)
 EOS  On call neuro & TR[facility] contacted overnight re: pt refusing all PO medications, intake, drinks, food. Pt did agree to IV medication. Vimpat  was changed to IV admin, & pt received.   PRN labetalol  given x1, effective

## 2024-09-07 NOTE — PMR Pre-admission (Signed)
 PMR Admission Coordinator Pre-Admission Assessment   Patient: Rhonda Cortez is an 63 y.o., female MRN: [id number] DOB: 09/01/1961 Height: 5' 3 (1.6 m) Weight: 58.8 kg   Insurance Information HMO:     PPO:      PCP:      IPA:      80/20:      OTHER:  PRIMARY: [employer]      Policy#: [id number]            Subscriber: pt CM Name: Tully      Phone#: [phone]     Fax#: [phone] Pre-Cert#: [id number] auth for CIR from [place] with [employer] for admit 11/25 with next review date 12/2.  Updates due to [place] at fax listed above.        Employer:  Benefits:  Phone #: [phone]     Name:  Eff. Date: 10/15/23     Deduct: $1250 ($0 met)      Out of Pocket Max: [phone] ($356.99 met)      Life Max: n/a CIR: $300/admit then  80%      SNF: 80% Outpatient:      Co-Pay: $52/visit Home Health: 80%      Co-Pay: 20% DME: 80%     Co-Pay: 20% Providers:  SECONDARY:       Policy#:       Phone#:    Artist:       Phone#:    The Engineer, Materials Information Summary" for patients in Inpatient Rehabilitation Facilities with attached "Privacy Act Statement-Health Care Records" was provided and verbally reviewed with: Patient and Family   Emergency Contact Information Contact Information       Name Relation Home Work Mobile    Weston Sister [phone]        Gorgas,Linc Son     [phone]    Dwane Ronelle Blades     [phone]         Other Contacts   None on File        Current Medical History  Patient Admitting Diagnosis: CVA    History of Present Illness: Pt is a 63 y/o female with PMH of HTN who presented to [facility] on 11/14 with AMS.  Per family she was supposed to arrive at a friend's home around 7pm that night but did not arrive.  When pt answered her phone around 3AM she was quite confused.  In ED head CT showed acute intracranial hemorrhage from the left caudate region with extension into the intraventricular region on the left.  Repeat head CT showed intraventricular  extension of hemorrhage.  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  Neurology recommended no antithrombotic 2/2 ICH . She was recommended for CIR and transferred on 11/20.  On 11/22 she was ambulating to the BR with supervision and while on the toilet her legs became rigid and she had minimal verbal output.  She was transferred to acute for further workup.  CT demonstrated a resolving left caudate and left lateral ventricular hemorrhage without new hemorrhage.  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  She was started on aspirin  but no DAPT due to recent IVH/ICH.  Concern for hypotension and long term SBP 130-150.  She was switched to vimpat  from keppra .  Therapy evaluations completed and  pt was recommended to return to CIR to complete the program.     Complete NIHSS TOTAL: 2  Patient's medical record from [facility] has been reviewed by the rehabilitation admission coordinator and physician.  Past Medical History  Past Medical History:  Diagnosis Date   Hypertension    Stroke [facility])     Has the patient had major surgery during 100 days prior to admission? No  Family History   family history includes Asthma in her son; Diabetes in her mother; Hypertension in her brother, father, and sister; Kidney disease in her father.  Current Medications  Current Facility-Administered Medications:    acetaminophen  (TYLENOL ) tablet 650 mg, 650 mg, Oral, Q4H PRN **OR** acetaminophen  (TYLENOL ) suppository 650 mg, 650 mg, Rectal, Q6H PRN, Girguis, David, MD   amLODipine  (NORVASC ) tablet 10 mg, 10 mg, Oral, Daily, Rathore, Vasundhra, MD, 10 mg at 09/07/24 1043   aspirin  EC tablet 81 mg, 81 mg, Oral, Daily, Jerri Pfeiffer, MD, 81 mg at 09/07/24 1043   atorvastatin  (LIPITOR)  tablet 40 mg, 40 mg, Oral, Daily, Rathore, Vasundhra, MD, 40 mg at 09/07/24 1043   heparin  injection 5,000 Units, 5,000 Units, Subcutaneous, Q8H, Jerri Pfeiffer, MD, 5,000 Units at 09/07/24 0544   hydrALAZINE  (APRESOLINE ) injection 10 mg, 10 mg, Intravenous, Q4H PRN, Patsy Lenis, MD   labetalol  (NORMODYNE ) injection 10 mg, 10 mg, Intravenous, Q2H PRN, Patsy Lenis, MD, 10 mg at 09/06/24 2309   [DISCONTINUED] lacosamide  (VIMPAT ) 50 mg in sodium chloride  0.9 % 25 mL IVPB, 50 mg, Intravenous, Q12H **OR** lacosamide  (VIMPAT ) tablet 50 mg, 50 mg, Oral, Q12H, Chen, Lydia D, RPH, 50 mg at 09/07/24 1043   melatonin tablet 5 mg, 5 mg, Oral, QHS PRN, Sundil, Subrina, MD  Patients Current Diet:  Diet Order             Diet Heart Room service appropriate? Yes; Fluid consistency: Thin  Diet effective now                   Precautions / Restrictions Precautions Precautions: Fall Precaution/Restrictions Comments: SBP < 160 Restrictions Weight Bearing Restrictions Per Provider Order: No   Has the patient had 2 or more falls or a fall with injury in the past year? No  Prior Activity Level Community (5-7x/wk): fully independent, no DME, driving, working  Prior Functional Level Self Care: Did the patient need help bathing, dressing, using the toilet or eating? Independent  Indoor Mobility: Did the patient need assistance with walking from room to room (with or without device)? Independent  Stairs: Did the patient need assistance with internal or external stairs (with or without device)? Independent  Functional Cognition: Did the patient need help planning regular tasks such as shopping or remembering to take medications? Independent  Patient Information Are you of Hispanic, Latino/a,or Spanish origin?: A. No, not of Hispanic, Latino/a, or Spanish origin What is your race?: B. Black or African American Do you need or want an interpreter to communicate with a doctor or health care staff?: 0.  No Patient information obtained via proxy : per chart  Patient's Response To:  Health Literacy and Transportation Is the patient able to respond to health literacy and transportation needs?: Yes Health Literacy - How often do you need to have someone help you when you read instructions, pamphlets, or other written material from your doctor or pharmacy?: Patient unable to respond In the past 12 months, has lack of transportation kept you from medical appointments or from getting medications?: No  In the past 12 months, has lack of transportation kept you from meetings, work, or from getting things needed for daily living?: No Higher Education Careers Adviser obtained via proxy: per chart  Journalist, Newspaper / Equipment Home Equipment: Shower seat, Grab bars - tub/shower, Hand held shower head  Prior Device Use: Indicate devices/aids used by the patient prior to current illness, exacerbation or injury? None of the above  Current Functional Level Cognition  Orientation Level: Oriented to person, Disoriented to place, Disoriented to time, Disoriented to situation    Extremity Assessment (includes Sensation/Coordination)  Upper Extremity Assessment: Generalized weakness, RUE deficits/detail, LUE deficits/detail RUE Deficits / Details: per chart review, when admissed 11/14, pt reports prior CVA resulted in R sided weakness, overall WFL during session, 4/5 push/shoulder flexion. mildly decr coordination with finger to nose bilaterally with mild undershooting LUE Deficits / Details: mildly decr coordination with finger to nose bilaterally with mild undershooting  Lower Extremity Assessment: Defer to PT evaluation    ADLs  Overall ADL's : Needs assistance/impaired Eating/Feeding: Set up, Sitting Grooming: Minimal assistance, Standing Upper Body Bathing: Set up, Sitting Lower Body Bathing: Minimal assistance, Sitting/lateral leans, Sit to/from stand Upper Body Dressing : Set up,  Sitting Lower Body Dressing: Minimal assistance, Sitting/lateral leans, Sit to/from stand Toilet Transfer: Minimal assistance, Rolling walker (2 wheels) Toileting- Clothing Manipulation and Hygiene: Contact guard assist, Sitting/lateral lean Functional mobility during ADLs: Minimal assistance, Rolling walker (2 wheels)    Mobility  Overal bed mobility: Needs Assistance Bed Mobility: Supine to Sit, Sit to Supine Supine to sit: Supervision Sit to supine: Supervision General bed mobility comments: Received in and returned to sitting up in chair    Transfers  Overall transfer level: Needs assistance Equipment used: Rolling walker (2 wheels) Transfers: Sit to/from Stand Sit to Stand: Contact guard assist General transfer comment: CGA for power up, denies dizziness or lightheadedness    Ambulation / Gait / Stairs / Wheelchair Mobility  Ambulation/Gait Ambulation/Gait assistance: Contact guard assist, Min assist Gait Distance (Feet): 20 Feet Assistive device: Rolling walker (2 wheels) Gait Pattern/deviations: Narrow base of support, Staggering right, Shuffle, Decreased stride length, Step-to pattern General Gait Details: Pt with tendency to walk with LEs adducted, requiring repeat VC to widen base of support. Pt wiht one slight LOB, requiring minA to recover to upright. States she is unsure why she feels off balance. Gait velocity: reduced Gait velocity interpretation: <1.8 ft/sec, indicate of risk for recurrent falls    Posture / Balance Dynamic Sitting Balance Sitting balance - Comments: Sitting up in chair Balance Overall balance assessment: Needs assistance Sitting-balance support: Feet supported, No upper extremity supported Sitting balance-Leahy Scale: Fair Sitting balance - Comments: Sitting up in chair Standing balance support: Bilateral upper extremity supported, During functional activity, Reliant on assistive device for balance Standing balance-Leahy Scale: Poor Standing  balance comment: unsteadiness noted    Special considerations/life events  N/A   Previous Home Environment (from acute therapy documentation) Living Arrangements: Alone  Lives With: Significant other Available Help at Discharge: Family, Available 24 hours/day Type of Home: House Home Layout: One level Home Access: Stairs to enter Entrance Stairs-Rails: Can reach both Entrance Stairs-Number of Steps: 4-5 front entrance with rails, 1 step back entrance (usual entrance) no rail Bathroom Shower/Tub: Psychologist, counselling, Engineer, Manufacturing Systems: Standard Bathroom Accessibility: Yes  Discharge Living Setting Plans for Discharge Living Setting: Patient's home, Lives with (comment) (family to come stay with her) Type of Home at Discharge: House Discharge Home Layout:  One level Discharge Home Access: Stairs to enter Entrance Stairs-Rails: Right, Left Entrance Stairs-Number of Steps: 8+ Discharge Bathroom Shower/Tub: Tub/shower unit Discharge Bathroom Toilet: Standard Discharge Bathroom Accessibility: Yes How Accessible: Accessible via walker Does the patient have any problems obtaining your medications?: No  Social/Family/Support Systems Patient Roles: Parent Contact Information: adult children Anticipated Caregiver: children and other family to rotate staying with her Anticipated Caregiver's Contact Information: Iris (sister) [phone]; Ronelle [phone] Ability/Limitations of Caregiver: none stated Caregiver Availability: 24/7 Discharge Plan Discussed with Primary Caregiver: Yes Is Caregiver In Agreement with Plan?: Yes Does Caregiver/Family have Issues with Lodging/Transportation while Pt is in Rehab?: No  Goals Patient/Family Goal for Rehab: PT/OT/SLP supervision Expected length of stay: 7-10 days Additional Information: Discharge plan: pt to return to her home, her children and family with rotate to stay with her at discharge to ensure she has 24/7 Pt/Family Agrees to  Admission and willing to participate: Yes Program Orientation Provided & Reviewed with Pt/Caregiver Including Roles  & Responsibilities: Yes  Decrease burden of Care through IP rehab admission: n/a  Possible need for SNF placement upon discharge: Not anticipated.  Plan for home to pt's home with her children rotating to provide her with 24/7 supervision.   Patient Condition: I have reviewed medical records from [facility], spoken with [facility] team, and patient and family member. I met with patient at the bedside for inpatient rehabilitation assessment.  Patient will benefit from ongoing PT, OT, and SLP, can actively participate in 3 hours of therapy a day 5 days of the week, and can make measurable gains during the admission.  Patient will also benefit from the coordinated team approach during an Inpatient Acute Rehabilitation admission.  The patient will receive intensive therapy as well as Rehabilitation physician, nursing, social worker, and care management interventions.  Due to bladder management, bowel management, safety, skin/wound care, disease management, medication administration, pain management, and patient education the patient requires 24 hour a day rehabilitation nursing.  The patient is currently min assist with mobility and basic ADLs.  Discharge setting and therapy post discharge at home with home health is anticipated.  Patient has agreed to participate in the Acute Inpatient Rehabilitation Program and will admit today.  Preadmission Screen Completed By:  Reche FORBES Lowers, PT, DPT 09/07/2024 2:59 PM ______________________________________________________________________   Discussed status with Dr. Lorilee on 09/08/24  at 11:05 AM  and received approval for admission today.  Admission Coordinator:  Caitlin E Warren, PT, DPT time 11:05 AM Pattricia 09/08/24    Assessment/Plan: Diagnosis: CVA Does the need for close, 24 hr/day Medical supervision in concert with the patient's rehab needs make  it unreasonable for this patient to be served in a less intensive setting? Yes Co-Morbidities requiring supervision/potential complications: HTN, pain, constipation, HLD, seizure Due to bladder management, bowel management, safety, skin/wound care, disease management, medication administration, pain management, and patient education, does the patient require 24 hr/day rehab nursing? Yes Does the patient require coordinated care of a physician, rehab nurse, PT, OT, and SLP to address physical and functional deficits in the context of the above medical diagnosis(es)? Yes Addressing deficits in the following areas: balance, endurance, locomotion, strength, transferring, bowel/bladder control, bathing, dressing, feeding, grooming, toileting, cognition, and psychosocial support Can the patient actively participate in an intensive therapy program of at least 3 hrs of therapy 5 days a week? Yes The potential for patient to make measurable gains while on inpatient rehab is excellent Anticipated functional outcomes upon discharge from inpatient rehab: supervision  PT, supervision OT, supervision SLP Estimated rehab length of stay to reach the above functional goals is: 10-14 days Anticipated discharge destination: Home 10. Overall Rehab/Functional Prognosis: excellent   MD Signature: Sven Elks, MD

## 2024-09-08 ENCOUNTER — Inpatient Hospital Stay (HOSPITAL_COMMUNITY)
Admission: AD | Admit: 2024-09-08 | Discharge: 2024-09-22 | DRG: 056 | Disposition: A | Discharge status: Home / Self Care | Source: Intra-hospital | Attending: Physical Medicine & Rehabilitation | Admitting: Physical Medicine & Rehabilitation

## 2024-09-08 ENCOUNTER — Encounter (HOSPITAL_COMMUNITY): Payer: Self-pay | Admitting: Physical Medicine & Rehabilitation

## 2024-09-08 ENCOUNTER — Other Ambulatory Visit: Payer: Self-pay

## 2024-09-08 ENCOUNTER — Inpatient Hospital Stay (HOSPITAL_COMMUNITY)

## 2024-09-08 DIAGNOSIS — F09 Unspecified mental disorder due to known physiological condition: Secondary | ICD-10-CM

## 2024-09-08 DIAGNOSIS — I639 Cerebral infarction, unspecified: Principal | ICD-10-CM

## 2024-09-08 DIAGNOSIS — I63532 Cerebral infarction due to unspecified occlusion or stenosis of left posterior cerebral artery: Principal | ICD-10-CM

## 2024-09-08 LAB — BASIC METABOLIC PANEL WITH GFR
Anion gap: 15 (ref 5–15)
BUN: 26 mg/dL — ABNORMAL HIGH (ref 8–23)
CO2: 18 mmol/L — ABNORMAL LOW (ref 22–32)
Calcium: 8.9 mg/dL (ref 8.9–10.3)
Chloride: 99 mmol/L (ref 98–111)
Creatinine, Ser: 1.98 mg/dL — ABNORMAL HIGH (ref 0.44–1.00)
GFR, Estimated: 28 mL/min — ABNORMAL LOW (ref >60)
Glucose, Bld: 159 mg/dL — ABNORMAL HIGH (ref 70–99)
Potassium: 4.2 mmol/L (ref 3.5–5.1)
Sodium: 132 mmol/L — ABNORMAL LOW (ref 135–145)

## 2024-09-08 LAB — CBC WITH DIFFERENTIAL/PLATELET
Abs Immature Granulocytes: 0.06 K/uL (ref 0.00–0.07)
Basophils Absolute: 0.1 K/uL (ref 0.0–0.1)
Basophils Relative: 0 %
Eosinophils Absolute: 0 K/uL (ref 0.0–0.5)
Eosinophils Relative: 0 %
HCT: 40.2 % (ref 36.0–46.0)
Hemoglobin: 14.1 g/dL (ref 12.0–15.0)
Immature Granulocytes: 0 %
Lymphocytes Relative: 20 %
Lymphs Abs: 3.1 K/uL (ref 0.7–4.0)
MCH: 29.7 pg (ref 26.0–34.0)
MCHC: 35.1 g/dL (ref 30.0–36.0)
MCV: 84.8 fL (ref 80.0–100.0)
Monocytes Absolute: 0.9 K/uL (ref 0.1–1.0)
Monocytes Relative: 6 %
Neutro Abs: 11.3 K/uL — ABNORMAL HIGH (ref 1.7–7.7)
Neutrophils Relative %: 74 %
Platelets: 417 K/uL — ABNORMAL HIGH (ref 150–400)
RBC: 4.74 MIL/uL (ref 3.87–5.11)
RDW: 11.6 % (ref 11.5–15.5)
WBC: 15.4 K/uL — ABNORMAL HIGH (ref 4.0–10.5)
nRBC: 0 % (ref 0.0–0.2)

## 2024-09-08 LAB — URINALYSIS, ROUTINE W REFLEX MICROSCOPIC
Bilirubin Urine: NEGATIVE
Glucose, UA: 50 mg/dL — AB
Ketones, ur: NEGATIVE mg/dL
Nitrite: NEGATIVE
Protein, ur: 100 mg/dL — AB
Specific Gravity, Urine: 1.019 (ref 1.005–1.030)
WBC, UA: >50 WBC/hpf (ref 0–5)
pH: 5 (ref 5.0–8.0)

## 2024-09-08 MED ORDER — HEPARIN SODIUM (PORCINE) 5000 UNIT/ML IJ SOLN
5000.0000 [IU] | Freq: Three times a day (TID) | INTRAMUSCULAR | Status: DC
  Administered 2024-09-08 – 2024-09-17 (×25): 5000 [IU] via SUBCUTANEOUS
  Filled 2024-09-08 (×26): qty 1

## 2024-09-08 MED ORDER — GUAIFENESIN-DM 100-10 MG/5ML PO SYRP: 5.0000 mL | ORAL_SOLUTION | Freq: Four times a day (QID) | ORAL | Status: DC | PRN

## 2024-09-08 MED ORDER — METOPROLOL SUCCINATE ER 25 MG PO TB24
12.5000 mg | ORAL_TABLET | Freq: Every day | ORAL | Status: DC
  Administered 2024-09-08 – 2024-09-21 (×14): 12.5 mg via ORAL
  Filled 2024-09-08 (×14): qty 1

## 2024-09-08 MED ORDER — MELATONIN 5 MG PO TABS: 5.0000 mg | ORAL_TABLET | Freq: Every evening | ORAL | Status: AC | PRN

## 2024-09-08 MED ORDER — TRAZODONE HCL 50 MG PO TABS
25.0000 mg | ORAL_TABLET | Freq: Every evening | ORAL | Status: DC | PRN
  Administered 2024-09-13 – 2024-09-19 (×3): 50 mg via ORAL
  Filled 2024-09-08 (×4): qty 1

## 2024-09-08 MED ORDER — PROCHLORPERAZINE EDISYLATE 10 MG/2ML IJ SOLN: 5.0000 mg | Freq: Four times a day (QID) | INTRAMUSCULAR | Status: DC | PRN

## 2024-09-08 MED ORDER — BISACODYL 10 MG RE SUPP
10.0000 mg | Freq: Every day | RECTAL | Status: DC | PRN
  Administered 2024-09-13: 10 mg via RECTAL
  Filled 2024-09-08: qty 1

## 2024-09-08 MED ORDER — CEPHALEXIN 250 MG PO CAPS
500.0000 mg | ORAL_CAPSULE | Freq: Two times a day (BID) | ORAL | Status: DC
  Administered 2024-09-08 – 2024-09-13 (×10): 500 mg via ORAL
  Filled 2024-09-08 (×10): qty 2

## 2024-09-08 MED ORDER — ATORVASTATIN CALCIUM 40 MG PO TABS
40.0000 mg | ORAL_TABLET | Freq: Every day | ORAL | Status: DC
  Administered 2024-09-09 – 2024-09-22 (×14): 40 mg via ORAL
  Filled 2024-09-08 (×3): qty 1
  Filled 2024-09-08: qty 4
  Filled 2024-09-08 (×6): qty 1
  Filled 2024-09-08: qty 4
  Filled 2024-09-08 (×3): qty 1

## 2024-09-08 MED ORDER — CALCIUM CARBONATE ANTACID 500 MG PO CHEW: 1.0000 | CHEWABLE_TABLET | ORAL | Status: DC | PRN

## 2024-09-08 MED ORDER — SODIUM CHLORIDE 0.9 % IV SOLN: INTRAVENOUS | Status: DC

## 2024-09-08 MED ORDER — PROCHLORPERAZINE MALEATE 5 MG PO TABS: 5.0000 mg | ORAL_TABLET | Freq: Four times a day (QID) | ORAL | Status: DC | PRN

## 2024-09-08 MED ORDER — ASPIRIN 81 MG PO TBEC
81.0000 mg | DELAYED_RELEASE_TABLET | Freq: Every day | ORAL | Status: DC
  Administered 2024-09-09 – 2024-09-22 (×14): 81 mg via ORAL
  Filled 2024-09-08 (×14): qty 1

## 2024-09-08 MED ORDER — LACOSAMIDE 50 MG PO TABS
50.0000 mg | ORAL_TABLET | Freq: Two times a day (BID) | ORAL | Status: DC
  Administered 2024-09-08 – 2024-09-22 (×28): 50 mg via ORAL
  Filled 2024-09-08 (×28): qty 1

## 2024-09-08 MED ORDER — ACETAMINOPHEN 325 MG PO TABS
325.0000 mg | ORAL_TABLET | ORAL | Status: DC | PRN
  Administered 2024-09-08 – 2024-09-14 (×5): 650 mg via ORAL
  Filled 2024-09-08 (×5): qty 2

## 2024-09-08 MED ORDER — SIMETHICONE 80 MG PO CHEW: 80.0000 mg | CHEWABLE_TABLET | Freq: Four times a day (QID) | ORAL | Status: DC | PRN

## 2024-09-08 MED ORDER — LACOSAMIDE 50 MG PO TABS: 50.0000 mg | ORAL_TABLET | Freq: Two times a day (BID) | ORAL | Status: DC

## 2024-09-08 MED ORDER — DIPHENHYDRAMINE HCL 25 MG PO CAPS: 25.0000 mg | ORAL_CAPSULE | Freq: Four times a day (QID) | ORAL | Status: DC | PRN

## 2024-09-08 MED ORDER — MILK AND MOLASSES ENEMA: 1.0000 | Freq: Every day | RECTAL | Status: DC | PRN

## 2024-09-08 MED ORDER — AMLODIPINE BESYLATE 10 MG PO TABS
10.0000 mg | ORAL_TABLET | Freq: Every day | ORAL | Status: DC
  Administered 2024-09-09 – 2024-09-13 (×5): 10 mg via ORAL
  Filled 2024-09-08 (×5): qty 1

## 2024-09-08 MED ORDER — ASPIRIN 81 MG PO TBEC: 81.0000 mg | DELAYED_RELEASE_TABLET | Freq: Every day | ORAL | Status: DC

## 2024-09-08 MED ORDER — PROCHLORPERAZINE 25 MG RE SUPP: 12.5000 mg | Freq: Four times a day (QID) | RECTAL | Status: DC | PRN

## 2024-09-08 MED ORDER — INFLUENZA VIRUS VACC SPLIT PF (FLUZONE) 0.5 ML IM SUSY
0.5000 mL | PREFILLED_SYRINGE | INTRAMUSCULAR | Status: DC
  Filled 2024-09-08: qty 0.5

## 2024-09-08 NOTE — H&P (Shared)
 Physical Medicine and Rehabilitation Admission H&P    cc: Functional deficits due to CVA   HPI: Rhonda Cortez is a 63 year old female with PMHX of hypertension, HLD, and history of CVA in 2014 presented to [facility] on 08/27/2024.  Per chart review the patient was supposed to arrive at a friend's home around 7 PM that night but did not arrive.  When the patient answered her phone around 3 AM she was confused and had difficulty finding words.  In the ED a head CT showed acute intracranial hemorrhage from the left caudate region with extension of the intraventricular region on the left.  Neurology was consulted and the patient was admitted to the ICU.  A repeat CT head showed caudate head hemorrhage with intraventricular extension.  Patient underwent an MRI that confirmed intraventricular hemorrhage, no evidence of underlying mass. 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  Neurology recommended no antithrombotics due to ICH.  Patient was on aspirin  after stroke (2014) for a number of years and then off. VTE prophylaxis SCDs. 2D echo with EF 60 to 65%.  Hospital course complicated by AKI, ongoing headaches, and new fever of unknown origin.   The patient was admitted to CIR on 11/20 and was doing well. She experienced sudden onset of right sided weakness with rigidity, decreased responsiveness, and not following commands. Code stroke was activated and the patient was transferred back to acute care on 11/22 for stroke workup. CT head showed resolving left caudate and left lateral ventricular hemorrhage without new hemorrhage.  CTA head and neck showing high-grade near occlusive stenosis in the proximal right P2 segment and proximal right P3 segment, moderate stenosis in the mid basilar artery, mild narrowing at the distal left A2 segment, and minimal atherosclerotic calcification at the right carotid bifurcation and minimal  atherosclerotic changes in the proximal left ICA, both without significant stenosis.    Per neurology, patient was not a candidate for TNK or EVT due to no LVO noted on imaging and felt symptoms were concerning for seizure in the setting of recent ICH.  She was started on LTM EEG monitoring was given Keppra  1500 mg load,  maintenance Keppra  500 mg twice daily and transition to Vimpat  on 11/24.  EEG suggestive of cortical dysfunction arising from left temporal region likely secondary to underlying structural abnormality. No seizures or epileptiform discharges were seen. Repeat MRI brain was performed and showed acute infarcts involving the left PCA and adjacent MCA/PCA watershed territories with mild cytotoxic edema.  Neurology recommended aspirin  but no DAPT given ICH history.   Prior to arrival the patient was fully independent working and driving with no use of DME.  She lives alone in a 1 level home with 2 stairs to enter.  Patient currently requires contact-guard assist to min assist for mobility.Therapy evaluations completed due to patient decreased functional mobility was admitted for a comprehensive rehab program.      ROS      Past Medical History:  Diagnosis Date   Hypertension    Stroke [facility])    Past Surgical History:  Procedure Laterality Date   OOPHORECTOMY     TUBAL LIGATION     Family History  Problem Relation Age of Onset   Diabetes Mother    Kidney disease Father    Hypertension Father    Hypertension Sister    Hypertension Brother    Asthma  Son    Social History:  reports that she has never smoked. She has never used smokeless tobacco. She reports that she does not drink alcohol and does not use drugs. Allergies: No Known Allergies Medications Prior to Admission  Medication Sig Dispense Refill   acetaminophen  (TYLENOL ) 325 MG tablet Take 2 tablets (650 mg total) by mouth every 4 (four) hours as needed for mild pain (pain score 1-3), fever or headache (or temp >  37.5 C (99.5 F)).     amLODipine  (NORVASC ) 10 MG tablet Take 10 mg by mouth daily.     atorvastatin  (LIPITOR) 40 MG tablet Take 1 tablet (40 mg total) by mouth daily.     Cholecalciferol (VITAMIN D-3 PO) Take 1 capsule by mouth daily.     oxyCODONE  (OXY IR/ROXICODONE ) 5 MG immediate release tablet Take 1 tablet (5 mg total) by mouth every 6 (six) hours as needed for moderate pain (pain score 4-6) or severe pain (pain score 7-10).     pantoprazole  (PROTONIX ) 40 MG tablet Take 1 tablet (40 mg total) by mouth at bedtime.     polyethylene glycol (MIRALAX ) 17 g packet Take 17 g by mouth daily as needed for moderate constipation or severe constipation.     senna-docusate (SENOKOT-S) 8.6-50 MG tablet Take 1 tablet by mouth 2 (two) times daily.     triamcinolone  ointment (KENALOG ) 0.1 % Apply 1 Application topically 2 (two) times daily as needed (skin irritation).     [Paused] valsartan  (DIOVAN ) 80 MG tablet Take 1 tablet (80 mg total) by mouth daily. (Patient taking differently: Take 80 mg by mouth at bedtime.) 30 tablet 2      Home: Home Living Family/patient expects to be discharged to:: Private residence Living Arrangements: Alone Available Help at Discharge: Family, Available 24 hours/day Type of Home: House Home Access: Stairs to enter [employer] of Steps: 4-5 front entrance with rails, 1 step back entrance (usual entrance) no rail Entrance Stairs-Rails: Can reach both Home Layout: One level Bathroom Shower/Tub: Psychologist, counselling, Engineer, [employer]: Standard Bathroom Accessibility: Yes Home Equipment: Shower seat, Grab bars - tub/shower, Hand held shower head  Lives With: Significant other   Functional History: Prior Function Prior Level of Function : Independent/Modified Independent, Driving, Working/employed Mobility Comments: No AD ADLs Comments: 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 Independent in ADL, IADL, working, driving  Functional Status:  Mobility: Bed  Mobility Overal bed mobility: Needs Assistance Bed Mobility: Supine to Sit Supine to sit: Supervision Sit to supine: Supervision General bed mobility comments: cues to initiate Transfers Overall transfer level: Needs assistance Equipment used: Rolling walker (2 wheels) Transfers: Sit to/from Stand Sit to Stand: Contact guard assist General transfer comment: CGA to min assist with power up and transfers Ambulation/Gait Ambulation/Gait assistance: Contact guard assist, Min assist Gait Distance (Feet): 20 Feet Assistive device: Rolling walker (2 wheels) Gait Pattern/deviations: Narrow base of support, Staggering right, Shuffle, Decreased stride length, Step-to pattern General Gait Details: Pt with tendency to walk with LEs adducted, requiring repeat VC to widen base of support. Pt wiht one slight LOB, requiring minA to recover to upright. States she is unsure why she feels off balance. Gait velocity: reduced Gait velocity interpretation: <1.8 ft/sec, indicate of risk for recurrent falls    ADL: ADL Overall ADL's : Needs assistance/impaired Eating/Feeding: Set up, Sitting Grooming: Wash/dry hands, Wash/dry face, Oral care, Contact guard assist, Cueing for sequencing, Standing Grooming Details (indicate cue type and reason): at sink Upper Body Bathing: Set  up, Sitting Lower Body Bathing: Minimal assistance, Sitting/lateral leans, Sit to/from stand Upper Body Dressing : Set up, Sitting Lower Body Dressing: Supervision/safety, Cueing for sequencing, Sitting/lateral leans Lower Body Dressing Details (indicate cue type and reason): donned socks seated on EOB Toilet Transfer: Minimal assistance, Rolling walker (2 wheels) Toilet Transfer Details (indicate cue type and reason): cues for safety Toileting- Clothing Manipulation and Hygiene: Supervision/safety, Sitting/lateral lean Toileting - Clothing Manipulation Details (indicate cue type and reason): able to perform toilet hygiene  seated Functional mobility during ADLs: Minimal assistance, Rolling walker (2 wheels) General ADL Comments: cues for sequencing for self care, often appears she was waiting for commands  Cognition: Cognition Orientation Level: Oriented to person, Disoriented to place, Disoriented to time, Disoriented to situation Cognition Arousal: Alert Behavior During Therapy: Flat affect  Physical Exam: Blood pressure (!) 148/91, pulse 98, temperature 97.7 F (36.5 C), temperature source Oral, resp. rate 18, height 5' 3 (1.6 m), SpO2 100%. Physical Exam  No results found for this or any previous visit (from the past 48 hours). No results found.    Blood pressure (!) 148/91, pulse 98, temperature 97.7 F (36.5 C), temperature source Oral, resp. rate 18, height 5' 3 (1.6 m), SpO2 100%.  Medical Problem List and Plan: 1. Functional deficits secondary to ***  -patient may *** shower  -ELOS/Goals: ***  2.  Antithrombotics: -DVT/anticoagulation:  Mechanical: Sequential compression devices, below knee Bilateral lower extremities Pharmaceutical: Heparin   -antiplatelet therapy: Aspirin  81 mg --No DAPT due to recent ICH  3. Pain Management:  4. Mood/Behavior/Sleep: LCSW to follow for evaluation and support when available.   -antipsychotic agents: N/A   -sleep:   5. Neuropsych/cognition: This patient *** capable of making decisions on *** own behalf.  6. Skin/Wound Care: Routine pressure relief measures.   7. Fluids/Electrolytes/Nutrition: Monitor I&O and daily weights.  Follow up labs in a.m.    8.Stroke: left PCA and MCA/PCA watershed territories infarcts with cytotoxic edema, etiology: Likely large vessel disease, probably in the setting of syncope/orthostatic hypotension:   -Continue Asprin   9. Hx recent ICH: Continue Lipitor 40 mg daily   10. Hx stroke/TIA: Stroke 2014-no significant residual deficit. Continue statin.   11. Seizure: Episode on 11/22, EEG negative for seizure  activity. Loaded with Keppra , now on Vimpat  50 mg bid.   12. 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 Avoid low BP-Longterm BP goal 130-150 systolic given severe stenosis/occlusion of left MI and P1.   -Monitor orthostatic vitals especially with activity.   13.HTN: Continue on home regime- Norvac and Valsartan .  Same as above.   14. HLD: was on Pitavastatin  at home, now on Atorvastatin  40 mg daily.   15.  Fever of unk origin: Intermittent fevers continue ranging Tmax 100-100.9.  Repeat Chest xray/urinalysis/Blood cultures and venous doppler and CT abd/pelvis d/t recent ICH.     ***  Daphne LOISE Satterfield, NP 09/08/2024

## 2024-09-08 NOTE — Progress Notes (Signed)
 Inpatient Rehab Admissions Coordinator:    I have insurance approval and a bed available for pt to admit to CIR today. Dr. Cindy in agreement and [facility] aware.  I will notify pt/family and make arrangements.    Reche Lowers, PT, DPT Admissions Coordinator [phone] 09/08/24 11:03 AM  C

## 2024-09-08 NOTE — Progress Notes (Addendum)
 Lorilee Sven SQUIBB, MD  Physician Physical Medicine and Rehabilitation   PMR Pre-admission    Signed   Date of Service: 09/08/2024 11:05 AM  Related encounter: Admission (Current) from 09/04/2024 in [place] [facility]   Signed     Expand All Collapse All  PMR Admission Coordinator Pre-Admission Assessment   Patient: Rhonda Cortez is an 63 y.o., female MRN: [id number] DOB: 1961-08-16 Height: 5' 3 (1.6 m) Weight: 58.8 kg   Insurance Information HMO:     PPO:      PCP:      IPA:      80/20:      OTHER:  PRIMARY: [employer]      Policy#: [id number]            Subscriber: pt CM Name: Tully      Phone#: [phone]     Fax#: [phone] Pre-Cert#: [id number] auth for CIR from [place] with [employer] for admit 11/25 with next review date 12/2.  Updates due to [place] at fax listed above.        Employer:  Benefits:  Phone #: [phone]     Name:  Eff. Date: 10/15/23     Deduct: $1250 ($0 met)      Out of Pocket Max: [phone] ($356.99 met)      Life Max: n/a CIR: $300/admit then  80%      SNF: 80% Outpatient:      Co-Pay: $52/visit Home Health: 80%      Co-Pay: 20% DME: 80%     Co-Pay: 20% Providers:  SECONDARY:       Policy#:       Phone#:    Artist:       Phone#:    The Engineer[employer] Information Summary" for patients in Inpatient Rehabilitation Facilities with attached "Privacy Act Statement-Health Care Records" was provided and verbally reviewed with: Patient and Family   Emergency Contact Information Contact Information       Name Relation Home Work Mobile    [place] Sister [phone]        Pagel,Linc Son     [phone]    Dwane Ronelle Blades     [phone]         Other Contacts   None on File        Current Medical History  Patient Admitting Diagnosis: CVA    History of Present Illness: Pt is a 63 y/o female with PMH of HTN who presented to [facility] on 11/14 with AMS.  Per family she was supposed to arrive at a friend's home  around 7pm that night but did not arrive.  When pt answered her phone around 3AM she was quite confused.  In ED head CT showed acute intracranial hemorrhage from the left caudate region with extension into the intraventricular region on the left.  Repeat head CT showed intraventricular extension of hemorrhage.  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  Neurology recommended no antithrombotic 2/2 ICH . She was recommended for CIR and transferred on 11/20.  On 11/22 she was ambulating to the BR with supervision and while on the toilet her legs became rigid and she had minimal verbal output.  She was transferred to acute for further workup.  CT demonstrated a resolving left caudate and left lateral ventricular hemorrhage without new hemorrhage.  MRI showed acute infarcts in the left PCA and adjacent MCA watershed  territories with mild cytotoxic edema with no hemorrhagic transformation but with numerous chronic microhemorrhages favoring small vessel disease.  She was started on aspirin  but no DAPT due to recent IVH/ICH.  Concern for hypotension and long term SBP 130-150.  She was switched to vimpat  from keppra .  Therapy evaluations completed and pt was recommended to return to CIR to complete the program.     Complete NIHSS TOTAL: 2   Patient's medical record from [facility] has been reviewed by the rehabilitation admission coordinator and physician.   Past Medical History      Past Medical History:  Diagnosis Date   Hypertension     Stroke [facility])            Has the patient had major surgery during 100 days prior to admission? No   Family History   family history includes Asthma in her son; Diabetes in her mother; Hypertension in her brother, father, and sister; Kidney disease in her father.   Current Medications  Current Medications    Current Facility-Administered Medications:    acetaminophen  (TYLENOL ) tablet 650 mg, 650 mg,  Oral, Q4H PRN **OR** acetaminophen  (TYLENOL ) suppository 650 mg, 650 mg, Rectal, Q6H PRN, Patsy Lenis, MD   amLODipine  (NORVASC ) tablet 10 mg, 10 mg, Oral, Daily, Rathore, Vasundhra, MD, 10 mg at 09/07/24 1043   aspirin  EC tablet 81 mg, 81 mg, Oral, Daily, Jerri Pfeiffer, MD, 81 mg at 09/07/24 1043   atorvastatin  (LIPITOR) tablet 40 mg, 40 mg, Oral, Daily, Rathore, Vasundhra, MD, 40 mg at 09/07/24 1043   heparin  injection 5,000 Units, 5,000 Units, Subcutaneous, Q8H, Jerri Pfeiffer, MD, 5,000 Units at 09/07/24 0544   hydrALAZINE  (APRESOLINE ) injection 10 mg, 10 mg, Intravenous, Q4H PRN, Girguis, David, MD   labetalol  (NORMODYNE ) injection 10 mg, 10 mg, Intravenous, Q2H PRN, Girguis, David, MD, 10 mg at 09/06/24 2309   [DISCONTINUED] lacosamide  (VIMPAT ) 50 mg in sodium chloride  0.9 % 25 mL IVPB, 50 mg, Intravenous, Q12H **OR** lacosamide  (VIMPAT ) tablet 50 mg, 50 mg, Oral, Q12H, Chen, Lydia D, RPH, 50 mg at 09/07/24 1043   melatonin tablet 5 mg, 5 mg, Oral, QHS PRN, Sundil, Subrina, MD     Patients Current Diet:  Diet Order                  Diet Heart Room service appropriate? Yes; Fluid consistency: Thin  Diet effective now                         Precautions / Restrictions Precautions Precautions: Fall Precaution/Restrictions Comments: SBP < 160 Restrictions Weight Bearing Restrictions Per Provider Order: No    Has the patient had 2 or more falls or a fall with injury in the past year? No   Prior Activity Level Community (5-7x/wk): fully independent, no DME, driving, working   Prior Functional Level Self Care: Did the patient need help bathing, dressing, using the toilet or eating? Independent   Indoor Mobility: Did the patient need assistance with walking from room to room (with or without device)? Independent   Stairs: Did the patient need assistance with internal or external stairs (with or without device)? Independent   Functional Cognition: Did the patient need help  planning regular tasks such as shopping or remembering to take medications? Independent   Patient Information Are you of Hispanic, Latino/a,or Spanish origin?: A. No, not of Hispanic, Latino/a, or Spanish origin What is your race?: B. Black or African American Do you need  or want an interpreter to communicate with a doctor or health care staff?: 0. No Patient information obtained via proxy : per chart   Patient's Response To:  Health Literacy and Transportation Is the patient able to respond to health literacy and transportation needs?: No Health Literacy - How often do you need to have someone help you when you read instructions, pamphlets, or other written material from your doctor or pharmacy?: Patient unable to respond In the past 12 months, has lack of transportation kept you from medical appointments or from getting medications?: No In the past 12 months, has lack of transportation kept you from meetings, work, or from getting things needed for daily living?: No Higher Education Careers Adviser obtained via proxy: per chart   Journalist, Newspaper / Equipment Home Equipment: Shower seat, Grab bars - tub/shower, Hand held shower head   Prior Device Use: Indicate devices/aids used by the patient prior to current illness, exacerbation or injury? None of the above   Current Functional Level Cognition   Orientation Level: Oriented to person, Disoriented to place, Disoriented to time, Disoriented to situation    Extremity Assessment (includes Sensation/Coordination)   Upper Extremity Assessment: Generalized weakness, RUE deficits/detail, LUE deficits/detail RUE Deficits / Details: per chart review, when admissed 11/14, pt reports prior CVA resulted in R sided weakness, overall WFL during session, 4/5 push/shoulder flexion. mildly decr coordination with finger to nose bilaterally with mild undershooting LUE Deficits / Details: mildly decr coordination with finger to nose bilaterally  with mild undershooting  Lower Extremity Assessment: Defer to PT evaluation     ADLs   Overall ADL's : Needs assistance/impaired Eating/Feeding: Set up, Sitting Grooming: Minimal assistance, Standing Upper Body Bathing: Set up, Sitting Lower Body Bathing: Minimal assistance, Sitting/lateral leans, Sit to/from stand Upper Body Dressing : Set up, Sitting Lower Body Dressing: Minimal assistance, Sitting/lateral leans, Sit to/from stand Toilet Transfer: Minimal assistance, Rolling walker (2 wheels) Toileting- Clothing Manipulation and Hygiene: Contact guard assist, Sitting/lateral lean Functional mobility during ADLs: Minimal assistance, Rolling walker (2 wheels)     Mobility   Overal bed mobility: Needs Assistance Bed Mobility: Supine to Sit, Sit to Supine Supine to sit: Supervision Sit to supine: Supervision General bed mobility comments: Received in and returned to sitting up in chair     Transfers   Overall transfer level: Needs assistance Equipment used: Rolling walker (2 wheels) Transfers: Sit to/from Stand Sit to Stand: Contact guard assist General transfer comment: CGA for power up, denies dizziness or lightheadedness     Ambulation / Gait / Stairs / Wheelchair Mobility   Ambulation/Gait Ambulation/Gait assistance: Contact guard assist, Min assist Gait Distance (Feet): 20 Feet Assistive device: Rolling walker (2 wheels) Gait Pattern/deviations: Narrow base of support, Staggering right, Shuffle, Decreased stride length, Step-to pattern General Gait Details: Pt with tendency to walk with LEs adducted, requiring repeat VC to widen base of support. Pt wiht one slight LOB, requiring minA to recover to upright. States she is unsure why she feels off balance. Gait velocity: reduced Gait velocity interpretation: <1.8 ft/sec, indicate of risk for recurrent falls     Posture / Balance Dynamic Sitting Balance Sitting balance - Comments: Sitting up in chair Balance Overall balance  assessment: Needs assistance Sitting-balance support: Feet supported, No upper extremity supported Sitting balance-Leahy Scale: Fair Sitting balance - Comments: Sitting up in chair Standing balance support: Bilateral upper extremity supported, During functional activity, Reliant on assistive device for balance Standing balance-Leahy Scale: Poor Standing balance comment: unsteadiness  noted     Special considerations/life events  N/A    Previous Home Environment (from acute therapy documentation) Living Arrangements: Alone  Lives With: Significant other Available Help at Discharge: Family, Available 24 hours/day Type of Home: House Home Layout: One level Home Access: Stairs to enter Entrance Stairs-Rails: Can reach both Entrance Stairs-Number of Steps: 4-5 front entrance with rails, 1 step back entrance (usual entrance) no rail Bathroom Shower/Tub: Psychologist, counselling, Armed Forces Operational Officer Accessibility: Yes   Discharge Living Setting Plans for Discharge Living Setting: Patient's home, Lives with (comment) (family to come stay with her) Type of Home at Discharge: House Discharge Home Layout: One level Discharge Home Access: Stairs to enter Entrance Stairs-Rails: Right, Left Entrance Stairs-Number of Steps: 8+ Discharge Bathroom Shower/Tub: Tub/shower unit Discharge Bathroom Toilet: Standard Discharge Bathroom Accessibility: Yes How Accessible: Accessible via walker Does the patient have any problems obtaining your medications?: No   Social/Family/Support Systems Patient Roles: Parent Contact Information: adult children Anticipated Caregiver: children and other family to rotate staying with her Anticipated Caregiver's Contact Information: Iris (sister) [phone]; Ronelle [phone] Ability/Limitations of Caregiver: none stated Caregiver Availability: 24/7 Discharge Plan Discussed with Primary Caregiver: Yes Is Caregiver In Agreement with Plan?:  Yes Does Caregiver/Family have Issues with Lodging/Transportation while Pt is in Rehab?: No   Goals Patient/Family Goal for Rehab: PT/OT/SLP supervision Expected length of stay: 7-10 days Additional Information: Discharge plan: pt to return to her home, her children and family with rotate to stay with her at discharge to ensure she has 24/7 Pt/Family Agrees to Admission and willing to participate: Yes Program Orientation Provided & Reviewed with Pt/Caregiver Including Roles  & Responsibilities: Yes   Decrease burden of Care through IP rehab admission: n/a   Possible need for SNF placement upon discharge: Not anticipated.  Plan for home to pt's home with her children rotating to provide her with 24/7 supervision.    Patient Condition: I have reviewed medical records from [facility], spoken with [facility] team, and patient and family member. I met with patient at the bedside for inpatient rehabilitation assessment.  Patient will benefit from ongoing PT, OT, and SLP, can actively participate in 3 hours of therapy a day 5 days of the week, and can make measurable gains during the admission.  Patient will also benefit from the coordinated team approach during an Inpatient Acute Rehabilitation admission.  The patient will receive intensive therapy as well as Rehabilitation physician, nursing, social worker, and care management interventions.  Due to bladder management, bowel management, safety, skin/wound care, disease management, medication administration, pain management, and patient education the patient requires 24 hour a day rehabilitation nursing.  The patient is currently min assist with mobility and basic ADLs.  Discharge setting and therapy post discharge at home with home health is anticipated.  Patient has agreed to participate in the Acute Inpatient Rehabilitation Program and will admit today.   Preadmission Screen Completed By:  Reche FORBES Lowers, PT, DPT 09/07/2024 2:59  PM ______________________________________________________________________   Discussed status with Dr. Lorilee on 09/08/24  at 11:05 AM  and received approval for admission today.   Admission Coordinator:  Elle Vezina E Geneviene Tesch, PT, DPT time 11:05 AM Pattricia 09/08/24     Assessment/Plan: Diagnosis: CVA Does the need for close, 24 hr/day Medical supervision in concert with the patient's rehab needs make it unreasonable for this patient to be served in a less intensive setting? Yes Co-Morbidities requiring supervision/potential complications: HTN, pain, constipation, HLD, seizure Due to bladder  management, bowel management, safety, skin/wound care, disease management, medication administration, pain management, and patient education, does the patient require 24 hr/day rehab nursing? Yes Does the patient require coordinated care of a physician, rehab nurse, PT, OT, and SLP to address physical and functional deficits in the context of the above medical diagnosis(es)? Yes Addressing deficits in the following areas: balance, endurance, locomotion, strength, transferring, bowel/bladder control, bathing, dressing, feeding, grooming, toileting, cognition, and psychosocial support Can the patient actively participate in an intensive therapy program of at least 3 hrs of therapy 5 days a week? Yes The potential for patient to make measurable gains while on inpatient rehab is excellent Anticipated functional outcomes upon discharge from inpatient rehab: supervision PT, supervision OT, supervision SLP Estimated rehab length of stay to reach the above functional goals is: 10-14 days Anticipated discharge destination: Home 10. Overall Rehab/Functional Prognosis: excellent     MD Signature: Sven Elks, MD          Revision History  Date/Time User Provider Type Action  09/08/2024 11:25 AM Elks, Sven SQUIBB, MD Physician Sign  09/08/2024 11:05 AM Butler Reche BRAVO, PT Rehab Admission Coordinator Share   09/07/2024  3:01 PM Butler Reche BRAVO, PT Rehab Admission Coordinator Share   View Details Report

## 2024-09-08 NOTE — TOC Transition Note (Signed)
 Transition of Care [facility]) - Discharge Note   Patient Details  Name: Rhonda Cortez MRN: [id number] Date of Birth: 1961-04-23  [facility]) CM/SW Contact:  Almarie CHRISTELLA Goodie, LCSW Phone Number: 09/08/2024, 11:03 AM   Clinical Narrative:   CSW updated by Rehab Admissions that patient discharging to CIR today, no further inpatient care management needs.    Final next level of care: IP Rehab Facility Barriers to Discharge: Barriers Resolved   Patient Goals and CMS Choice            Discharge Placement                       Discharge Plan and Services Additional resources added to the After Visit Summary for                                       Social Drivers of Health (SDOH) Interventions SDOH Screenings   Food Insecurity: No Food Insecurity (08/30/2024)  Housing: Low Risk  (08/30/2024)  Transportation Needs: No Transportation Needs (08/30/2024)  Utilities: Patient Unable To Answer (09/05/2024)  Depression (PHQ2-9): Low Risk  (02/17/2024)  Financial Resource Strain: Low Risk  (02/17/2024)  Physical Activity: Insufficiently Active (02/17/2024)  Social Connections: Socially Isolated (02/17/2024)  Stress: No Stress Concern Present (02/17/2024)  Tobacco Use: Low Risk  (08/27/2024)     Readmission Risk Interventions     No data to display

## 2024-09-08 NOTE — Progress Notes (Signed)
 On call updated re: Headache unrelieved by Tylenol  650mg  PRN q4h No new orders at this time

## 2024-09-08 NOTE — Discharge Summary (Signed)
 Physician Discharge Summary   Patient: Rhonda Cortez MRN: [id number] DOB: [birth year]  Admit date:     09/04/2024  Discharge date: 09/08/24  Discharge Physician: Garnette Pelt   PCP: Lucius Krabbe, NP   Recommendations at discharge:    Follow up with PCP in 1-2 weeks Follow up with Neurology as scheduled  Discharge Diagnoses: Principal Problem:   Acute CVA (cerebrovascular accident) [facility]) Active Problems:   Seizure (HCC)   History of intracranial hemorrhage   Essential (primary) hypertension   History of stroke   Mixed hyperlipidemia   Chronic kidney disease, stage 3b (HCC)  Resolved Problems:   * No resolved hospital problems. *  Hospital Course: Rhonda Cortez is a 63 y.o. 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  Bleed was stable with serial scans and she was discharged to CIR on 09/02/2024.  Main symptoms on that admission were confusion and aphasia.  She was doing well in CIR then on 11/22 she developed right sided weakness/rigidity, decreased level of responsiveness and not following commands. Repeat CT head showed no acute abnormalities and resolving known prior ICH.  Given her decreased responsiveness/level of alertness and right sided rigidity, concern was for seizure in relation to underlying recent ICH. She was loaded with Keppra  and admitted back to the hospital for further workup and neurology evaluation. Workup was consistent with seizure associated from prior ICH.  She was transitioned from Keppra  to Vimpat  per neurology.  Assessment and Plan:  Acute CVA (cerebrovascular accident) [facility]) - Neurology concerned about orthostatic hypotension contributing to CVA from underlying stenosis.  Orthostatics on 11/25 are positive with 20 point SBP drop - Repeat MRI brain performed on 09/06/2024.  Showing acute infarcts involving left PCA  and adjacent MCA/PCA watershed territories with mild cytotoxic edema.  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 -Neurology recs for ASA, no DAPT given ICH hx -Stroke team aware -Patient already seen by PT and OT; will ask for formal SLP eval given stroke  - Has already had recent echo on 08/28/2024; - A1c 5.8% -LDL 164.  Has been on Lipitor 40 mg daily   Seizure (HCC) - While in rehab after recent ICH, patient developed right sided weakness/rigidity, decreased responsiveness, not following commands.  Concern was for development of seizure in setting of recent ICH with intraventricular extension - No worsening of bleed noted on repeat CT head on 11/22 - Loaded with Keppra  on 11/22 and continued on Keppra  500 mg twice daily per neurology; transitioned to Vimpat  on 09/05/2024 per neurology. -No further EEG monitoring needed or recommended per neurology - tentative plan for d/c back to rehab if accepted/approved; PT/OT consulted   History of intracranial hemorrhage - Initially presented on 08/27/2024 with left caudate intracranial hemorrhage with intraventricular extension - Improved slowly and discharged to CIR on 11/20.  See seizure workup above   Chronic kidney disease, stage 3b (HCC) - patient has history of CKD3b. Baseline creat ~ 1.8 - 2, eGFR~ 30-35     Mixed hyperlipidemia - Last LDL 164 on 08/29/2024 - Continue Lipitor   History of stroke - Continue Lipitor   Essential (primary) hypertension - SBP goal less than 160 - continue norvasc  - Orthostatic blood pressures checked on 11/25 and does have 20 pt drop in SBP  Consultants: Neurology Procedures performed:   Disposition: Rehabilitation facility Diet recommendation:  Cardiac diet DISCHARGE MEDICATION: Allergies as of 09/08/2024   No Known Allergies      Medication List     PAUSE taking these medications     valsartan  80 MG tablet Wait to take this until your doctor or other care provider tells you to start again. Commonly known as: DIOVAN  Take 1 tablet (80 mg total) by mouth daily. What changed: when to take this       STOP taking these medications    oxyCODONE  5 MG immediate release tablet Commonly known as: Oxy IR/ROXICODONE        TAKE these medications    acetaminophen  325 MG tablet Commonly known as: TYLENOL  Take 2 tablets (650 mg total) by mouth every 4 (four) hours as needed for mild pain (pain score 1-3), fever or headache (or temp > 37.5 C (99.5 F)).   amLODipine  10 MG tablet Commonly known as: NORVASC  Take 10 mg by mouth daily.   aspirin  EC 81 MG tablet Take 1 tablet (81 mg total) by mouth daily. Swallow whole. Start taking on: September 09, 2024   atorvastatin  40 MG tablet Commonly known as: LIPITOR Take 1 tablet (40 mg total) by mouth daily.   lacosamide  50 MG Tabs tablet Commonly known as: VIMPAT  Take 1 tablet (50 mg total) by mouth every 12 (twelve) hours.   melatonin 5 MG Tabs Take 1 tablet (5 mg total) by mouth at bedtime as needed.   pantoprazole  40 MG tablet Commonly known as: PROTONIX  Take 1 tablet (40 mg total) by mouth at bedtime.   polyethylene glycol 17 g packet Commonly known as: MiraLax  Take 17 g by mouth daily as needed for moderate constipation or severe constipation.   senna-docusate 8.6-50 MG tablet Commonly known as: Senokot-S Take 1 tablet by mouth 2 (two) times daily.   triamcinolone  ointment 0.1 % Commonly known as: KENALOG  Apply 1 Application topically 2 (two) times daily as needed (skin irritation).   VITAMIN D-3 PO Take 1 capsule by mouth daily.        Follow-up Information     [facility]. Schedule an appointment as soon as possible for a visit in 1 month(s).   Specialty: Neurology Why: stroke clinic Contact information: [address] [place]   [address] [phone]        Lucius Krabbe, NP Follow up in 2 week(s).   Specialty: Family Medicine Why: Hospital follow up Contact information: [address] [place] [address] [phone]                Discharge Exam: There were no vitals filed for this visit. General exam: Awake, laying in bed, in nad Respiratory system: Normal respiratory effort, no wheezing Cardiovascular system: regular rate, s1, s2 Gastrointestinal system: Soft, nondistended, positive BS Central nervous system: CN2-12 grossly intact, strength intact Extremities: Perfused, no clubbing Skin: Normal skin turgor, no notable skin lesions seen Psychiatry: Mood normal // no visual hallucinations   Condition at discharge: fair  The results of significant diagnostics from this hospitalization (including imaging, microbiology, ancillary and laboratory) are listed below for reference.   Imaging Studies: MR BRAIN WO CONTRAST Result Date: 09/06/2024 EXAM: MRI BRAIN WITHOUT CONTRAST 09/06/2024 11:47:55 AM TECHNIQUE: Multiplanar multisequence MRI of the head/brain was performed without the administration of intravenous contrast. COMPARISON: Head CT and CTA head and neck 09/04/2024. Brain MRI 08/29/2024. CLINICAL HISTORY: 63 year old female with left caudate hemorrhage extending  into the ventricular system and recurrent code stroke presentation 2 days ago. FINDINGS: BRAIN AND VENTRICLES: There is gyriform and nodular abnormal diffusion in the posterior left hemisphere affecting both the left PCA territory (including posterior left temporal lobe on series 2 image 21) and the posterior left MCA / PCA watershed (left parietal lobe series 3 image 10). These areas are restricted, and demonstrate gyriform T2 and FLAIR hyperintense cytotoxic edema. No evidence of hemorrhagic transformation. No other acute ischemia identified. Intraventricular blood, dominant in the left lateral ventricle, does not appear  progressed since the previous MRI. Residual left caudate intra axial blood products with expected evolution. SWI demonstrates multiple superimposed chronic microhemorrhages scattered in the bilateral deep gray nuclei, in the left deep cerebellar nucleus, and occasionally elsewhere in the cerebral and cerebellar hemispheres. Mild superimposed superficial siderosis also. The pattern more resembles ordinary chronic small vessel disease than amyloid angiopathy at this time. Blood product susceptibility on diffusion imaging. No significant midline shift. Borderline to mild ventriculomegaly and possible mild associated transependymal edema (series 6 image 17) are not progressed. The sella is unremarkable. Normal flow voids. Basilar cisterns remain patent. No increased intracranial mass effect. ORBITS: No acute abnormality. SINUSES AND MASTOIDS: Paranasal sinuses and mastoids are well aerated. BONES AND SOFT TISSUES: Normal marrow signal. No acute soft tissue abnormality. Stable and negative visible cervical spine and bone marrow signal. IMPRESSION: 1. Acute infarcts in the Left PCA and adjacent MCA/PCA watershed territories with mild cytotoxic edema. No acute hemorrhagic transformation. 2. Slowing regressing prior left caudate and intraventricular hemorrhage, without progression from earlier this month. Borderline to mild ventriculomegaly and transependymal edema are stable. 3. No significant intracranial mass effect, midline shift. 4. Numerous chronic microhemorrhages and mild superficial siderosis, but pattern currently favoring ordinary small vessel disease over amyloid angiopathy. Electronically signed by: Helayne Hurst MD 09/06/2024 11:57 AM EST RP Workstation: [id number]   Overnight EEG with video Result Date: 09/04/2024 Shelton Arlin KIDD, MD     09/06/2024  8:20 AM Patient Name: BETHANIA SCHLOTZHAUER MRN: [id number] Epilepsy Attending: Arlin KIDD Shelton Referring Physician/Provider: Judithe Rocky BROCKS, NP Duration: 09/04/2024  1557 to 09/05/2024 1234 Patient history: 63 y.o. female who presents with confusion and found to have a small head of the caudate hemorrhage. Again patient becoming unresponsive and not following commands. EEG to evaluate for seizure Level of alertness: Awake, asleep AEDs during EEG study: LEV Technical aspects: This EEG study was done with scalp electrodes positioned according to the 10-20 International system of electrode placement. Electrical activity was reviewed with band pass filter of 1-70Hz , sensitivity of 7 uV/mm, display speed of 109mm/sec with a 60Hz  notched filter applied as appropriate. EEG data were recorded continuously and digitally stored.  Video monitoring was available and reviewed as appropriate. Description: The posterior dominant rhythm consists of 8-9 Hz activity of moderate voltage (25-35 uV) seen predominantly in posterior head regions, symmetric and reactive to eye opening and eye closing. Sleep was characterized by vertex waves, sleep spindles (12 to 14 Hz), maximal frontocentral region. There is continuous 3 to 6 Hz theta-delta slowing in left temporal region. Hyperventilation and photic stimulation were not performed.   ABNORMALITY - Continuous slow, left temporal IMPRESSION: This study is suggestive of cortical dysfunction arising from left temporal region likely secondary to underlying structural abnormality. No seizures or epileptiform discharges were seen throughout the recording. Priyanka KIDD Shelton   CT ANGIO HEAD NECK W WO CM (CODE STROKE) Result Date: 09/04/2024 EXAM: CTA Head and Neck with Intravenous Contrast.  CT Head without Contrast. CLINICAL HISTORY: Neuro deficit, acute, stroke suspected. TECHNIQUE: Axial CTA images of the head and neck performed with and without intravenous contrast. MIP reconstructed images were created and reviewed. Axial computed tomography images of the head/brain performed without intravenous contrast. Note: Per PQRS, the description of internal  carotid artery percent stenosis, including 0 percent or normal exam, is based on [employer] Symptomatic Carotid Endarterectomy Trial (NASCET) criteria. Dose reduction technique was used including one or more of the following: automated exposure control, adjustment of mA and kV according to patient size, and/or iterative reconstruction. CONTRAST: Without and with; 75 mL (iohexol  (OMNIPAQUE ) 350 MG/ML injection 75 mL IOHEXOL  350 MG/ML SOLN). COMPARISON: MR head without contrast and MRA head without contrast 08/29/2024. FINDINGS: CT HEAD: BRAIN: No acute intraparenchymal hemorrhage. No mass lesion. No CT evidence for acute territorial infarct. No midline shift or extra-axial collection. VENTRICLES: No hydrocephalus. ORBITS: The orbits are unremarkable. SINUSES AND MASTOIDS: The paranasal sinuses and mastoid air cells are clear. CTA NECK: COMMON CAROTID ARTERIES: No significant stenosis. No dissection or occlusion. INTERNAL CAROTID ARTERIES: Minimal atherosclerotic calcification was present at the right carotid bifurcation without significant stenosis. Minimal atherosclerotic changes are present in the proximal left ICA without significant stenosis. No dissection or occlusion. VERTEBRAL ARTERIES: The left vertebral artery is slightly dominant to the right. No significant stenosis. No dissection or occlusion. CTA HEAD: ANTERIOR CEREBRAL ARTERIES: Mild narrowing is present in the distal left A2 segment. No occlusion. No aneurysm. MIDDLE CEREBRAL ARTERIES: High-grade near occlusive stenosis is present in the distal left M1 segment. Asymmetric attenuation of left MCA branch vessels is noted. No aneurysm. POSTERIOR CEREBRAL ARTERIES: A high-grade, near occlusive stenosis is present in the proximal left P2 segment. Moderate tandem stenoses are present in the proximal right P2 segment and proximal right P3 segment. Distal PCA branch vessels are visualized on the right. No aneurysm. BASILAR ARTERY: Moderate stenosis is  present in the mid basilar artery. No occlusion. No aneurysm. OTHER: SOFT TISSUES: No acute finding. No masses or lymphadenopathy. BONES: No acute osseous abnormality. IMPRESSION: 1. High-grade, near occlusive stenosis in the distal left M1 segment and proximal left P2 segment. 2. Moderate tandem stenoses in the proximal right P2 segment and proximal right P3 segment. 3. 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 4. Mild narrowing in the distal left A2 segment. 5. Minimal atherosclerotic calcification at the right carotid bifurcation and minimal atherosclerotic changes in the proximal left ICA, both without significant stenosis. Electronically signed by: Lonni Necessary MD 09/04/2024 12:37 PM EST RP Workstation: [id number]   CT HEAD CODE STROKE WO CONTRAST` Result Date: 09/04/2024 EXAM: CT HEAD WITHOUT 09/04/2024 12:20:07 PM TECHNIQUE: CT of the head was performed without the administration of intravenous contrast. Automated exposure control, iterative reconstruction, and/or weight based adjustment of the mA/kV was utilized to reduce the radiation dose to as low as reasonably achievable. COMPARISON: CT head without contrast 08/28/2024 and MR head without contrast 08/29/2024. CLINICAL HISTORY: Seizure, new-onset, no history of trauma; Stroke, follow up. Legs became rigid while sitting on the toilet. Symptoms have since resolved. FINDINGS: BRAIN AND VENTRICLES: Focal hemorrhage in the left caudate and left lateral ventricle is less prominent than on the prior studies. No new hemorrhage is present. Periventricular and subcortical white matter changes bilaterally are moderately advanced for age, stable from the prior study. No mass effect or midline shift. No extra-axial fluid collection. No evidence of acute infarct. No hydrocephalus. ORBITS: No acute abnormality. SINUSES AND MASTOIDS: No acute  abnormality. SOFT TISSUES AND SKULL: No acute skull fracture. No acute soft tissue abnormality. IMPRESSION: 1. No  acute intracranial abnormality. 2. Resolving left caudate and left lateral ventricular hemorrhage without new hemorrhage. 3. Stable periventricular and subcortical white matter changes bilaterally, moderately advanced for age. Electronically signed by: Lonni Necessary MD 09/04/2024 12:30 PM EST RP Workstation: [id number]   DG Abd 1 View Result Date: 09/03/2024 EXAM: 1 VIEW XRAY OF THE ABDOMEN 09/03/2024 03:17:00 PM COMPARISON: 12/20/2022 CLINICAL HISTORY: Constipation. FINDINGS: BOWEL: Nonobstructive bowel gas pattern. Moderate amount of stool was noted throughout the colon. SOFT TISSUES: No opaque urinary calculi. BONES: No acute osseous abnormality. IMPRESSION: 1. Moderate colonic stool burden consistent with constipation. Electronically signed by: Lynwood Seip MD 09/03/2024 03:39 PM EST RP Workstation: [id number]   DG Chest 2 View Result Date: 09/03/2024 EXAM: 2 VIEW(S) XRAY OF THE CHEST 09/03/2024 03:17:00 PM COMPARISON: Comparison 09/01/2024. CLINICAL HISTORY: 358444 Constipation [id number] Constipation FINDINGS: LUNGS AND PLEURA: No focal pulmonary opacity. No pleural effusion. No pneumothorax. HEART AND MEDIASTINUM: No acute abnormality of the cardiac and mediastinal silhouettes. BONES AND SOFT TISSUES: No acute osseous abnormality. IMPRESSION: 1. No acute cardiopulmonary process identified. Electronically signed by: Lynwood Seip MD 09/03/2024 03:37 PM EST RP Workstation: [id number]   DG CHEST PORT 1 VIEW Result Date: 09/01/2024 EXAM: 1 VIEW XRAY OF THE CHEST 09/01/2024 10:03:48 AM COMPARISON: None available. CLINICAL HISTORY: Fever FINDINGS: LUNGS AND PLEURA: No focal pulmonary opacity. No pleural effusion. No pneumothorax. HEART AND MEDIASTINUM: No acute abnormality of the cardiac and mediastinal silhouettes. BONES AND SOFT TISSUES: No acute osseous abnormality. IMPRESSION: 1. No acute cardiopulmonary process identified. Electronically signed by: Waddell Calk MD 09/01/2024 01:37 PM EST RP  Workstation: [id number]   VAS US  CAROTID Result Date: 08/30/2024 Carotid Arterial Duplex Study Patient Name:  Rhonda Cortez  Date of Exam:   08/30/2024 Medical Rec #: [id number]    Accession #:    [id number] Date of Birth: [birth year]    Patient Gender: F Patient Age:   9 years Exam Location:  [facility] Procedure:      VAS US  CAROTID Referring Phys: ARY XU --------------------------------------------------------------------------------  Indications:       CVA. Risk Factors:      Hypertension, hyperlipidemia. Comparison Study:  No prior studies. Performing Technologist: Cordella Collet RVT  Examination Guidelines: A complete evaluation includes B-mode imaging, spectral Doppler, color Doppler, and power Doppler as needed of all accessible portions of each vessel. Bilateral testing is considered an integral part of a complete examination. Limited examinations for reoccurring indications may be performed as noted.  Right Carotid Findings: +----------+--------+--------+--------+-----------------------+--------+           PSV cm/sEDV cm/sStenosisPlaque Description     Comments +----------+--------+--------+--------+-----------------------+--------+ CCA Prox  73      12              smooth and heterogenous         +----------+--------+--------+--------+-----------------------+--------+ CCA Distal66      20              smooth and heterogenous         +----------+--------+--------+--------+-----------------------+--------+ ICA Prox  43      13                                              +----------+--------+--------+--------+-----------------------+--------+ ICA Mid   40  16                                              +----------+--------+--------+--------+-----------------------+--------+ ICA Distal55      21                                     tortuous +----------+--------+--------+--------+-----------------------+--------+ ECA       79      12                                               +----------+--------+--------+--------+-----------------------+--------+ +----------+--------+-------+--------+-------------------+           PSV cm/sEDV cmsDescribeArm Pressure (mmHG) +----------+--------+-------+--------+-------------------+ [id number]                                         +----------+--------+-------+--------+-------------------+ +---------+--------+--+--------+-+---------+ VertebralPSV cm/s24EDV cm/s6Antegrade +---------+--------+--+--------+-+---------+  Left Carotid Findings: +----------+--------+--------+--------+-----------------------+--------+           PSV cm/sEDV cm/sStenosisPlaque Description     Comments +----------+--------+--------+--------+-----------------------+--------+ CCA Prox  86      21              smooth and heterogenous         +----------+--------+--------+--------+-----------------------+--------+ CCA Distal58      17              smooth and heterogenous         +----------+--------+--------+--------+-----------------------+--------+ ICA Prox  28      13                                              +----------+--------+--------+--------+-----------------------+--------+ ICA Mid   55      26                                              +----------+--------+--------+--------+-----------------------+--------+ ICA Distal38      19                                     tortuous +----------+--------+--------+--------+-----------------------+--------+ ECA       67      12                                              +----------+--------+--------+--------+-----------------------+--------+ +----------+--------+--------+--------+-------------------+           PSV cm/sEDV cm/sDescribeArm Pressure (mmHG) +----------+--------+--------+--------+-------------------+ [id number]                                          +----------+--------+--------+--------+-------------------+ +---------+--------+--+--------+-+---------+ VertebralPSV cm/s24EDV cm/s7Antegrade +---------+--------+--+--------+-+---------+   Summary: Right Carotid:  Velocities in the right ICA are consistent with a 1-39% stenosis. Left Carotid: Velocities in the left ICA are consistent with a 1-39% stenosis. Vertebrals: Bilateral vertebral arteries demonstrate antegrade flow. *See table(s) above for measurements and observations.  Electronically signed by Eather Popp MD on 08/30/2024 at 1:27:18 PM.    Final    MR ANGIO HEAD WO CONTRAST Result Date: 08/29/2024 EXAM: MR Angiography Head without intravenous Contrast. 08/29/2024 11:54:00 AM TECHNIQUE: Magnetic resonance angiography images of the head without intravenous contrast. Multiplanar 2D and 3D reformatted images are provided for review. COMPARISON: None provided. CLINICAL HISTORY: Stroke, hemorrhagic. FINDINGS: ANTERIOR CIRCULATION: There is mild stenosis of the ophthalmic segments of the internal carotid arteries bilaterally. No significant stenosis of the anterior cerebral arteries. 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 There is also severe stenosis of the proximal M2 branches of the left middle cerebral artery. No aneurysm. POSTERIOR CIRCULATION: No significant stenosis of the posterior cerebral arteries. There is mild-to-moderate focal stenosis of the mid basilar artery, approximately 30 to 40%. No significant stenosis of the vertebral arteries. No aneurysm. IMPRESSION: 1. 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 2. Mild stenosis of the ophthalmic segments of the internal carotid arteries bilaterally. 3. Mild-to-moderate focal stenosis of the mid basilar artery, approximately 30 to 40%. Electronically signed by: Evalene Coho MD 08/29/2024 12:11 PM EST RP Workstation:  [id number]   MR BRAIN WO CONTRAST Result Date: 08/29/2024 EXAM: MRI BRAIN WITHOUT CONTRAST 08/29/2024 11:54:00 AM TECHNIQUE: Multiplanar multisequence MRI of the head/brain was performed without the administration of intravenous contrast. COMPARISON: CT of the head dated 08/28/2024. CLINICAL HISTORY: Stroke, hemorrhagic. FINDINGS: BRAIN AND VENTRICLES: No acute infarct. Focal area of intraparenchymal hemorrhage again demonstrated within the left caudate measuring approximately 1.7 x 1.1 x 1.9 cm. Intraventricular hemorrhage again demonstrated primarily within the left lateral ventricle, but also layering independently within the posterior horn of the right lateral ventricle and within the fourth ventricle. No evidence of underlying mass. No midline shift. No hydrocephalus. Extensive cerebral white matter disease. Small focus of hemosiderin staining present within each frontal lobe and within the left anteromedial cerebellar hemisphere. The sella is unremarkable. Normal flow voids. ORBITS: No acute abnormality. SINUSES AND MASTOIDS: No acute abnormality. BONES AND SOFT TISSUES: Normal marrow signal. No acute soft tissue abnormality. IMPRESSION: 1. Focal area of intraparenchymal hemorrhage within the left caudate measuring approximately 1.7 x 1.1 x 1.9 cm, as seen on prior CT. 2. Intraventricular hemorrhage, primarily within the left lateral ventricle, but also layering independently within the posterior horn of the right lateral ventricle and within the fourth ventricle, as seen on prior CT. 3. No evidence of underlying mass. 4. Extensive cerebral white matter disease. 5. Small focus of hemosiderin staining present within each frontal lobe and within the left anteromedial cerebellar hemisphere. Electronically signed by: Evalene Coho MD 08/29/2024 12:08 PM EST RP Workstation: [id number]   ECHOCARDIOGRAM COMPLETE Result Date: 08/28/2024    ECHOCARDIOGRAM REPORT   Patient Name:   Rhonda Cortez Date of Exam:  08/28/2024 Medical Rec #:  [id number]   Height:       63.0 in Accession #:    [id number]  Weight:       140.2 lb Date of Birth:  Dec 29, 1960   BSA:          1.663 m Patient Age:    49 years  BP:           132/84 mmHg Patient Gender: F           HR:           83 bpm. Exam Location:  Inpatient Procedure: 2D Echo (Both Spectral and Color Flow Doppler were utilized during            procedure). Indications:    stroke  History:        Patient has no prior history of Echocardiogram examinations.                 Risk Factors:Hypertension and Dyslipidemia.  Sonographer:    Tinnie Barefoot RDCS Referring Phys: [phone] MCNEILL P KIRKPATRICK IMPRESSIONS  1. Left ventricular ejection fraction, by estimation, is 60 to 65%. The left ventricle has normal function. The left ventricle has no regional wall motion abnormalities. Left ventricular diastolic parameters were normal.  2. Right ventricular systolic function is normal. The right ventricular size is normal. There is normal pulmonary artery systolic pressure. The estimated right ventricular systolic pressure is 31.1 mmHg.  3. The mitral valve is normal in structure. Trivial mitral valve regurgitation. No evidence of mitral stenosis.  4. The aortic valve is tricuspid. Aortic valve regurgitation is not visualized. No aortic stenosis is present.  5. The inferior vena cava is normal in size with greater than 50% respiratory variability, suggesting right atrial pressure of 3 mmHg. FINDINGS  Left Ventricle: Left ventricular ejection fraction, by estimation, is 60 to 65%. The left ventricle has normal function. The left ventricle has no regional wall motion abnormalities. The left ventricular internal cavity size was normal in size. There is  no left ventricular hypertrophy. Left ventricular diastolic parameters were normal. Right Ventricle: The right ventricular size is normal. No increase in right ventricular wall thickness. Right ventricular systolic function is normal. There is  normal pulmonary artery systolic pressure. The tricuspid regurgitant velocity is 2.65 m/s, and  with an assumed right atrial pressure of 3 mmHg, the estimated right ventricular systolic pressure is 31.1 mmHg. Left Atrium: Left atrial size was normal in size. Right Atrium: Right atrial size was normal in size. Pericardium: There is no evidence of pericardial effusion. Mitral Valve: The mitral valve is normal in structure. Trivial mitral valve regurgitation. No evidence of mitral valve stenosis. Tricuspid Valve: The tricuspid valve is normal in structure. Tricuspid valve regurgitation is trivial. Aortic Valve: The aortic valve is tricuspid. Aortic valve regurgitation is not visualized. No aortic stenosis is present. Pulmonic Valve: The pulmonic valve was not well visualized. Pulmonic valve regurgitation is trivial. Aorta: The aortic root is normal in size and structure. Venous: The inferior vena cava is normal in size with greater than 50% respiratory variability, suggesting right atrial pressure of 3 mmHg. IAS/Shunts: The interatrial septum was not well visualized.  LEFT VENTRICLE PLAX 2D LVIDd:         3.80 cm   Diastology LVIDs:         2.60 cm   LV e' medial:    10.20 cm/s LV PW:         0.90 cm   LV E/e' medial:  10.5 LV IVS:        0.80 cm   LV e' lateral:   9.14 cm/s LVOT diam:     1.80 cm   LV E/e' lateral: 11.7 LV SV:         53 LV SV Index:   32 LVOT Area:  2.54 cm LV IVRT:       67 msec  RIGHT VENTRICLE             IVC RV Basal diam:  2.40 cm     IVC diam: 1.80 cm RV S prime:     12.60 cm/s TAPSE (M-mode): 1.6 cm LEFT ATRIUM             Index        RIGHT ATRIUM          Index LA diam:        2.70 cm 1.62 cm/m   RA Area:     9.32 cm LA Vol (A2C):   27.6 ml 16.60 ml/m  RA Volume:   19.60 ml 11.79 ml/m LA Vol (A4C):   28.9 ml 17.38 ml/m LA Biplane Vol: 29.0 ml 17.44 ml/m  AORTIC VALVE LVOT Vmax:   109.00 cm/s LVOT Vmean:  69.700 cm/s LVOT VTI:    0.210 m  AORTA Ao Root diam: 2.40 cm MITRAL VALVE                 TRICUSPID VALVE MV Area (PHT): 5.02 cm     TR Peak grad:   28.1 mmHg MV Decel Time: 151 msec     TR Vmax:        265.00 cm/s MV E velocity: 107.00 cm/s MV A velocity: 109.00 cm/s  SHUNTS MV E/A ratio:  0.98         Systemic VTI:  0.21 m                             Systemic Diam: 1.80 cm Lonni Nanas MD Electronically signed by Lonni Nanas MD Signature Date/Time: 08/28/2024/2:47:28 PM    Final    CT HEAD WO CONTRAST ( ) Result Date: 08/28/2024 EXAM: CT HEAD WITHOUT CONTRAST 08/28/2024 05:43:00 AM TECHNIQUE: CT of the head was performed without the administration of intravenous contrast. Automated exposure control, iterative reconstruction, and/or weight based adjustment of the mA/kV was utilized to reduce the radiation dose to as low as reasonably achievable. COMPARISON: 08/27/2024 CLINICAL HISTORY: Stroke, hemorrhagic FINDINGS: BRAIN AND VENTRICLES: The left caudate intraparenchymal hemorrhage noted on the previous study appears unchanged in the interim. There is also no significant interval change in the volume or appearance of intraventricular hemorrhage, which is primarily situated within the left lateral ventricle but is also again demonstrated within the third and fourth ventricles and dependently within the posterior horn of the right lateral ventricle. There is moderate periventricular and deep cerebral white matter disease. The encephalomalacia changes described within the right anterior frontal lobe on the prior study is not appreciated on the current exam and was likely superior artifact caused by prominent calcification within the falx. There is no adverse interval change. No evidence of acute infarct. No hydrocephalus. No extra-axial collection. No mass effect or midline shift. ORBITS: No acute abnormality. SINUSES: No acute abnormality. SOFT TISSUES AND SKULL: No acute soft tissue abnormality. No skull fracture. IMPRESSION: 1. Stable left caudate intraparenchymal  hemorrhage and intraventricular hemorrhage. 2. Moderate periventricular and deep cerebral white matter disease. 3. No adverse interval change. Electronically signed by: Evalene Coho MD 08/28/2024 06:17 AM EST RP Workstation: [id number]   CT HEAD WO CONTRAST Result Date: 08/27/2024 EXAM: CT HEAD WITHOUT study_datetime TECHNIQUE: CT of the head was performed without the administration of intravenous contrast. Automated exposure control, iterative reconstruction, and/or weight based adjustment of the mA/kV was  utilized to reduce the radiation dose to as low as reasonably achievable. COMPARISON: None available. CLINICAL HISTORY: 63 year old female was missing overnight, memory loss. FINDINGS: BRAIN AND VENTRICLES: Moderate to large volume of hyperdense blood in the left lateral ventricle, the 3rd and 4th ventricles. There is evidence of associated biconvex intra-axial hemorrhage of the left caudate nucleus on series 2 image 15. Estimated caudate intra-axial blood products measure 18 x 10 x 17 mm (AP x transverse x CC), with an estimated volume of 2 mL. Probable tubular hypodense thrombus is present within the left lateral ventricle blood products. There is a trace rightward midline shift (2 to 3 mm) in part related to mild asymmetric enlargement of the left lateral ventricle. Temporal horns are diminutive. No transependymal edema is evident. Basilar cisterns remain patent. There is no convincing subarachnoid hemorrhage. Patchy and confluent bilateral cerebral white matter hypodensity in both hemispheres is advanced for age. There is evidence of small volume cortical encephalomalacia in the anterior right frontal lobe medially on series 2 image 17. Heterogeneous hypodensity is also noted in the bilateral thalami. Posterior fossa gray white differentiation is within normal limits. Calcified atherosclerosis is present at the skull base. Asymmetric right basal ganglia vascular calcifications are noted. No suspicious  intracranial vascular hyperdensity. ORBITS: No acute abnormality. SINUSES AND MASTOIDS: Visible paranasal sinuses, middle ears and mastoids are well aerated. SOFT TISSUES AND SKULL: No acute skull fracture. No acute orbital scalp soft tissue finding. IMPRESSION: 1. Acute intracranial hemorrhage appears to be significant intraventricular extension from an acute bleed in the left caudate nucleus (estimated 2 mL). 2. Mild asymmetric enlargement of the left lateral due to blood ;temporal horns remain diminutive. Trace rightward midline shift (23 mm). 3. Evidence of underlying age advanced chronic small vessel disease. 4. Study discussed by telephone with Dr. Pamella at the time of this report. Electronically signed by: Helayne Hurst MD 08/27/2024 01:44 PM EST RP Workstation: [id number]    Microbiology: Results for orders placed or performed during the hospital encounter of 08/27/24  MRSA Next Gen by PCR, Nasal     Status: None   Collection Time: 08/27/24  8:48 PM   Specimen: Nasal Mucosa; Nasal Swab  Result Value Ref Range Status   MRSA by PCR Next Gen NOT DETECTED NOT DETECTED Final    Comment: (NOTE) The GeneXpert MRSA Assay (FDA approved for NASAL specimens only), is one component of a comprehensive MRSA colonization surveillance program. It is not intended to diagnose MRSA infection nor to guide or monitor treatment for MRSA infections. Test performance is not FDA approved in patients less than 35 years old. Performed at [facility], [address], [place], [place] [address]   Culture, blood (Routine X 2) w Reflex to ID Panel     Status: None   Collection Time: 09/01/24 10:09 AM   Specimen: BLOOD LEFT ARM  Result Value Ref Range Status   Specimen Description BLOOD LEFT ARM  Final   Special Requests   Final    BOTTLES DRAWN AEROBIC AND ANAEROBIC Blood Culture adequate volume   Culture   Final    NO GROWTH 5 DAYS Performed at [facility], [address], [place], [place]  [address]    Report Status 09/06/2024 FINAL  Final  Culture, blood (Routine X 2) w Reflex to ID Panel     Status: None   Collection Time: 09/01/24 10:18 AM   Specimen: BLOOD RIGHT HAND  Result Value Ref Range Status  Specimen Description BLOOD RIGHT HAND  Final   Special Requests   Final    BOTTLES DRAWN AEROBIC AND ANAEROBIC Blood Culture results may not be optimal due to an inadequate volume of blood received in culture bottles   Culture   Final    NO GROWTH 5 DAYS Performed at [facility], [address] [place], [place] [address]    Report Status 09/06/2024 FINAL  Final    Labs: CBC: Recent Labs  Lab 09/02/24 0406 09/03/24 0544 09/05/24 0431  WBC 12.2* 13.8* 13.0*  NEUTROABS  --  8.7*  --   HGB 14.6 14.1 14.2  HCT 42.5 40.4 41.5  MCV 85.5 86.0 87.2  PLT 294 319 301   Basic Metabolic Panel: Recent Labs  Lab 09/02/24 0406 09/03/24 0544 09/05/24 0431  NA 133* 134* 136  K 3.9 4.0 3.8  CL 98 99 101  CO2 23 22 21*  GLUCOSE 135* 128* 109*  BUN 27* 29* 30*  CREATININE 1.84* 1.76* 1.81*  CALCIUM  9.0 9.2 9.2  MG 2.0  --   --   PHOS 3.6  --   --    Liver Function Tests: Recent Labs  Lab 09/03/24 0544  AST 15  ALT 13  ALKPHOS 58  BILITOT 0.9  PROT 7.9  ALBUMIN 3.7   CBG: Recent Labs  Lab 09/04/24 1157  GLUCAP 179*    Discharge time spent: less than 30 minutes.  Signed: Garnette Pelt, MD [facility] 09/08/2024

## 2024-09-08 NOTE — Progress Notes (Signed)
Pt received on unit.  

## 2024-09-08 NOTE — Progress Notes (Signed)
 Occupational Therapy Treatment Patient Details Name: Rhonda Cortez MRN: [id number] DOB: 1961/06/07 Today's Date: 09/08/2024   History of present illness Pt is a 63 y.o. F who was originally admitted for small L caudate IPH and subsequently discharged to CIR. Code stroke called on CIR 11/22 due to acute onset poor responsiveness, favored to be a seizure. MRI on 11/24 revealed acute infarcts involving L PCA and adjacent MCA/PCA watershed territories with mild cytotoxic edema. No seizures on EEG. PMH: HTN, CVA.   OT comments  Patient received in supine, alert and asking to use bathroom. Patient demonstrating good gains with LB dressing with supervision seated on EOB to donn socks.  Patient was min to CGA to power up and ambulate to bathroom for toilet transfer.  Patient able to stand at sink for grooming tasks but required seated rest break following. Patient will benefit from intensive inpatient follow-up therapy, >3 hours/day.  Acute OT to continue to follow to address established goals to facilitate DC to next venue of care.        If plan is discharge home, recommend the following:  A little help with walking and/or transfers;A little help with bathing/dressing/bathroom;Assistance with cooking/housework;Assist for transportation;Help with stairs or ramp for entrance   Equipment Recommendations  Other (comment)    Recommendations for Other Services      Precautions / Restrictions Precautions Precautions: Fall Recall of Precautions/Restrictions: Impaired Precaution/Restrictions Comments: SBP < 160 Restrictions Weight Bearing Restrictions Per Provider Order: No       Mobility Bed Mobility Overal bed mobility: Needs Assistance Bed Mobility: Supine to Sit     Supine to sit: Supervision     General bed mobility comments: cues to initiate    Transfers Overall transfer level: Needs assistance Equipment used: Rolling walker (2 wheels) Transfers: Sit to/from Stand Sit to Stand:  Contact guard assist           General transfer comment: CGA to min assist with power up and transfers     Balance Overall balance assessment: Needs assistance Sitting-balance support: Feet supported, No upper extremity supported Sitting balance-Leahy Scale: Fair Sitting balance - Comments: sitting on EOB   Standing balance support: Single extremity supported, Bilateral upper extremity supported, During functional activity Standing balance-Leahy Scale: Poor Standing balance comment: reliant on RW for safe balance                           ADL either performed or assessed with clinical judgement   ADL Overall ADL's : Needs assistance/impaired     Grooming: Wash/dry hands;Wash/dry face;Oral care;Contact guard assist;Cueing for sequencing;Standing Grooming Details (indicate cue type and reason): at sink Upper Body Bathing: Set up;Sitting           Lower Body Dressing: Supervision/safety;Cueing for sequencing;Sitting/lateral leans Lower Body Dressing Details (indicate cue type and reason): donned socks seated on EOB Toilet Transfer: Minimal assistance;Rolling walker (2 wheels) Toilet Transfer Details (indicate cue type and reason): cues for safety Toileting- Clothing Manipulation and Hygiene: Supervision/safety;Sitting/lateral lean Toileting - Clothing Manipulation Details (indicate cue type and reason): able to perform toilet hygiene seated       General ADL Comments: cues for sequencing for self care, often appears she was waiting for commands    Extremity/Trunk Assessment              Vision       Perception     Praxis     Communication Communication Communication: Impaired Factors  Affecting Communication: Reduced clarity of speech;Difficulty expressing self   Cognition Arousal: Alert Behavior During Therapy: Flat affect Cognition: Cognition impaired   Orientation impairments: Place, Time, Situation (did not know she was in a hospital but  aware she was in [place]) Awareness: Online awareness impaired   Attention impairment (select first level of impairment): Sustained attention Executive functioning impairment (select all impairments): Organization, Sequencing, Problem solving, Initiation                   Following commands: Impaired Following commands impaired: Follows one step commands with increased time, Follows multi-step commands with increased time      Cueing   Cueing Techniques: Verbal cues  Exercises      Shoulder Instructions       General Comments      Pertinent Vitals/ Pain       Pain Assessment Pain Assessment: Faces Faces Pain Scale: Hurts a little bit Pain Location: Head Pain Descriptors / Indicators: Headache Pain Intervention(s): Monitored during session  Home Living                                          Prior Functioning/Environment              Frequency  Min 2X/week        Progress Toward Goals  OT Goals(current goals can now be found in the care plan section)  Progress towards OT goals: Progressing toward goals  Acute Rehab OT Goals Patient Stated Goal: none stated OT Goal Formulation: With patient Time For Goal Achievement: 09/20/24 Potential to Achieve Goals: Good ADL Goals Pt Will Perform Grooming: with supervision;standing Pt Will Perform Lower Body Dressing: with supervision;sit to/from stand Pt Will Transfer to Toilet: with supervision;ambulating;regular height toilet Pt/caregiver will Perform Home Exercise Program: Increased strength;Both right and left upper extremity Additional ADL Goal #1: pt will follow 3 step commands during ADL with min cues.  Plan      Co-evaluation                 AM-PAC OT 6 Clicks Daily Activity     Outcome Measure   Help from another person eating meals?: A Little Help from another person taking care of personal grooming?: A Little Help from another person toileting, which includes  using toliet, bedpan, or urinal?: A Little Help from another person bathing (including washing, rinsing, drying)?: A Little Help from another person to put on and taking off regular upper body clothing?: A Little Help from another person to put on and taking off regular lower body clothing?: A Little 6 Click Score: 18    End of Session Equipment Utilized During Treatment: Gait belt;Rolling walker (2 wheels)  OT Visit Diagnosis: Unsteadiness on feet (R26.81);Muscle weakness (generalized) (M62.81);Other symptoms and signs involving cognitive function   Activity Tolerance Patient tolerated treatment well   Patient Left in chair;with call bell/phone within reach;with chair alarm set   Nurse Communication Mobility status        Time: 9284-9261 OT Time Calculation (min): 23 min  Charges: OT General Charges $OT Visit: 1 Visit OT Treatments $Self Care/Home Management : 23-37 mins  Dick Laine, OTA Acute Rehabilitation Services  Office [phone]   Jeb LITTIE Laine 09/08/2024, 9:17 AM

## 2024-09-08 NOTE — H&P (Signed)
 Physical Medicine and Rehabilitation Admission H&P    cc: Functional deficits due to CVA   HPI: Rhonda Cortez is a 63 year old female with PMHX of hypertension, HLD, and history of CVA in 2014 presented to [facility] on 08/27/2024.  Per chart review the patient was supposed to arrive at a friend's home around 7 PM that night but did not arrive.  When the patient answered her phone around 3 AM she was confused and had difficulty finding words.  In the ED a head CT showed acute intracranial hemorrhage from the left caudate region with extension of the intraventricular region on the left.  Neurology was consulted and the patient was admitted to the ICU.  A repeat CT head showed caudate head hemorrhage with intraventricular extension.  Patient underwent an MRI that confirmed intraventricular hemorrhage, no evidence of underlying mass. 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  Neurology recommended no antithrombotics due to ICH.  Patient was on aspirin  after stroke (2014) for a number of years and then off. VTE prophylaxis SCDs. 2D echo with EF 60 to 65%.  Hospital course complicated by AKI, ongoing headaches, and new fever of unknown origin.   The patient was admitted to CIR on 11/20 and was doing well. She experienced sudden onset of right sided weakness with rigidity, decreased responsiveness, and not following commands. Code stroke was activated and the patient was transferred back to acute care on 11/22 for stroke workup. CT head showed resolving left caudate and left lateral ventricular hemorrhage without new hemorrhage.  CTA head and neck showing high-grade near occlusive stenosis in the proximal right P2 segment and proximal right P3 segment, moderate stenosis in the mid basilar artery, mild narrowing at the distal left A2 segment, and minimal atherosclerotic calcification at the right carotid bifurcation and minimal  atherosclerotic changes in the proximal left ICA, both without significant stenosis.    Per neurology, patient was not a candidate for TNK or EVT due to no LVO noted on imaging and felt symptoms were concerning for seizure in the setting of recent ICH.  She was started on LTM EEG monitoring was given Keppra  1500 mg load,  maintenance Keppra  500 mg twice daily and transition to Vimpat  on 11/24.  EEG suggestive of cortical dysfunction arising from left temporal region likely secondary to underlying structural abnormality. No seizures or epileptiform discharges were seen. Repeat MRI brain was performed and showed acute infarcts involving the left PCA and adjacent MCA/PCA watershed territories with mild cytotoxic edema.  Neurology recommended aspirin  but no DAPT given ICH history.   Prior to arrival the patient was fully independent working and driving with no use of DME.  She lives alone in a 1 level home with 2 stairs to enter.  Patient currently requires contact-guard assist to min assist for mobility.Therapy evaluations completed due to patient decreased functional mobility was admitted for a comprehensive rehab program. She currently has fever.   ROS +fever   Past Medical History:  Diagnosis Date   Hypertension    Stroke [facility])    Past Surgical History:  Procedure Laterality Date   OOPHORECTOMY     TUBAL LIGATION     Family History  Problem Relation Age of Onset   Diabetes Mother    Kidney disease Father    Hypertension Father    Hypertension Sister    Hypertension Brother    Asthma Son  Social History:  reports that she has never smoked. She has never used smokeless tobacco. She reports that she does not drink alcohol and does not use drugs. Allergies: No Known Allergies Medications Prior to Admission  Medication Sig Dispense Refill   acetaminophen  (TYLENOL ) 325 MG tablet Take 2 tablets (650 mg total) by mouth every 4 (four) hours as needed for mild pain (pain score 1-3), fever or  headache (or temp > 37.5 C (99.5 F)).     amLODipine  (NORVASC ) 10 MG tablet Take 10 mg by mouth daily.     [START ON 09/09/2024] aspirin  EC 81 MG tablet Take 1 tablet (81 mg total) by mouth daily. Swallow whole.     atorvastatin  (LIPITOR) 40 MG tablet Take 1 tablet (40 mg total) by mouth daily.     Cholecalciferol (VITAMIN D-3 PO) Take 1 capsule by mouth daily.     lacosamide  (VIMPAT ) 50 MG TABS tablet Take 1 tablet (50 mg total) by mouth every 12 (twelve) hours.     melatonin 5 MG TABS Take 1 tablet (5 mg total) by mouth at bedtime as needed.     pantoprazole  (PROTONIX ) 40 MG tablet Take 1 tablet (40 mg total) by mouth at bedtime.     polyethylene glycol (MIRALAX ) 17 g packet Take 17 g by mouth daily as needed for moderate constipation or severe constipation.     senna-docusate (SENOKOT-S) 8.6-50 MG tablet Take 1 tablet by mouth 2 (two) times daily.     triamcinolone  ointment (KENALOG ) 0.1 % Apply 1 Application topically 2 (two) times daily as needed (skin irritation).     [Paused] valsartan  (DIOVAN ) 80 MG tablet Take 1 tablet (80 mg total) by mouth daily. (Patient taking differently: Take 80 mg by mouth at bedtime.) 30 tablet 2   Home: Home Living Family/patient expects to be discharged to:: Private residence Living Arrangements: Alone Available Help at Discharge: Family, Available 24 hours/day Type of Home: House Home Access: Stairs to enter [employer] of Steps: 4-5 front entrance with rails, 1 step back entrance (usual entrance) no rail Entrance Stairs-Rails: Can reach both Home Layout: One level Bathroom Shower/Tub: Psychologist, counselling, Engineer, [employer]: Standard Bathroom Accessibility: Yes Home Equipment: Shower seat, Grab bars - tub/shower, Hand held shower head  Lives With: Significant other   Functional History: Prior Function Prior Level of Function : Independent/Modified Independent, Driving, Working/employed Mobility Comments: No AD ADLs Comments:  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 Independent in ADL, IADL, working, driving   Functional Status:  Mobility: Bed Mobility Overal bed mobility: Needs Assistance Bed Mobility: Supine to Sit Supine to sit: Supervision Sit to supine: Supervision General bed mobility comments: cues to initiate Transfers Overall transfer level: Needs assistance Equipment used: Rolling walker (2 wheels) Transfers: Sit to/from Stand Sit to Stand: Contact guard assist General transfer comment: CGA to min assist with power up and transfers Ambulation/Gait Ambulation/Gait assistance: Contact guard assist, Min assist Gait Distance (Feet): 20 Feet Assistive device: Rolling walker (2 wheels) Gait Pattern/deviations: Narrow base of support, Staggering right, Shuffle, Decreased stride length, Step-to pattern General Gait Details: Pt with tendency to walk with LEs adducted, requiring repeat VC to widen base of support. Pt wiht one slight LOB, requiring minA to recover to upright. States she is unsure why she feels off balance. Gait velocity: reduced Gait velocity interpretation: <1.8 ft/sec, indicate of risk for recurrent falls   ADL: ADL Overall ADL's : Needs assistance/impaired Eating/Feeding: Set up, Sitting Grooming: Wash/dry hands, Wash/dry face, Oral care,  Contact guard assist, Cueing for sequencing, Standing Grooming Details (indicate cue type and reason): at sink Upper Body Bathing: Set up, Sitting Lower Body Bathing: Minimal assistance, Sitting/lateral leans, Sit to/from stand Upper Body Dressing : Set up, Sitting Lower Body Dressing: Supervision/safety, Cueing for sequencing, Sitting/lateral leans Lower Body Dressing Details (indicate cue type and reason): donned socks seated on EOB Toilet Transfer: Minimal assistance, Rolling walker (2 wheels) Toilet Transfer Details (indicate cue type and reason): cues for safety Toileting- Clothing Manipulation and Hygiene: Supervision/safety, Sitting/lateral  lean Toileting - Clothing Manipulation Details (indicate cue type and reason): able to perform toilet hygiene seated Functional mobility during ADLs: Minimal assistance, Rolling walker (2 wheels) General ADL Comments: cues for sequencing for self care, often appears she was waiting for commands   Cognition: Cognition Orientation Level: Oriented to person, Disoriented to place, Disoriented to time, Disoriented to situation Cognition Arousal: Alert Behavior During Therapy: Flat affect    Physical Exam: Blood pressure (!) 140/80, pulse (!) 102, temperature 99.3 F (37.4 C), temperature source Oral, resp. rate 18, height 5' 3 (1.6 m), weight 61 kg, SpO2 99%. Physical Exam Gen: no distress, normal appearing HEENT: oral mucosa pink and moist, NCAT Cardio: Reg rate Chest: normal effort, normal rate of breathing Abd: soft, non-distended Ext: no edema Psych: pleasant, normal affect Skin: intact Neuro: Alert but appears sleepy. Can follow commands. 4/5 strength in upper extremities, 3/5 strength in lower extremities  No results found for this or any previous visit (from the past 48 hours). No results found.  Blood pressure (!) 140/80, pulse (!) 102, temperature 99.3 F (37.4 C), temperature source Oral, resp. rate 18, height 5' 3 (1.6 m), weight 61 kg, SpO2 99%.  Medical Problem List and Plan: 1. Functional deficits secondary to left PCA and MCA/PCA watershed territory infarcts  -patient may shower  -ELOS/Goals: 10-14 days S  Admit to CIR  2.  Antithrombotics: -DVT/anticoagulation:  Mechanical: Sequential compression devices, below knee Bilateral lower extremities Pharmaceutical: Heparin   -antiplatelet therapy: continue Aspirin  81 mg --No DAPT due to recent ICH  3. Pain Management:  4. Mood/Behavior/Sleep: LCSW to follow for evaluation and support when available.   -antipsychotic agents: N/A   -sleep:   5. Neuropsych/cognition: This patient is not capable of making decisions  on her own behalf.  6. Skin/Wound Care: Routine pressure relief measures.   7. AKI on CKD: IVF started at 44mls/hr.    8.Stroke: left PCA and MCA/PCA watershed territories infarcts with cytotoxic edema, etiology: Likely large vessel disease, probably in the setting of syncope/orthostatic hypotension:   -Continue Asprin   9. Hx recent ICH: continue Lipitor 40 mg daily   10. Hx stroke/TIA: Stroke 2014-no significant residual deficit. Continue statin.   11. Seizure: Episode on 11/22, EEG negative for seizure activity. Loaded with Keppra , now on Vimpat  50 mg bid.   12. 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 Avoid low BP-Longterm BP goal 130-150 systolic given severe stenosis/occlusion of left MI and P1.   -Monitor orthostatic vitals especially with activity.   13.HTN: continue home regime- Norvac and Valsartan .  Same as above.   14. HLD: was on Pitavastatin  at home, continue Atorvastatin  40 mg daily.   15.  Fever of unk origin: Intermittent fevers continue ranging Tmax 100-100.9.  Repeat Chest xray/urinalysis/Blood cultures and venous doppler and CT abd/pelvis d/t recent ICH. UA shows moderate leukocytes: keflex  started, f/u UC/BC/above imaging  16. Tachycardia Toprol  XL started HS  17.  Leukocytosis: continue to trend   I have personally performed a face to face diagnostic evaluation, including, but not limited to relevant history and physical exam findings, of this patient and developed relevant assessment and plan.  Additionally, I have reviewed and concur with the physician assistant's documentation above.  Daphne Satterfield, NP  Sven SHAUNNA Elks, MD 09/08/2024

## 2024-09-08 NOTE — Plan of Care (Signed)
  Problem: Education: Goal: Knowledge of General Education information will improve Description: Including pain rating scale, medication(s)/side effects and non-pharmacologic comfort measures Outcome: Progressing   Problem: Health Behavior/Discharge Planning: Goal: Ability to manage health-related needs will improve Outcome: Progressing   Problem: Clinical Measurements: Goal: Ability to maintain clinical measurements within normal limits will improve Outcome: Progressing   Problem: Activity: Goal: Risk for activity intolerance will decrease Outcome: Progressing   Problem: Nutrition: Goal: Adequate nutrition will be maintained Outcome: Progressing   Problem: Coping: Goal: Level of anxiety will decrease Outcome: Progressing   Problem: Education: Goal: Knowledge of disease or condition will improve Outcome: Progressing   Problem: Ischemic Stroke/TIA Tissue Perfusion: Goal: Complications of ischemic stroke/TIA will be minimized Outcome: Progressing

## 2024-09-08 NOTE — Progress Notes (Signed)
 Received a call from [place], pt's fiance. He states that pt just called him saying that she was taking everything off and that she was hungry. Upon entering the room immediately after discussing this, pt was curled up in bed with the covers over her, and she stated that she just wanted to sleep. This was relayed to pt's fiance Marcey over the phone.   Pt asked for something for a headache, Tylenol  was given at 0259. Pt did not remember. When asked how she would rate her pain, she states that her head was a 0/10, and that she just needed to sleep.   [facility] phone # [phone]

## 2024-09-09 ENCOUNTER — Inpatient Hospital Stay (HOSPITAL_COMMUNITY)

## 2024-09-09 DIAGNOSIS — R509 Fever, unspecified: Secondary | ICD-10-CM | POA: Diagnosis not present

## 2024-09-09 DIAGNOSIS — I63532 Cerebral infarction due to unspecified occlusion or stenosis of left posterior cerebral artery: Secondary | ICD-10-CM

## 2024-09-09 LAB — CBC WITH DIFFERENTIAL/PLATELET
Abs Immature Granulocytes: 0.07 K/uL (ref 0.00–0.07)
Basophils Absolute: 0 K/uL (ref 0.0–0.1)
Basophils Relative: 0 %
Eosinophils Absolute: 0 K/uL (ref 0.0–0.5)
Eosinophils Relative: 0 %
HCT: 39.2 % (ref 36.0–46.0)
Hemoglobin: 13.3 g/dL (ref 12.0–15.0)
Immature Granulocytes: 1 %
Lymphocytes Relative: 18 %
Lymphs Abs: 2.5 K/uL (ref 0.7–4.0)
MCH: 29.4 pg (ref 26.0–34.0)
MCHC: 33.9 g/dL (ref 30.0–36.0)
MCV: 86.7 fL (ref 80.0–100.0)
Monocytes Absolute: 0.9 K/uL (ref 0.1–1.0)
Monocytes Relative: 6 %
Neutro Abs: 10.2 K/uL — ABNORMAL HIGH (ref 1.7–7.7)
Neutrophils Relative %: 75 %
Platelets: 402 K/uL — ABNORMAL HIGH (ref 150–400)
RBC: 4.52 MIL/uL (ref 3.87–5.11)
RDW: 11.8 % (ref 11.5–15.5)
WBC: 13.7 K/uL — ABNORMAL HIGH (ref 4.0–10.5)
nRBC: 0 % (ref 0.0–0.2)

## 2024-09-09 LAB — BASIC METABOLIC PANEL WITH GFR
Anion gap: 15 (ref 5–15)
BUN: 29 mg/dL — ABNORMAL HIGH (ref 8–23)
CO2: 19 mmol/L — ABNORMAL LOW (ref 22–32)
Calcium: 8.8 mg/dL — ABNORMAL LOW (ref 8.9–10.3)
Chloride: 102 mmol/L (ref 98–111)
Creatinine, Ser: 2.1 mg/dL — ABNORMAL HIGH (ref 0.44–1.00)
GFR, Estimated: 26 mL/min — ABNORMAL LOW (ref >60)
Glucose, Bld: 177 mg/dL — ABNORMAL HIGH (ref 70–99)
Potassium: 4.4 mmol/L (ref 3.5–5.1)
Sodium: 136 mmol/L (ref 135–145)

## 2024-09-09 MED ORDER — SODIUM CHLORIDE 0.9 % IV SOLN: INTRAVENOUS | Status: AC

## 2024-09-09 MED ORDER — SORBITOL 70 % SOLN
30.0000 mL | Freq: Once | Status: AC
  Administered 2024-09-09: 30 mL via ORAL
  Filled 2024-09-09: qty 30

## 2024-09-09 NOTE — Progress Notes (Signed)
 PROGRESS NOTE   Subjective/Complaints:  Pt  family member reports had  yellow MEWS last night- asking about  test results.   WBC down to 13.7 form 15.4- on ABX for UTI Went over results of CT- had constipation and liver  spot that needs f/u in 6 months most likely.   Pt report LBM 4-5 days ago.   Cr 2.10 and BUN 29- up from 1.98 and 26- so just slightly up- on 40 cc/hour IVFs  Objective:   CT ABDOMEN PELVIS WO CONTRAST Result Date: 09/09/2024 EXAM: CT ABDOMEN AND PELVIS WITHOUT CONTRAST 09/09/2024 05:21:13 AM TECHNIQUE: CT of the abdomen and pelvis was performed without the administration of intravenous contrast. Multiplanar reformatted images are provided for review. Automated exposure control, iterative reconstruction, and/or weight-based adjustment of the mA/kV was utilized to reduce the radiation dose to as low as reasonably achievable. COMPARISON: None available. CLINICAL HISTORY: Sepsis FINDINGS: LOWER CHEST: Lung bases are clear of infiltrates with asymmetric posterior atelectasis in the right lower lobe. There is mild elevation in the right hemidiaphragm. The cardiac size is normal. LIVER: The liver is 18 cm in length with mild steatosis. No masses seen without contrast. There is a 5 mm too small to characterize hypodensity in segment 7 on axial image 12 of series 3. No follow-up imaging is needed in a low-risk patient. In a high-risk patient, consider 17-month follow-up liver-dedicated MRI. The remaining liver is homogeneous. GALLBLADDER AND BILE DUCTS: Gallbladder is unremarkable. No biliary ductal dilatation. SPLEEN: No acute abnormality. PANCREAS: No acute abnormality. ADRENAL GLANDS: There is no adrenal mass. KIDNEYS, URETERS AND BLADDER: There is a 4.7 cm Bosniak type 1 cyst in the superior pole of the right kidney, Hounsfield density of 17. There is scarring in the right lower pole. No follow-up imaging is recommended. The  remainder of the unenhanced kidneys are unremarkable. There is no urinary stone or obstruction. No perinephric or periureteral stranding. Urinary bladder is unremarkable. GI AND BOWEL: The stomach is contracted and unremarkable accounting for contraction. There is no small bowel obstruction or inflammation. An appendix is not seen. There is mild fecal stasis. No evidence of colitis or diverticulitis. PERITONEUM AND RETROPERITONEUM: There is no free fluid, free hemorrhage, free air, or localizing inflammatory process. VASCULATURE: Aorta is normal in caliber. LYMPH NODES: No lymphadenopathy. REPRODUCTIVE ORGANS: There are multiple pelvic phleboliths. Lobulated fibroid uterus with 2 cm calcified fibroid in the body of the uterus to the left. The ovaries are not enlarged. BONES AND SOFT TISSUES: There is subcutaneous fatty stranding in the lower anterior wall consistent with prior subcutaneous injections. There are mild degenerative changes of the lumbar spine. No acute osseous abnormality. IMPRESSION: 1. No septic source identified with noncontrast CT. 2. Subcentimeter hypodensity in the liver. No follow up imaging recommended in a low risk patient; in a high risk patient, 69-month follow-up a liver-dedicated MRI would be recommended. 3. Constipation. 4. Fibroid uterus. Electronically signed by: Francis Quam MD 09/09/2024 06:15 AM EST RP Workstation: [id number]   DG Chest 2 View Result Date: 09/08/2024 EXAM: 2 VIEW(S) XRAY OF THE CHEST 09/08/2024 06:06:00 PM COMPARISON: 09/03/2024 CLINICAL HISTORY: Fever. FINDINGS: LUNGS AND PLEURA: No focal pulmonary opacity.  No pleural effusion. No pneumothorax. HEART AND MEDIASTINUM: No acute abnormality of the cardiac and mediastinal silhouettes. BONES AND SOFT TISSUES: No acute osseous abnormality. IMPRESSION: 1. No acute cardiopulmonary process. Electronically signed by: Franky Crease MD 09/08/2024 10:17 PM EST RP Workstation: [id number]   Recent Labs    09/08/24 1747  09/09/24 0630  WBC 15.4* 13.7*  HGB 14.1 13.3  HCT 40.2 39.2  PLT 417* 402*   Recent Labs    09/08/24 1747 09/09/24 0630  NA 132* 136  K 4.2 4.4  CL 99 102  CO2 18* 19*  GLUCOSE 159* 177*  BUN 26* 29*  CREATININE 1.98* 2.10*  CALCIUM  8.9 8.8*    Intake/Output Summary (Last 24 hours) at 09/09/2024 0959 Last data filed at 09/09/2024 9167 Gross per 24 hour  Intake 240 ml  Output --  Net 240 ml        Physical Exam: Vital Signs Blood pressure 130/82, pulse 99, temperature 98.5 F (36.9 C), temperature source Oral, resp. rate 18, height 5' 3 (1.6 m), weight 57.8 kg, SpO2 100%.   General: awake, alert, appropriate, sitting up some in bed- daughter? At bedside; NAD HENT: conjugate gaze; oropharynx dry CV: regular rhythm; borderline tachycardic rate; no JVD Pulmonary: CTA B/L; no W/R/R- good air movement GI: soft, NT somewhat distended; hypoactive BS Psychiatric: flat and quiet Neurological: sleepy-   Assessment/Plan: 1. Functional deficits which require 3+ hours per day of interdisciplinary therapy in a comprehensive inpatient rehab setting. Physiatrist is providing close team supervision and 24 hour management of active medical problems listed below. Physiatrist and rehab team continue to assess barriers to discharge/monitor patient progress toward functional and medical goals  Care Tool:  Bathing              Bathing assist       Upper Body Dressing/Undressing Upper body dressing        Upper body assist      Lower Body Dressing/Undressing Lower body dressing            Lower body assist       Toileting Toileting    Toileting assist       Transfers Chair/bed transfer  Transfers assist           Locomotion Ambulation   Ambulation assist              Walk 10 feet activity   Assist           Walk 50 feet activity   Assist           Walk 150 feet activity   Assist           Walk 10 feet on  uneven surface  activity   Assist           Wheelchair     Assist               Wheelchair 50 feet with 2 turns activity    Assist            Wheelchair 150 feet activity     Assist          Blood pressure 130/82, pulse 99, temperature 98.5 F (36.9 C), temperature source Oral, resp. rate 18, height 5' 3 (1.6 m), weight 57.8 kg, SpO2 100%.   Medical Problem List and Plan: 1. Functional deficits secondary to left PCA and MCA/PCA watershed territory infarcts             -patient may  shower             -ELOS/Goals: 10-14 days S           Con't CIR  No therapy todday   2.  Antithrombotics: -DVT/anticoagulation:  Mechanical: Sequential compression devices, below knee Bilateral lower extremities Pharmaceutical: Heparin              -antiplatelet therapy: continue Aspirin  81 mg --No DAPT due to recent ICH   3. Pain Management:con't tylenol  as needed   4. Mood/Behavior/Sleep: LCSW to follow for evaluation and support when available.              -antipsychotic agents: N/A              -sleep:    5. Neuropsych/cognition: This patient is not capable of making decisions on her own behalf.   6. Skin/Wound Care: Routine pressure relief measures.    7. AKI on CKD: IVF started at 73mls/hr.    11/27- increased IVFs to 75cc/hour due to mild increase in Cr and BUN- not drinking well- probably made worse by UTI  8.Stroke: left PCA and MCA/PCA watershed territories infarcts with cytotoxic edema, etiology: Likely large vessel disease, probably in the setting of syncope/orthostatic hypotension:              -Continue Asprin    9. Hx recent ICH: continue Lipitor 40 mg daily    10. Hx stroke/TIA: Stroke 2014-no significant residual deficit. Continue statin.    11. Seizure: Episode on 11/22, EEG negative for seizure activity. Loaded with Keppra , now on Vimpat  50 mg bid.    12. 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 Avoid low BP-Longterm BP goal 130-150 systolic given severe stenosis/occlusion of left MI and P1.              -Monitor orthostatic vitals especially with activity.    13.Hx of HTN: continue home regime- Norvac and Valsartan .  Same as above.    14. HLD: was on Pitavastatin  at home, continue Atorvastatin  40 mg daily.    15.  Fever of unk origin: Intermittent fevers continue ranging Tmax 100-100.9.  Repeat Chest xray/urinalysis/Blood cultures and venous doppler and CT abd/pelvis d/t recent ICH. UA shows moderate leukocytes: keflex  started, f/u UC/BC/above imaging   11/27- CT- went over with pt/family- looks OK except constipation and subcentimeter hypodensity in liver 16. Tachycardia Toprol  XL started HS   11/27- will monitor for OH since been having issues- HR down to 99 this AM  -heart rate was 109 last night triggering yellow MEWS- has been green since then 17. Leukocytosis: continue to trend  11/27- WBC down to 13.7- on keflex - Doesn' thave urine Cx that was done- if Sx's don't improve/WBC doesn't resolve, will need Urine Cx- CBC in AM 18. Constipation  11/27- LBM 4-5+ days ago- will give Sorbitol  30cc- and if no results, will give SSE- ordered   I spent a total of  52  minutes on total care today- >50% coordination of care- due to  D/w pt and daughter; with nursing- reviewed CT ind and results- also d/w family- d/w nursing about B/B- sorbitol  and SSE- and increase in IVFs- and UTI    LOS: 1 days A FACE TO FACE EVALUATION WAS PERFORMED  Yanin Muhlestein 09/09/2024, 9:59 AM

## 2024-09-09 NOTE — Progress Notes (Signed)
 Inpatient Rehabilitation Admission Medication Review by a Pharmacist  A complete drug regimen review was completed for this patient to identify any potential clinically significant medication issues.  High Risk Drug Classes Is patient taking? Indication by Medication  Antipsychotic Yes, as an intravenous medication Compazine  - nausea   Anticoagulant Yes Heparin  SQ - VTE prophylaxis   Antibiotic Yes Cephalexin  - fever, possible UTI  Opioid No   Antiplatelet Yes Aspirin  - CVA   Hypoglycemics/insulin No   Vasoactive Medication Yes Amlodipine , metoprolol  XL - HTN  Chemotherapy No   Other Yes Acetaminophen - pain Atorvastatin  - HLD Bisacodyl ,  MOM enema- prn constipation Calcium (tuMS) -prn indigestion, heartburn Diphenhydramine  - itching Robitussin DM - cough Lacosamide  - concern for seizure in setting of recent ICH Simethicone  - llatulence Trazodone  - sleep     Type of Medication Issue Identified Description of Issue Recommendation(s)  Drug Interaction(s) (clinically significant)     Duplicate Therapy     Allergy     No Medication Administration End Date     Incorrect Dose     Additional Drug Therapy Needed     Significant med changes from prior encounter (inform family/care partners about these prior to discharge). PTA meds: Oxycodone  discontinued and Valsartan  paused on [facility] acute care discharge.  Not resumed on CIR admit. Protonix , miralax , senokot-S , melatonin, Vitamin D3, kenalog  cream were not resumed or CIR admit. Restart PTA meds when and if necessary during CIR admission or at time of discharge, if warranted   Communicate to patient /family/ caregiver prior to discharge.  Other       Clinically significant medication issues were identified that warrant physician communication and completion of prescribed/recommended actions by midnight of the next day:  No  Name of provider notified for urgent issues identified:   Provider Method of Notification:      Pharmacist comments: Neurology recommended aspirin  but no DAPT given ICH history.   Time spent performing this drug regimen review (minutes):  20   Levorn Gaskins, [place] Clinical Pharmacist 09/09/2024 9:17 AM

## 2024-09-09 NOTE — Progress Notes (Signed)
 BLE venous duplex has been completed.   Results can be found under chart review under CV PROC. 09/09/2024 2:36 PM Quy Lotts RVT, RDMS

## 2024-09-09 NOTE — Plan of Care (Signed)
  Problem: Consults Goal: RH STROKE PATIENT EDUCATION Description: See Patient Education module for education specifics  Outcome: Progressing   Problem: RH BOWEL ELIMINATION Goal: RH STG MANAGE BOWEL WITH ASSISTANCE Description: STG Manage Bowel with mod I  Assistance. Outcome: Progressing Goal: RH STG MANAGE BOWEL W/MEDICATION W/ASSISTANCE Description: STG Manage Bowel with Medication with mod I Assistance. Outcome: Progressing   Problem: RH SAFETY Goal: RH STG ADHERE TO SAFETY PRECAUTIONS W/ASSISTANCE/DEVICE Description: STG Adhere to Safety Precautions With cues Assistance/Device. Outcome: Progressing   Problem: RH KNOWLEDGE DEFICIT Goal: RH STG INCREASE KNOWLEDGE OF HYPERTENSION Description: Patient and sister will be able to manage HTN using educational resources for medications an dietary modification independently Outcome: Progressing Goal: RH STG INCREASE KNOWLEGDE OF HYPERLIPIDEMIA Description: Patient and sister will be able to manage HLD using educational resources for medications an dietary modification independently Outcome: Progressing Goal: RH STG INCREASE KNOWLEDGE OF STROKE PROPHYLAXIS Description: Patient and sister will be able to manage secondary risks using educational resources for medications an dietary modification independently Outcome: Progressing

## 2024-09-09 NOTE — Progress Notes (Signed)
 Pt has received sorbitol  at 1006 and had 2 bm today, 1st large and 2nd medium. No c/o of abd cramping or pain. Soap and suds enema not given d/t 2 bms

## 2024-09-10 DIAGNOSIS — N390 Urinary tract infection, site not specified: Secondary | ICD-10-CM

## 2024-09-10 DIAGNOSIS — I951 Orthostatic hypotension: Secondary | ICD-10-CM

## 2024-09-10 DIAGNOSIS — A499 Bacterial infection, unspecified: Secondary | ICD-10-CM

## 2024-09-10 DIAGNOSIS — N189 Chronic kidney disease, unspecified: Secondary | ICD-10-CM

## 2024-09-10 DIAGNOSIS — N179 Acute kidney failure, unspecified: Secondary | ICD-10-CM

## 2024-09-10 LAB — CBC WITH DIFFERENTIAL/PLATELET
Abs Immature Granulocytes: 0.06 K/uL (ref 0.00–0.07)
Basophils Absolute: 0.1 K/uL (ref 0.0–0.1)
Basophils Relative: 0 %
Eosinophils Absolute: 0.1 K/uL (ref 0.0–0.5)
Eosinophils Relative: 1 %
HCT: 35 % — ABNORMAL LOW (ref 36.0–46.0)
Hemoglobin: 12 g/dL (ref 12.0–15.0)
Immature Granulocytes: 1 %
Lymphocytes Relative: 34 %
Lymphs Abs: 4.3 K/uL — ABNORMAL HIGH (ref 0.7–4.0)
MCH: 29.6 pg (ref 26.0–34.0)
MCHC: 34.3 g/dL (ref 30.0–36.0)
MCV: 86.2 fL (ref 80.0–100.0)
Monocytes Absolute: 1.2 K/uL — ABNORMAL HIGH (ref 0.1–1.0)
Monocytes Relative: 9 %
Neutro Abs: 7 K/uL (ref 1.7–7.7)
Neutrophils Relative %: 55 %
Platelets: 372 K/uL (ref 150–400)
RBC: 4.06 MIL/uL (ref 3.87–5.11)
RDW: 11.9 % (ref 11.5–15.5)
WBC: 12.7 K/uL — ABNORMAL HIGH (ref 4.0–10.5)
nRBC: 0 % (ref 0.0–0.2)

## 2024-09-10 LAB — BASIC METABOLIC PANEL WITH GFR
Anion gap: 11 (ref 5–15)
BUN: 23 mg/dL (ref 8–23)
CO2: 18 mmol/L — ABNORMAL LOW (ref 22–32)
Calcium: 8.6 mg/dL — ABNORMAL LOW (ref 8.9–10.3)
Chloride: 107 mmol/L (ref 98–111)
Creatinine, Ser: 1.72 mg/dL — ABNORMAL HIGH (ref 0.44–1.00)
GFR, Estimated: 33 mL/min — ABNORMAL LOW (ref >60)
Glucose, Bld: 107 mg/dL — ABNORMAL HIGH (ref 70–99)
Potassium: 4.1 mmol/L (ref 3.5–5.1)
Sodium: 136 mmol/L (ref 135–145)

## 2024-09-10 NOTE — Progress Notes (Signed)
 Patient ID: Rhonda Cortez, female   DOB: 1961-09-19, 63 y.o.   MRN: [id number]   Inpatient Rehabilitation Care Coordinator Assessment and Plan Patient Details  Name: Rhonda Cortez MRN: [id number] Date of Birth: September 08, 1961   Today's Date: 09/03/2024   Hospital Problems: Principal Problem:   ICH (intracerebral hemorrhage) (HCC)   Past Medical History:      Past Medical History:  Diagnosis Date   Hypertension     Stroke [facility])          Past Surgical History:       Past Surgical History:  Procedure Laterality Date   OOPHORECTOMY       TUBAL LIGATION            Social History:  reports that she has never smoked. She has never used smokeless tobacco. She reports that she does not drink alcohol and does not use drugs.   Family / Support Systems Marital Status: Single Patient Roles: Parent, Other (Comment) (employee) Children: [place] [phone]  [place] [phone]  Has a daughter and daughter in-law Other Supports: Irish-sister [phone] Anticipated Caregiver: Children to rotate staying with at her home Ability/Limitations of Caregiver: Children do work but will work something out regarding this. Hopefully this will be short term Caregiver Availability: 24/7 Family Dynamics: Close with three children and sibling. 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 Return to Work Plans: Will need to recover to be able to return to work and will have additional therapies after rehab stay Legal History/Current Legal Issues: NA Guardian/Conservator: None-according to MD pt is capable of making her  own decisions while here    Abuse/Neglect Abuse/Neglect Assessment Can Be Completed: Yes Physical Abuse: Denies Verbal Abuse: Denies Sexual Abuse: Denies Exploitation of patient/patient's resources: Denies Self-Neglect: Denies   Patient response to: Social Isolation - How often do you feel lonely or isolated from those around you?: Never   Emotional Status Pt's affect, behavior and adjustment status: Pt is quite sleepy today did not sleep well last night. But is lethargic from her bleed and she is working on this. She hopes to be as close to mod/i as possible when she leaves here. She has always been independent and taken care of herself Recent Psychosocial Issues: other health issues Psychiatric History: No history would benefit from seeing neuro-psych while here, due to age and independence prior to admission Substance Abuse History: NA   Patient / Family Perceptions, Expectations & Goals Pt/Family understanding of illness & functional limitations: Pt and son can explain her brain bleed both are hopeful she will do well and recover from this. Pt has done better and the hopes is this will continue while here Premorbid pt/family roles/activities: mom, sister, employee, friend, co-worker, etc Anticipated changes in roles/activities/participation: resume Pt/family expectations/goals: Pt states:  I want to do well here and do for myself.  Son states:  We hope she does well there.   Community [employer] Agencies: None Premorbid Home Care/DME Agencies: Other (Comment) (tub seat with grab bars) Transportation available at discharge: self and family Is the patient able to respond to transportation needs?: Yes In the past  12 months, has lack of transportation kept you from medical appointments or from getting medications?: No In the past 12 months, has lack of transportation kept you from meetings, work, or from getting things needed for daily living?: No Resource referrals  recommended: Neuropsychology   Discharge Planning Living Arrangements: Alone Support Systems: Children, Other relatives, Friends/neighbors Type of Residence: Private residence Insurance Resources: Media Planner (specify) Haematologist) Financial Resources: Employment Financial Screen Referred: No Living Expenses: Psychologist, Sport And Exercise Management: Patient Does the patient have any problems obtaining your medications?: No Home Management: self Patient/Family Preliminary Plans: Return home with children amd family coming in to provide supervision or assist. Depends upon the therapy evaluations today and will work on discharge needs. Made aware to bring in work forms if needed to be completed. Care Coordinator Barriers to Discharge: Insurance for SNF coverage Care Coordinator Anticipated Follow Up Needs: HH/OP   Clinical Impression Pleasant but lethargic female who is sleepy and at times difficult to arouse. She was independent lived alone and worked prior to admission. Her children plan to rotate to be able to provide 24/7 supervision at discharge. Will await therapy team evaluations.   [name] MATSU 09/03/2024, 11:34 AM  Addendum: pt has returned to CIR will await re-evaluations and work on discharge needs.

## 2024-09-10 NOTE — Progress Notes (Signed)
 Inpatient Rehabilitation Center Individual Statement of Services  Patient Name:  Rhonda Cortez  Date:  09/10/2024  Welcome to the Inpatient Rehabilitation Center.  Our goal is to provide you with an individualized program based on your diagnosis and situation, designed to meet your specific needs.  With this comprehensive rehabilitation program, you will be expected to participate in at least 3 hours of rehabilitation therapies Monday-Friday, with modified therapy programming on the weekends.  Your rehabilitation program will include the following services:  Physical Therapy (PT), Occupational Therapy (OT), Speech Therapy (ST), 24 hour per day rehabilitation nursing, Therapeutic Recreaction (TR), Neuropsychology, Care Coordinator, Rehabilitation Medicine, Nutrition Services, and Pharmacy Services  Weekly team conferences will be held on Wednesday to discuss your progress.  Your Inpatient Rehabilitation Care Coordinator will talk with you frequently to get your input and to update you on team discussions.  Team conferences with you and your family in attendance may also be held.  Expected length of stay: 10-14 Days  Overall anticipated outcome: Supervision with cues  Depending on your progress and recovery, your program may change. Your Inpatient Rehabilitation Care Coordinator will coordinate services and will keep you informed of any changes. Your Inpatient Rehabilitation Care Coordinator's name and contact numbers are listed  below.  The following services may also be recommended but are not provided by the Inpatient Rehabilitation Center:  Driving Evaluations Home Health Rehabiltiation Services Outpatient Rehabilitation Services Vocational Rehabilitation   Arrangements will be made to provide these services after discharge if needed.  Arrangements include referral to agencies that provide these services.  Your insurance has been verified to be:  [employer] health Your primary doctor is:   [facility]  Pertinent information will be shared with your doctor and your insurance company.  Inpatient Rehabilitation Care Coordinator:  Rhoda Clement, KEN [phone] or ELIGAH BASQUES  Information discussed with and copy given to patient by: Clement Asberry MATSU, 09/10/2024, 8:47 AM

## 2024-09-10 NOTE — Plan of Care (Signed)
  Problem: RH Cognition - SLP Goal: RH LTG Patient will demonstrate orientation with cues Description:  LTG:  Patient will demonstrate orientation to person/place/time/situation with cues (SLP)   Flowsheets (Taken 09/10/2024 1351) LTG Patient will demonstrate orientation to:  Person  Place  Time  Situation LTG: Patient will demonstrate orientation using cueing (SLP): Minimal Assistance - Patient > 75%   Problem: RH Comprehension Communication Goal: LTG Patient will comprehend basic/complex auditory (SLP) Description: LTG: Patient will comprehend basic/complex auditory information with cues (SLP). Flowsheets (Taken 09/10/2024 1351) LTG: Patient will comprehend: Complex auditory information LTG: Patient will comprehend auditory information with cueing (SLP): Minimal Assistance - Patient > 75%   Problem: RH Expression Communication Goal: LTG Patient will increase word finding of common (SLP) Description: LTG:  Patient will increase word finding of common objects/daily info/abstract thoughts with cues using compensatory strategies (SLP). Flowsheets (Taken 09/10/2024 1351) LTG: Patient will increase word finding of common (SLP): Minimal Assistance - Patient > 75% Patient will use compensatory strategies to increase word finding of: Daily info   Problem: RH Attention Goal: LTG Patient will demonstrate this level of attention during functional activites (SLP) Description: LTG:  Patient will will demonstrate this level of attention during functional activites (SLP) Flowsheets (Taken 09/10/2024 1351) Patient will demonstrate during cognitive/linguistic activities the attention type of: Sustained LTG: Patient will demonstrate this level of attention during cognitive/linguistic activities with assistance of (SLP): Minimal Assistance - Patient > 75%

## 2024-09-10 NOTE — Progress Notes (Signed)
 PROGRESS NOTE   Subjective/Complaints:  Pt in bed. Comfortable. Daughter at bedside. No issues this morning. We discussed her need to push fluids and po intake.   ROS: limited due to language/communication   Objective:   VAS US  LOWER EXTREMITY VENOUS (DVT) Result Date: 09/10/2024  Lower Venous DVT Study Patient Name:  CECILE GILLISPIE  Date of Exam:   09/09/2024 Medical Rec #: [id number]    Accession #:    [id number] Date of Birth: [birth year]    Patient Gender: F Patient Age:   63 years Exam Location:  [place] Procedure:      VAS US  LOWER EXTREMITY VENOUS (DVT) Referring Phys: DAPHNE SATTERFIELD --------------------------------------------------------------------------------  Indications: Fever. Other Indications: Rehab patient. Comparison Study: No previous exams Performing Technologist: Jody Hill RVT, RDMS  Examination Guidelines: A complete evaluation includes B-mode imaging, spectral Doppler, color Doppler, and power Doppler as needed of all accessible portions of each vessel. Bilateral testing is considered an integral part of a complete examination. Limited examinations for reoccurring indications may be performed as noted. The reflux portion of the exam is performed with the patient in reverse Trendelenburg.  +---------+---------------+---------+-----------+----------+--------------+ RIGHT    CompressibilityPhasicitySpontaneityPropertiesThrombus Aging +---------+---------------+---------+-----------+----------+--------------+ CFV      Full           Yes      Yes                                 +---------+---------------+---------+-----------+----------+--------------+ SFJ      Full                                                        +---------+---------------+---------+-----------+----------+--------------+ FV Prox  Full           Yes      Yes                                  +---------+---------------+---------+-----------+----------+--------------+ FV Mid   Full           Yes      Yes                                 +---------+---------------+---------+-----------+----------+--------------+ FV DistalFull           Yes      Yes                                 +---------+---------------+---------+-----------+----------+--------------+ PFV      Full                                                        +---------+---------------+---------+-----------+----------+--------------+  POP      Full           Yes      Yes                                 +---------+---------------+---------+-----------+----------+--------------+ PTV      Full                                                        +---------+---------------+---------+-----------+----------+--------------+ PERO     Full                                                        +---------+---------------+---------+-----------+----------+--------------+   +---------+---------------+---------+-----------+----------+--------------+ LEFT     CompressibilityPhasicitySpontaneityPropertiesThrombus Aging +---------+---------------+---------+-----------+----------+--------------+ CFV      Full           Yes      Yes                                 +---------+---------------+---------+-----------+----------+--------------+ SFJ      Full                                                        +---------+---------------+---------+-----------+----------+--------------+ FV Prox  Full           Yes      Yes                                 +---------+---------------+---------+-----------+----------+--------------+ FV Mid   Full           Yes      Yes                                 +---------+---------------+---------+-----------+----------+--------------+ FV DistalFull           Yes                                           +---------+---------------+---------+-----------+----------+--------------+ PFV      Full                                                        +---------+---------------+---------+-----------+----------+--------------+ POP      Full           Yes      Yes                                 +---------+---------------+---------+-----------+----------+--------------+ PTV  Full                                                        +---------+---------------+---------+-----------+----------+--------------+ PERO     Full                                                        +---------+---------------+---------+-----------+----------+--------------+     Summary: BILATERAL: - No evidence of deep vein thrombosis seen in the lower extremities, bilaterally. -No evidence of popliteal cyst, bilaterally.   *See table(s) above for measurements and observations. Electronically signed by Norman Serve on 09/10/2024 at 7:14:51 AM.    Final    CT ABDOMEN PELVIS WO CONTRAST Result Date: 09/09/2024 EXAM: CT ABDOMEN AND PELVIS WITHOUT CONTRAST 09/09/2024 05:21:13 AM TECHNIQUE: CT of the abdomen and pelvis was performed without the administration of intravenous contrast. Multiplanar reformatted images are provided for review. Automated exposure control, iterative reconstruction, and/or weight-based adjustment of the mA/kV was utilized to reduce the radiation dose to as low as reasonably achievable. COMPARISON: None available. CLINICAL HISTORY: Sepsis FINDINGS: LOWER CHEST: Lung bases are clear of infiltrates with asymmetric posterior atelectasis in the right lower lobe. There is mild elevation in the right hemidiaphragm. The cardiac size is normal. LIVER: The liver is 18 cm in length with mild steatosis. No masses seen without contrast. There is a 5 mm too small to characterize hypodensity in segment 7 on axial image 12 of series 3. No follow-up imaging is needed in a low-risk patient. In a high-risk  patient, consider 18-month follow-up liver-dedicated MRI. The remaining liver is homogeneous. GALLBLADDER AND BILE DUCTS: Gallbladder is unremarkable. No biliary ductal dilatation. SPLEEN: No acute abnormality. PANCREAS: No acute abnormality. ADRENAL GLANDS: There is no adrenal mass. KIDNEYS, URETERS AND BLADDER: There is a 4.7 cm Bosniak type 1 cyst in the superior pole of the right kidney, Hounsfield density of 17. There is scarring in the right lower pole. No follow-up imaging is recommended. The remainder of the unenhanced kidneys are unremarkable. There is no urinary stone or obstruction. No perinephric or periureteral stranding. Urinary bladder is unremarkable. GI AND BOWEL: The stomach is contracted and unremarkable accounting for contraction. There is no small bowel obstruction or inflammation. An appendix is not seen. There is mild fecal stasis. No evidence of colitis or diverticulitis. PERITONEUM AND RETROPERITONEUM: There is no free fluid, free hemorrhage, free air, or localizing inflammatory process. VASCULATURE: Aorta is normal in caliber. LYMPH NODES: No lymphadenopathy. REPRODUCTIVE ORGANS: There are multiple pelvic phleboliths. Lobulated fibroid uterus with 2 cm calcified fibroid in the body of the uterus to the left. The ovaries are not enlarged. BONES AND SOFT TISSUES: There is subcutaneous fatty stranding in the lower anterior wall consistent with prior subcutaneous injections. There are mild degenerative changes of the lumbar spine. No acute osseous abnormality. IMPRESSION: 1. No septic source identified with noncontrast CT. 2. Subcentimeter hypodensity in the liver. No follow up imaging recommended in a low risk patient; in a high risk patient, 70-month follow-up a liver-dedicated MRI would be recommended. 3. Constipation. 4. Fibroid uterus. Electronically signed by: Francis Quam MD 09/09/2024 06:15 AM EST RP Workstation: [id number]  DG Chest 2 View Result Date: 09/08/2024 EXAM: 2 VIEW(S)  XRAY OF THE CHEST 09/08/2024 06:06:00 PM COMPARISON: 09/03/2024 CLINICAL HISTORY: Fever. FINDINGS: LUNGS AND PLEURA: No focal pulmonary opacity. No pleural effusion. No pneumothorax. HEART AND MEDIASTINUM: No acute abnormality of the cardiac and mediastinal silhouettes. BONES AND SOFT TISSUES: No acute osseous abnormality. IMPRESSION: 1. No acute cardiopulmonary process. Electronically signed by: Franky Crease MD 09/08/2024 10:17 PM EST RP Workstation: [id number]   Recent Labs    09/09/24 0630 09/10/24 0608  WBC 13.7* 12.7*  HGB 13.3 12.0  HCT 39.2 35.0*  PLT 402* 372   Recent Labs    09/09/24 0630 09/10/24 0608  NA 136 136  K 4.4 4.1  CL 102 107  CO2 19* 18*  GLUCOSE 177* 107*  BUN 29* 23  CREATININE 2.10* 1.72*  CALCIUM  8.8* 8.6*    Intake/Output Summary (Last 24 hours) at 09/10/2024 1037 Last data filed at 09/10/2024 0956 Gross per 24 hour  Intake 364 ml  Output 175 ml  Net 189 ml        Physical Exam: Vital Signs Blood pressure 130/82, pulse 99, temperature 98.5 F (36.9 C), temperature source Oral, resp. rate 18, height 5' 3 (1.6 m), weight 56.6 kg, SpO2 100%.   Constitutional: No distress . Vital signs reviewed. HEENT: NCAT, EOMI, oral membranes moist Neck: supple Cardiovascular: RRR without murmur. No JVD    Respiratory/Chest: CTA Bilaterally without wheezes or rales. Normal effort    GI/Abdomen: BS +, non-tender, non-distended Ext: no clubbing, cyanosis, or edema Psych: pleasant and cooperative  Neuro: Pt alert. Word finding and processing delays. Right sided facial droop. Speech sl dysarthric. Mild right HP with decreased FMC . Sl decrease in right sided sensory discrimination to light touch. Musc: Full ROM, No pain with AROM or PROM in the neck, trunk, or extremities. Posture appropriate    Assessment/Plan: 1. Functional deficits which require 3+ hours per day of interdisciplinary therapy in a comprehensive inpatient rehab setting. Physiatrist is  providing close team supervision and 24 hour management of active medical problems listed below. Physiatrist and rehab team continue to assess barriers to discharge/monitor patient progress toward functional and medical goals  Care Tool:  Bathing              Bathing assist       Upper Body Dressing/Undressing Upper body dressing        Upper body assist      Lower Body Dressing/Undressing Lower body dressing            Lower body assist       Toileting Toileting    Toileting assist       Transfers Chair/bed transfer  Transfers assist           Locomotion Ambulation   Ambulation assist              Walk 10 feet activity   Assist           Walk 50 feet activity   Assist           Walk 150 feet activity   Assist           Walk 10 feet on uneven surface  activity   Assist           Wheelchair     Assist               Wheelchair 50 feet with 2 turns activity    Assist  Wheelchair 150 feet activity     Assist          Blood pressure 130/82, pulse 99, temperature 98.5 F (36.9 C), temperature source Oral, resp. rate 18, height 5' 3 (1.6 m), weight 56.6 kg, SpO2 100%.   Medical Problem List and Plan: 1. 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  Antithrombotics: -DVT/anticoagulation:  Mechanical: Sequential compression devices, below knee Bilateral lower extremities  -dopplers negative Pharmaceutical: Heparin              -antiplatelet therapy: continue Aspirin  81 mg --No DAPT due to recent ICH   3. Pain Management:con't tylenol  as needed   4. Mood/Behavior/Sleep: LCSW to follow for evaluation and support when available.              -antipsychotic agents: N/A              -sleep:    5.  Neuropsych/cognition: This patient is not capable of making decisions on her own behalf.   6. Skin/Wound Care: Routine pressure relief measures.    7. AKI on CKD: IVF started at 40mls/hr.    11/28 labs improved today---dc IVF, push po, continue to follow labs serially  9. Hx recent ICH: continue Lipitor 40 mg daily    10. Hx stroke/TIA: Stroke 2014-no significant residual deficit. Continue statin.    11. Seizure: Episode on 11/22, EEG negative for seizure activity. Loaded with Keppra , now on Vimpat  50 mg bid.    12. 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 Avoid low BP-Longterm BP goal 130-150 systolic given severe stenosis/occlusion of left MI and P1.              -no orthostatics being done--check daily x 2 days .    13.Hx of HTN: continue home regime- Norvac and Valsartan .  Same as above.    14. HLD: was on Pitavastatin  at home, continue Atorvastatin  40 mg daily.    15.  Fever/leukocytosis: Intermittent fevers   -Repeat Chest xray/urinalysis/Blood cultures and venous doppler and CT abd/pelvis d/t recent ICH.     11/27- CT- went over with pt/family- looks OK except constipation and subcentimeter hypodensity in liver  11/28 WBC's ?trending down --12.7   -UA +, empiric keflex  on board, UCX reincubated   -Tmx 100.7, HR in 90's this morning. Appeared fairly robust on exam   -recheck cbc in am 16. Tachycardia Toprol  XL started HS   11/27- will monitor for OH since been having issues- HR down to 99 this AM  -11/28 may be due to above 18. Constipation  11/27- LBM after sorbitol         LOS: 2 days A FACE TO FACE EVALUATION WAS PERFORMED  Arthea ONEIDA Gunther 09/10/2024, 10:37 AM

## 2024-09-10 NOTE — Evaluation (Signed)
 Physical Therapy Assessment and Plan  Patient Details  Name: Rhonda Cortez MRN: [id number] Date of Birth: 15-Jul-1961  PT Diagnosis: Difficulty walking, Dizziness and giddiness, Impaired cognition, Muscle weakness, and Pain in head and back Rehab Potential: Good ELOS: 10-14 days   Today's Date: 09/10/2024 PT Individual Time: 8954-8844 PT Individual Time Calculation (min): 70 min    Hospital Problem: Principal Problem:   Acute ischemic left PCA stroke [facility])   Past Medical History:  Past Medical History:  Diagnosis Date   Hypertension    Stroke [facility])    Past Surgical History:  Past Surgical History:  Procedure Laterality Date   OOPHORECTOMY     TUBAL LIGATION      Assessment & Plan Clinical Impression: Patient is a 63 y/o female with PMH of HTN who presented to [facility] on 11/14 with AMS. Per family she was supposed to arrive at a friend's home around 7pm that night but did not arrive. When pt answered her phone around 3AM she was quite confused. In ED head CT showed acute intracranial hemorrhage from the left caudate region with extension into the intraventricular region on the left. Repeat head CT showed intraventricular extension of hemorrhage. MRI confirmed. 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 Neurology recommended no antithrombotic 2/2 ICH . She was recommended for CIR and transferred on 11/20. On 11/22 she was ambulating to the BR with supervision and while on the toilet her legs became rigid and she had minimal verbal output. She was transferred to acute for further workup. CT demonstrated a resolving left caudate and left lateral ventricular hemorrhage without new hemorrhage. 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 She was started on aspirin  but no  DAPT due to recent IVH/ICH. Concern for hypotension and long term SBP 130-150. She was switched to vimpat  from keppra . Therapy evaluations completed and pt was recommended to return to CIR to complete the program . Patient transferred to CIR on 09/08/2024 .   Patient currently requires mod with mobility secondary to muscle weakness and muscle joint tightness, decreased cardiorespiratoy endurance, unbalanced muscle activation, motor apraxia, decreased coordination, and decreased motor planning, decreased midline orientation and decreased attention to right, decreased initiation, decreased attention, decreased awareness, decreased problem solving, decreased safety awareness, and decreased memory, and decreased sitting balance, decreased standing balance, decreased postural control, and decreased balance strategies.  Prior to hospitalization, patient was independent  with mobility and lived with Significant other in a House home.  Home access is 4-5 front entrance with rails, 1 step back entrance (usual entrance) no railStairs to enter.  Patient will benefit from skilled PT intervention to maximize safe functional mobility, minimize fall risk, and decrease caregiver burden for planned discharge home with 24 hour supervision.  Anticipate patient will benefit from follow up OP at discharge.  PT - End of Session Activity Tolerance: Decreased this session Endurance Deficit: Yes PT Assessment Rehab Potential (ACUTE/IP ONLY): Good PT Barriers to Discharge: None PT Patient demonstrates impairments in the following area(s): Balance;Endurance;Motor;Pain;Perception;Safety;Skin Integrity PT Transfers Functional Problem(s): Bed Mobility;Bed to Chair;Furniture;Car PT Locomotion Functional Problem(s): Ambulation;Wheelchair Mobility;Stairs PT Plan PT Intensity: Minimum of 1-2 x/day ,45 to 90 minutes PT Frequency: 5 out of 7 days PT Duration Estimated Length of Stay: 10-14 days PT Treatment/Interventions:  Ambulation/gait training;Balance/vestibular training;Cognitive remediation/compensation;Community reintegration;Discharge  planning;Disease management/prevention;DME/adaptive equipment instruction;Functional mobility training;Neuromuscular re-education;Functional electrical stimulation;Pain management;Patient/family education;Psychosocial support;Skin care/wound management;Splinting/orthotics;Stair training;Therapeutic Activities;Therapeutic Exercise;UE/LE Strength taining/ROM;UE/LE Coordination activities;Visual/perceptual remediation/compensation;Wheelchair propulsion/positioning PT Transfers Anticipated Outcome(s): supervision overall PT Locomotion Anticipated Outcome(s): supervision ambulatory and stiars PT Recommendation Recommendations for Other Services: Neuropsych consult;Therapeutic Recreation consult Therapeutic Recreation Interventions: Warehouse Manager;Outing/community reintergration Follow Up Recommendations: Outpatient PT;24 hour supervision/assistance Patient destination: Home Equipment Recommended: To be determined   PT Evaluation Precautions/Restrictions Precautions Precautions: Fall Restrictions Weight Bearing Restrictions Per Provider Order: No  Pain Pain Assessment Pain Score: 0-No pain Reports she was having back and head pain before. Pain Interference Pain Interference Pain Effect on Sleep: 2. Occasionally Pain Interference with Therapy Activities: 1. Rarely or not at all Pain Interference with Day-to-Day Activities: 1. Rarely or not at all Home Living/Prior Functioning Home Living Available Help at Discharge: Family;Available 24 hours/day Type of Home: House Home Access: Stairs to enter [employer] of Steps: 4-5 front entrance with rails, 1 step back entrance (usual entrance) no rail Home Layout: One level Bathroom Shower/Tub: Walk-in shower;Tub/shower unit  Lives With: Significant other Prior Function Level of Independence:  Independent with basic ADLs;Independent with homemaking with ambulation;Independent with gait;Independent with transfers  Able to Take Stairs?: Yes Driving: Yes Vocation: Full time employment Leisure: Hobbies-yes (Comment) (gardening) Vision/Perception  Vision - History Ability to See in Adequate Light: 1 Impaired Vision - Assessment Eye Alignment: Within Functional Limits Ocular Range of Motion: Within Functional Limits Alignment/Gaze Preference: Within Defined Limits Tracking/Visual Pursuits: Decreased smoothness of horizontal tracking;Decreased smoothness of vertical tracking;Requires cues, head turns, or add eye shifts to track Saccades: Within functional limits;Other (comment) (eye fatigue after 3 rotations) Perception Perception: Impaired Preception Impairment Details: Spatial orientation (midline orientation) Praxis Praxis: Impaired Praxis Impairment Details: Motor planning Praxis-Other Comments: pt has functional praxis with ADL tasks, but having great difficulty with motor planning as she was taking steps with RW  Cognition Overall Cognitive Status: Impaired/Different from baseline Arousal/Alertness: Awake/alert Orientation Level: Oriented to place;Oriented to situation;Oriented to person;Disoriented to time Year:  (2026) Month: June Day of Week: Incorrect Attention: Sustained Sustained Attention: Impaired Sustained Attention Impairment: Verbal basic Selective Attention: Impaired Selective Attention Impairment: Verbal basic;Functional basic Memory: Impaired Memory Impairment: Storage deficit;Decreased recall of new information Awareness: Impaired Awareness Impairment: Intellectual impairment Problem Solving: Impaired Problem Solving Impairment: Verbal basic;Functional basic Initiating: Impaired Safety/Judgment: Impaired Sensation Sensation Light Touch: Appears Intact Hot/Cold: Appears Intact Proprioception: Impaired by gross assessment (difficulty with placement  of RLE on floor, finding base of support) Stereognosis: Not tested Coordination Gross Motor Movements are Fluid and Coordinated: No Fine Motor Movements are Fluid and Coordinated: Yes Coordination and Movement Description: LLE tremors,  and RLE ataxia when taking steps with RW Motor  Motor Motor: Abnormal postural alignment and control;Motor impersistence   Trunk/Postural Assessment  Cervical Assessment Cervical Assessment: Within Functional Limits Thoracic Assessment Thoracic Assessment: Within Functional Limits Lumbar Assessment Lumbar Assessment:  (posteror tilt) Postural Control Postural Control: Deficits on evaluation Trunk Control: R lean in standing/gait especially as fatigues Protective Responses: R lateral lean with fatigue, delayed responses  Balance Balance Balance Assessed: Yes Static Sitting Balance Static Sitting - Level of Assistance: 5: Stand by assistance Dynamic Sitting Balance Dynamic Sitting - Level of Assistance: 4: Min assist;5: Stand by assistance Static Standing Balance Static Standing - Level of Assistance: 4: Min assist Dynamic Standing Balance Dynamic Standing - Level of Assistance: 4: Min assist;3: Mod assist Extremity Assessment  RUE Assessment RUE Assessment: Within Functional Limits LUE Assessment LUE Assessment: Within Functional  Limits RLE Assessment RLE Assessment: Exceptions to [facility] General Strength Comments: grossly WFL with testing, does demonstrate decreased functional strength with gait as fatigues, decreased hip flexion activation and DF LLE Assessment LLE Assessment: Exceptions to [facility] General Strength Comments: grossly WFL but shaking/tremors noted during MMT assessment  Care Tool Care Tool Bed Mobility Roll left and right activity   Roll left and right assist level: Supervision/Verbal cueing    Sit to lying activity   Sit to lying assist level: Supervision/Verbal cueing    Lying to sitting on side of bed activity   Lying to  sitting on side of bed assist level: the ability to move from lying on the back to sitting on the side of the bed with no back support.: Contact Guard/Touching assist     Care Tool Transfers Sit to stand transfer   Sit to stand assist level: Minimal Assistance - Patient > 75%    Chair/bed transfer   Chair/bed transfer assist level: Minimal Assistance - Patient > 75%    Car transfer   Car transfer assist level: Moderate Assistance - Patient 50 - 74%      Care Tool Locomotion Ambulation   Assist level: Moderate Assistance - Patient 50 - 74% Assistive device: No Device Max distance: 50'  Walk 10 feet activity   Assist level: Moderate Assistance - Patient - 50 - 74% Assistive device: No Device   Walk 50 feet with 2 turns activity   Assist level: Moderate Assistance - Patient - 50 - 74% Assistive device: No Device  Walk 150 feet activity Walk 150 feet activity did not occur: Safety/medical concerns (endurnace)      Walk 10 feet on uneven surfaces activity   Assist level: Minimal Assistance - Patient > 75% Assistive device: Walker-rolling  Stairs   Assist level: Minimal Assistance - Patient > 75% Stairs assistive device: 2 hand rails Max number of stairs: 4  Walk up/down 1 step activity   Walk up/down 1 step (curb) assist level: Minimal Assistance - Patient > 75% Walk up/down 1 step or curb assistive device: 2 hand rails  Walk up/down 4 steps activity   Walk up/down 4 steps assist level: Minimal Assistance - Patient > 75% Walk up/down 4 steps assistive device: 2 hand rails  Walk up/down 12 steps activity Walk up/down 12 steps activity did not occur: Safety/medical concerns      Pick up small objects from floor   Pick up small object from the floor assist level: Minimal Assistance - Patient > 75%    Wheelchair Is the patient using a wheelchair?: Yes Type of Wheelchair: Manual   Wheelchair assist level: Dependent - Patient 0%    Wheel 50 feet with 2 turns activity    Assist Level: Dependent - Patient 0%  Wheel 150 feet activity   Assist Level: Dependent - Patient 0%    Refer to Care Plan for Long Term Goals  SHORT TERM GOAL WEEK 1 PT Short Term Goal 1 (Week 1): Pt will perform transfers with CGA PT Short Term Goal 2 (Week 1): Pt will demonstrate dynamic standing balance with CGA PT Short Term Goal 3 (Week 1): Pt will perform gait x 150' with CGA PT Short Term Goal 4 (Week 1): Pt will perform stairs with CGA  Recommendations for other services: Neuropsych and Therapeutic Recreation  Kitchen group, Stress management, and Outing/community reintegration  Skilled Therapeutic Intervention  Evaluation completed (see details above and below) with education on PT POC and goals and individual  treatment initiated with focus on functional transfers, gait, stairs, simulated car transfer, and gait over uneven surfaces. Extra time for all tasks needed and requires cues for sequencing, initiation, and overall technique with all mobility. Pt performed transfers with min to mod A overall without device, at times with posterior lean or decreased postural control with R lateral lean as fatigued in standing. Gait without device as described below with increasing difficulty with RLE clearance and R lean as fatigued. Use of RW with min A overall but still decreased heel strike and foot clearance noted. Tremors in LLE noted during MMT present at times throughout session. Pt request to return back to bed at end of session performed transfer with min A overall and returned to supine position with all needs in reach.  Five times Sit to Stand Test (FTSS) Method: Use a straight back chair with a solid seat that is 16-18" high. Ask participant to sit on the chair with arms folded across their chest.   Instructions: "Stand up and sit down as quickly as possible 5 times, keeping your arms folded across your chest."   Measurement: Stop timing when the participant stands the 5th  time.  TIME: ___75___ (in seconds)  Times > 13.6 seconds is associated with increased disability and morbidity (Guralnik, 2000) Times > 15 seconds is predictive of recurrent falls in healthy individuals aged 54 and older (Buatois, et al., 2008) Normal performance values in community dwelling individuals aged 64 and older (Bohannon, 2006): 60-69 years: 11.4 seconds 70-79 years: 12.6 seconds 80-89 years: 14.8 seconds  MCID: >= 2.3 seconds for Vestibular Disorders (Meretta, 2006)   Mobility Bed Mobility Bed Mobility: Rolling Left;Supine to Sit;Sit to Supine Rolling Right: Supervision/verbal cueing Supine to Sit: Contact Guard/Touching assist Sit to Supine: Supervision/Verbal cueing Transfers Transfers: Sit to Stand;Stand to Sit;Stand Pivot Transfers Sit to Stand: Minimal Assistance - Patient > 75% Stand to Sit: Minimal Assistance - Patient > 75% Stand Pivot Transfers: Minimal Assistance - Patient > 75% Stand Pivot Transfer Details: Verbal cues for technique;Verbal cues for precautions/safety;Manual facilitation for weight shifting Locomotion  Gait Gait Distance (Feet): 50 Feet Assistive device: None Gait Gait Pattern: Impaired (decreased clearance on RLE, R lean as fatigued) Stairs / Additional Locomotion Stairs: Yes Stairs Assistance: Minimal Assistance - Patient > 75% Stair Management Technique: Two rails;Alternating pattern;Step to pattern Number of Stairs: 4 Height of Stairs: 6 Ramp: Minimal Assistance - Patient >75% Wheelchair Mobility Wheelchair Mobility: No   Discharge Criteria: Patient will be discharged from PT if patient refuses treatment 3 consecutive times without medical reason, if treatment goals not met, if there is a change in medical status, if patient makes no progress towards goals or if patient is discharged from hospital.  The above assessment, treatment plan, treatment alternatives and goals were discussed and mutually agreed upon: by patient and by  family  Elnor Donald Sherrell Donald WENDI Elnor, PT, DPT, CBIS  09/10/2024, 2:26 PM

## 2024-09-10 NOTE — Evaluation (Signed)
 Speech Language Pathology Assessment and Plan  Patient Details  Name: Rhonda Cortez MRN: [id number] Date of Birth: February 03, 1961  SLP Diagnosis: Aphasia;Cognitive Impairments  Rehab Potential: Good ELOS: 12-14 days    Today's Date: 09/10/2024 SLP Individual Time: 8764-8664 SLP Individual Time Calculation (min): 60 min   Hospital Problem: Principal Problem:   Acute ischemic left PCA stroke [facility])  Past Medical History:  Past Medical History:  Diagnosis Date   Hypertension    Stroke [facility])    Past Surgical History:  Past Surgical History:  Procedure Laterality Date   OOPHORECTOMY     TUBAL LIGATION      Assessment / Plan / Recommendation Clinical Impression HPI: Pt is a 63 y/o female with PMH of HTN who presented to [facility] on 11/14 with AMS. Per family she was supposed to arrive at a friend's home around 7pm that night but did not arrive. When pt answered her phone around 3AM she was quite confused. In ED head CT showed acute intracranial hemorrhage from the left caudate region with extension into the intraventricular region on the left. Repeat head CT showed intraventricular extension of hemorrhage. MRI confirmed. 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 Neurology recommended no antithrombotic 2/2 ICH . She was recommended for CIR and transferred on 11/20. On 11/22 she was ambulating to the BR with supervision and while on the toilet her legs became rigid and she had minimal verbal output. She was transferred to acute for further workup. CT demonstrated a resolving left caudate and left lateral ventricular hemorrhage without new hemorrhage. 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 She was started on aspirin  but no DAPT due to recent IVH/ICH. Concern for hypotension  and long term SBP 130-150. She was switched to vimpat  from keppra . Therapy evaluations completed and pt was recommended to return to CIR to complete the program.   Clinical Impression:  Bedside Swallow Evaluation: A bedside swallow evaluation was completed to assess for s/sx of oropharyngeal dysphagia. Oral mechanism exam functional. POs administered included thin liquids and solids. Patient with mildly prolonged mastication and complete oral clearance. No s/sx of aspiration present. Recommend regular/thin diet with use of standardized precautions including sitting upright during PO and taking small bites/sips at a slow rate. Cognitive-Linguistic: Patient was evaluated via portions of the Western Aphasia Battery and informal assessment. Patient presents with moderate expressive > receptive aphasia. Patient oriented to self, situation and location, however disoriented to time. Expression is characterized by intact repetition and minA for verbalizing days of the week/months of the year. Patient can independently verbalize name and numbers 1-10. Patient with 40% accuracy given maxA (phonemic & semantic cues) during confrontational naming task and 20% accuracy during responsive naming tasks - often answering with paraphasias. Receptive language is remarkable for 100% accuracy during simple yes/no questions and single step commands, however accuracy decreases as complexity increases. Patient with significant difficulty during auditory word recognition tasks. Patient unable to write name given model. Patient benefited from extra time for processing throughout evaluation. Unable to truly assess cognition due to language deficits, however patient with reduced sustained attention, problem solving and long term memory.  Pt would benefit from skilled ST services to maximize communication and cognition in order to maximize functional independence at d/c. Anticipate patient will require 24 hour supervision  at d/c and f/u  SLP services.    Skilled Therapeutic Interventions          Patient evaluated using portions of a standardized cognitive linguistic assessment and bedside swallow evaluation to assess current cognitive, communicative and swallowing function. See above for details.    SLP Assessment  Patient will need skilled Speech Lanaguage Pathology Services during CIR admission    Recommendations  SLP Diet Recommendations: Age appropriate regular solids;Thin Liquid Administration via: Cup;Straw Supervision: Intermittent supervision to cue for compensatory strategies Compensations: Slow rate;Small sips/bites Postural Changes and/or Swallow Maneuvers: Seated upright 90 degrees Oral Care Recommendations: Oral care BID Patient destination: Home Follow up Recommendations: Outpatient SLP Equipment Recommended: None recommended by SLP    SLP Frequency 3 to 5 out of 7 days   SLP Duration  SLP Intensity  SLP Treatment/Interventions 12-14 days  Minumum of 1-2 x/day, 30 to 90 minutes  Cognitive remediation/compensation;Internal/external aids;Speech/Language facilitation;Therapeutic Activities;Cueing hierarchy;Functional tasks;Patient/family education;Multimodal communication approach    Pain None reported   SLP Evaluation Cognition Overall Cognitive Status: Impaired/Different from baseline Arousal/Alertness: Awake/alert Orientation Level: Oriented to place;Oriented to situation;Oriented to person;Disoriented to time Year:  (2026) Month: June Day of Week: Incorrect Attention: Sustained Sustained Attention: Impaired Sustained Attention Impairment: Verbal basic Selective Attention: Impaired Selective Attention Impairment: Verbal basic;Functional basic Memory: Impaired Memory Impairment: Storage deficit;Decreased recall of new information Awareness: Impaired Awareness Impairment: Intellectual impairment Problem Solving: Impaired Problem Solving Impairment: Verbal basic;Functional  basic Initiating: Impaired Safety/Judgment: Impaired  Comprehension Auditory Comprehension Overall Auditory Comprehension: Impaired Yes/No Questions: Impaired Basic Biographical Questions: 76-100% accurate Basic Immediate Environment Questions: 75-100% accurate Complex Questions: 50-74% accurate Commands: Impaired One Step Basic Commands: 75-100% accurate Two Step Basic Commands: 50-74% accurate Multistep Basic Commands: 25-49% accurate Conversation: Simple Interfering Components: Attention;Processing speed EffectiveTechniques: Extra processing time;Repetition;Visual/Gestural cues Expression Expression Primary Mode of Expression: Verbal Verbal Expression Overall Verbal Expression: Impaired Initiation: No impairment Automatic Speech: Name;Counting;Day of week;Month of year Level of Generative/Spontaneous Verbalization: Phrase Repetition: No impairment Naming: Impairment Responsive: 26-50% accurate Confrontation: Impaired Verbal Errors: Semantic paraphasias;Perseveration;Not aware of errors;Aware of errors Pragmatics: Impairment Impairments: Abnormal affect;Monotone;Eye contact Interfering Components: Attention Effective Techniques: Semantic cues;Phonemic cues Written Expression Dominant Hand: Right Oral Motor Oral Motor/Sensory Function Overall Oral Motor/Sensory Function: Within functional limits Motor Speech Overall Motor Speech: Appears within functional limits for tasks assessed Respiration: Within functional limits Phonation: Low vocal intensity Resonance: Within functional limits Articulation: Within functional limitis Intelligibility: Intelligible Motor Planning: Within functional limits  Care Tool Care Tool Cognition Ability to hear (with hearing aid or hearing appliances if normally used Ability to hear (with hearing aid or hearing appliances if normally used): 0. Adequate - no difficulty in normal conservation, social interaction, listening to TV    Expression of Ideas and Wants Expression of Ideas and Wants: 2. Frequent difficulty - frequently exhibits difficulty with expressing needs and ideas   Understanding Verbal and Non-Verbal Content Understanding Verbal and Non-Verbal Content: 3. Usually understands - understands most conversations, but misses some part/intent of message. Requires cues at times to understand  Memory/Recall Ability Memory/Recall Ability : That he or she is in a hospital/hospital unit    Bedside Swallowing Assessment General Previous Swallow Assessment: 11/25 Diet Prior to this Study: Regular;Thin liquids (Level 0) Behavior/Cognition: Lethargic/Drowsy;Cooperative Oral Cavity - Dentition: Adequate natural dentition Self-Feeding Abilities: Able to feed self Vision: Functional for self-feeding Patient Positioning: Upright in bed Volitional Swallow: Able to elicit  Ice Chips Ice chips: Not tested Thin Liquid Thin Liquid: Within functional limits  Presentation: Self Fed;Straw Nectar Thick Nectar Thick Liquid: Not tested Honey Thick Honey Thick Liquid: Not tested Puree Puree: Not tested Solid Solid: Within functional limits Presentation: Self Fed BSE Assessment Risk for Aspiration Impact on safety and function: Mild aspiration risk Other Related Risk Factors: Previous CVA  Short Term Goals: Week 1: SLP Short Term Goal 1 (Week 1): Patient will demonstrate sustained attention to functional tasks with mod multimodal A SLP Short Term Goal 2 (Week 1): Patient will verbally communicate wants/needs given mod multimodal A SLP Short Term Goal 3 (Week 1): Patient will answer complex yes/no questions with 90% accuracy given mod multimodal A SLP Short Term Goal 4 (Week 1): Patient will name functional items with 60% accuracy given mod multimodal A  Refer to Care Plan for Long Term Goals  Recommendations for other services: None   Discharge Criteria: Patient will be discharged from SLP if patient refuses  treatment 3 consecutive times without medical reason, if treatment goals not met, if there is a change in medical status, if patient makes no progress towards goals or if patient is discharged from hospital.  The above assessment, treatment plan, treatment alternatives and goals were discussed and mutually agreed upon: by patient  Donyetta Ogletree M.A., CCC-SLP 09/10/2024, 1:46 PM

## 2024-09-10 NOTE — Progress Notes (Signed)
 Inpatient Rehabilitation  Patient information reviewed and entered into eRehab system by [facility]. Karen Kays., CCC/SLP, PPS Coordinator.  Information including medical coding, functional ability and quality indicators will be reviewed and updated through discharge.

## 2024-09-10 NOTE — Plan of Care (Signed)
  Problem: RH Balance Goal: LTG: Patient will maintain dynamic sitting balance (OT) Description: LTG:  Patient will maintain dynamic sitting balance with assistance during activities of daily living (OT) Flowsheets (Taken 09/10/2024 1327) LTG: Pt will maintain dynamic sitting balance during ADLs with: Independent Goal: LTG Patient will maintain dynamic standing with ADLs (OT) Description: LTG:  Patient will maintain dynamic standing balance with assist during activities of daily living (OT)  Flowsheets (Taken 09/10/2024 1327) LTG: Pt will maintain dynamic standing balance during ADLs with: Supervision/Verbal cueing   Problem: Sit to Stand Goal: LTG:  Patient will perform sit to stand in prep for activites of daily living with assistance level (OT) Description: LTG:  Patient will perform sit to stand in prep for activites of daily living with assistance level (OT) Flowsheets (Taken 09/10/2024 1327) LTG: PT will perform sit to stand in prep for activites of daily living with assistance level: Independent with assistive device   Problem: RH Bathing Goal: LTG Patient will bathe all body parts with assist levels (OT) Description: LTG: Patient will bathe all body parts with assist levels (OT) Flowsheets (Taken 09/10/2024 1327) LTG: Pt will perform bathing with assistance level/cueing: Set up assist    Problem: RH Dressing Goal: LTG Patient will perform upper body dressing (OT) Description: LTG Patient will perform upper body dressing with assist, with/without cues (OT). Flowsheets (Taken 09/10/2024 1327) LTG: Pt will perform upper body dressing with assistance level of: Set up assist Goal: LTG Patient will perform lower body dressing w/assist (OT) Description: LTG: Patient will perform lower body dressing with assist, with/without cues in positioning using equipment (OT) Flowsheets (Taken 09/10/2024 1327) LTG: Pt will perform lower body dressing with assistance level of: Supervision/Verbal  cueing   Problem: RH Toileting Goal: LTG Patient will perform toileting task (3/3 steps) with assistance level (OT) Description: LTG: Patient will perform toileting task (3/3 steps) with assistance level (OT)  Flowsheets (Taken 09/10/2024 1327) LTG: Pt will perform toileting task (3/3 steps) with assistance level: Independent with assistive device   Problem: RH Light Housekeeping Goal: LTG Patient will perform light housekeeping w/assist (OT) Description: LTG: Patient will perform light housekeeping with assistance, with/without cues (OT). Flowsheets (Taken 09/10/2024 1327) LTG: Pt will perform light housekeeping with assistance level of: Minimal Assistance - Patient > 75%   Problem: RH Toilet Transfers Goal: LTG Patient will perform toilet transfers w/assist (OT) Description: LTG: Patient will perform toilet transfers with assist, with/without cues using equipment (OT) Flowsheets (Taken 09/10/2024 1327) LTG: Pt will perform toilet transfers with assistance level of: Supervision/Verbal cueing   Problem: RH Tub/Shower Transfers Goal: LTG Patient will perform tub/shower transfers w/assist (OT) Description: LTG: Patient will perform tub/shower transfers with assist, with/without cues using equipment (OT) Flowsheets (Taken 09/10/2024 1327) LTG: Pt will perform tub/shower stall transfers with assistance level of: Supervision/Verbal cueing   Problem: RH Memory Goal: LTG Patient will demonstrate ability for day to day recall/carry over during activities of daily living with assistance level (OT) Description: LTG:  Patient will demonstrate ability for day to day recall/carry over during activities of daily living with assistance level (OT). Flowsheets (Taken 09/10/2024 1327) LTG:  Patient will demonstrate ability for day to day recall/carry over during activities of daily living with assistance level (OT): Minimal Assistance - Patient > 75%

## 2024-09-10 NOTE — Evaluation (Signed)
 Occupational Therapy Assessment and Plan  Patient Details  Name: Rhonda Cortez MRN: [id number] Date of Birth: May 02, 1961  OT Diagnosis: apraxia, ataxia, cognitive deficits, and hemiplegia affecting dominant side Rehab Potential: Rehab Potential (ACUTE ONLY): Good ELOS: 14 days   Today's Date: 09/10/2024 OT Individual Time: 9169-9069 OT Individual Time Calculation (min): 60 min     Pt seen for initial evaluation and ADL training with a focus on postural control, balance, R side awareness with ambulation with RW.  Pt did use RW to ambulate to bathroom but required heavy mod A to advance R leg and have pt stay in midline as she leaned to R.  Recommending nursing not walk pt but to do stand pivot transfers.  Pt completed toileting, shower and dressing. Her niece was present and involved in session. She will look in pt's home to see if she has any DME and take photos of bathroom set up.  Pt was having great difficulty motor planning and coordination of RLE with ambulation, but it was interesting as she needed to remove briefs to toilet. Before I could tell pt to sit to doff pants over feet, pt stood and stepped out of pants with good control of legs. Reviewed role of OT, discussed POC and pt's goals, and ELOS. Pt resting in bed With all needs met and bed alarm set.     Hospital Problem: Principal Problem:   Acute ischemic left PCA stroke [facility])   Past Medical History:  Past Medical History:  Diagnosis Date   Hypertension    Stroke [facility])    Past Surgical History:  Past Surgical History:  Procedure Laterality Date   OOPHORECTOMY     TUBAL LIGATION      Assessment & Plan Clinical Impression: .Rhonda Cortez is a 64 year old female with PMHX of hypertension, HLD, and history of CVA in 2014 presented to [facility] on 08/27/2024.  Per chart review the patient was supposed to arrive at a friend's home around 7 PM that night but did not arrive.  When the patient answered her phone around 3  AM she was confused and had difficulty finding words.  In the ED a head CT showed acute intracranial hemorrhage from the left caudate region with extension of the intraventricular region on the left.  Neurology was consulted and the patient was admitted to the ICU.  A repeat CT head showed caudate head hemorrhage with intraventricular extension.  Patient underwent an MRI that confirmed intraventricular hemorrhage, no evidence of underlying mass. 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  Neurology recommended no antithrombotics due to ICH.  Patient was on aspirin  after stroke (2014) for a number of years and then off. VTE prophylaxis SCDs. 2D echo with EF 60 to 65%.  Hospital course complicated by AKI, ongoing headaches, and new fever of unknown origin.    The patient was admitted to CIR on 11/20 and was doing well. She experienced sudden onset of right sided weakness with rigidity, decreased responsiveness, and not following commands. Code stroke was activated and the patient was transferred back to acute care on 11/22 for stroke workup. CT head showed resolving left caudate and left lateral ventricular hemorrhage without new hemorrhage.  CTA head and neck showing high-grade near occlusive stenosis in the proximal right P2 segment and proximal right P3 segment, moderate stenosis in the mid basilar artery, mild narrowing at the distal left A2 segment,  and minimal atherosclerotic calcification at the right carotid bifurcation and minimal atherosclerotic changes in the proximal left ICA, both without significant stenosis.     Per neurology, patient was not a candidate for TNK or EVT due to no LVO noted on imaging and felt symptoms were concerning for seizure in the setting of recent ICH.  She was started on LTM EEG monitoring was given Keppra  1500 mg load,  maintenance Keppra  500 mg twice daily and transition to Vimpat  on 11/24.  EEG suggestive of  cortical dysfunction arising from left temporal region likely secondary to underlying structural abnormality. No seizures or epileptiform discharges were seen. Repeat MRI brain was performed and showed acute infarcts involving the left PCA and adjacent MCA/PCA watershed territories with mild cytotoxic edema.  Neurology recommended aspirin  but no DAPT given ICH history.    Prior to arrival the patient was fully independent working and driving with no use of DME.  She lives alone in a 1 level home with 2 stairs to enter.  Patient currently requires contact-guard assist to min assist for mobility.Therapy evaluations completed due to patient decreased functional mobility was admitted for a comprehensive rehab program. She currently has fever.      Patient transferred to CIR on 09/08/2024 .    Patient currently requires min with basic self-care skills secondary to unbalanced muscle activation and decreased coordination, decreased midline orientation, decreased attention to right, and decreased motor planning, decreased attention, decreased awareness, decreased problem solving, decreased safety awareness, decreased memory, and delayed processing, and decreased sitting balance, decreased standing balance, decreased postural control, and decreased balance strategies.  Prior to hospitalization, patient was fully independent, working and driving.   Patient will benefit from skilled intervention to increase independence with basic self-care skills prior to discharge home with care partner.  Anticipate patient will require 24 hour supervision and follow up outpatient.  OT - End of Session Activity Tolerance: Decreased this session Endurance Deficit: Yes OT Assessment Rehab Potential (ACUTE ONLY): Good OT Barriers to Discharge: None OT Patient demonstrates impairments in the following area(s): Balance;Cognition;Endurance;Motor;Perception;Vision OT Basic ADL's Functional Problem(s): Bathing;Dressing;Toileting OT  Advanced ADL's Functional Problem(s): Light Housekeeping OT Transfers Functional Problem(s): Toilet;Tub/Shower OT Additional Impairment(s): None OT Plan OT Intensity: Minimum of 1-2 x/day, 45 to 90 minutes OT Frequency: 5 out of 7 days OT Duration/Estimated Length of Stay: 14 days OT Treatment/Interventions: Balance/vestibular training;Community reintegration;Patient/family education;Self Care/advanced ADL retraining;Therapeutic Exercise;UE/LE Coordination activities;Cognitive remediation/compensation;Discharge planning;DME/adaptive equipment instruction;Functional mobility training;Psychosocial support;Therapeutic Activities;UE/LE Strength taining/ROM;Visual/perceptual remediation/compensation;Neuromuscular re-education OT Self Feeding Anticipated Outcome(s): independent OT Basic Self-Care Anticipated Outcome(s): supervision OT Toileting Anticipated Outcome(s): mod ind OT Bathroom Transfers Anticipated Outcome(s): supervision OT Recommendation Recommendations for Other Services: Neuropsych consult Patient destination: Home Follow Up Recommendations: Outpatient OT Equipment Recommended: To be determined   OT Evaluation Precautions/Restrictions  Precautions Precautions: Fall Restrictions Weight Bearing Restrictions Per Provider Order: No   Pain Pain Assessment Pain Score: 0-No pain Home Living/Prior Functioning Home Living Family/patient expects to be discharged to:: Private residence Living Arrangements: Children Available Help at Discharge: Family, Available 24 hours/day Type of Home: House Home Access: Stairs to enter [employer] of Steps: 4-5 front entrance with rails, 1 step back entrance (usual entrance) no rail Home Layout: One level Bathroom Shower/Tub: Psychologist, counselling, Tub/shower unit  Lives With: Significant other Prior Function Level of Independence: Independent with basic ADLs, Independent with homemaking with ambulation, Independent with gait,  Independent with transfers  Able to Take Stairs?: Yes Driving: Yes Vocation: Full time employment Leisure: Hobbies-yes (Comment) (  gardening) Vision Baseline Vision/History: 1 Wears glasses Ability to See in Adequate Light: 1 Impaired Patient Visual Report: No change from baseline Vision Assessment?: Yes Eye Alignment: Within Functional Limits Ocular Range of Motion: Within Functional Limits Alignment/Gaze Preference: Within Defined Limits Tracking/Visual Pursuits: Decreased smoothness of horizontal tracking;Decreased smoothness of vertical tracking;Requires cues, head turns, or add eye shifts to track Saccades: Within functional limits;Other (comment) (eye fatigue after 3 rotations) Visual Fields: Other (comment) (with confrontation testing, appears to have a visual field cut in B eyes but will need to assess further. Limited by aphasia) Perception  Perception: Impaired Praxis Praxis: Impaired Praxis Impairment Details: Motor planning Praxis-Other Comments: pt has functional praxis with ADL tasks, but having great difficulty with motor planning as she was taking steps with RW Cognition Cognition Overall Cognitive Status: Impaired/Different from baseline Orientation Level: Person;Place;Situation Person: Oriented Place: Disoriented Situation: Oriented Memory: Impaired Memory Impairment: Storage deficit;Decreased recall of new information Awareness: Impaired Awareness Impairment: Intellectual impairment Problem Solving: Impaired Problem Solving Impairment: Verbal basic;Functional basic Initiating: Impaired Safety/Judgment: Impaired Brief Interview for Mental Status (BIMS) Repetition of Three Words (First Attempt): 3 Temporal Orientation: Year: Correct Temporal Orientation: Month: No answer Temporal Orientation: Day: No answer Recall: Sock: No, could not recall Recall: Blue: No, could not recall Recall: Bed: No, could not recall BIMS Summary Score:  6 Sensation Sensation Light Touch: Appears Intact Hot/Cold: Appears Intact Proprioception: Impaired by gross assessment (difficulty with placement of RLE on floor, finding base of support) Stereognosis: Not tested Coordination Gross Motor Movements are Fluid and Coordinated: No Fine Motor Movements are Fluid and Coordinated: Yes Coordination and Movement Description: LLE tremors,  and RLE ataxia when taking steps with RW Motor  Motor Motor: Abnormal postural alignment and control;Motor impersistence  Trunk/Postural Assessment  Cervical Assessment Cervical Assessment: Within Functional Limits Thoracic Assessment Thoracic Assessment: Within Functional Limits Lumbar Assessment Lumbar Assessment:  (posteror tilt) Postural Control Postural Control: Deficits on evaluation Trunk Control: R lean in standing/gait especially as fatigues Protective Responses: R lateral lean with fatigue, delayed responses  Balance Balance Balance Assessed: Yes Static Sitting Balance Static Sitting - Level of Assistance: 5: Stand by assistance Dynamic Sitting Balance Dynamic Sitting - Level of Assistance: 4: Min assist;5: Stand by assistance Static Standing Balance Static Standing - Level of Assistance: 4: Min assist Dynamic Standing Balance Dynamic Standing - Level of Assistance: 4: Min assist;3: Mod assist Extremity/Trunk Assessment RUE Assessment RUE Assessment: Within Functional Limits LUE Assessment LUE Assessment: Within Functional Limits  Care Tool Care Tool Self Care Eating   Eating Assist Level: Set up assist    Oral Care    Oral Care Assist Level: Supervision/Verbal cueing    Bathing   Body parts bathed by patient: Right arm;Left arm;Chest;Abdomen;Front perineal area;Buttocks;Right upper leg;Left upper leg;Right lower leg;Left lower leg;Face     Assist Level: Contact Guard/Touching assist    Upper Body Dressing(including orthotics)   What is the patient wearing?: Pull over  shirt   Assist Level: Supervision/Verbal cueing    Lower Body Dressing (excluding footwear)   What is the patient wearing?: Underwear/pull up;Pants Assist for lower body dressing: Minimal Assistance - Patient > 75%    Putting on/Taking off footwear   What is the patient wearing?: Non-skid slipper socks Assist for footwear: Supervision/Verbal cueing       Care Tool Toileting Toileting activity   Assist for toileting: Minimal Assistance - Patient > 75%     Care Tool Bed Mobility Roll left and right activity   Roll  left and right assist level: Supervision/Verbal cueing    Sit to lying activity   Sit to lying assist level: Supervision/Verbal cueing    Lying to sitting on side of bed activity   Lying to sitting on side of bed assist level: the ability to move from lying on the back to sitting on the side of the bed with no back support.: Contact Guard/Touching assist     Care Tool Transfers Sit to stand transfer   Sit to stand assist level: Minimal Assistance - Patient > 75%    Chair/bed transfer   Chair/bed transfer assist level: Minimal Assistance - Patient > 75%     Toilet transfer   Assist Level: Minimal Assistance - Patient > 75%     Care Tool Cognition  Expression of Ideas and Wants Expression of Ideas and Wants: 2. Frequent difficulty - frequently exhibits difficulty with expressing needs and ideas  Understanding Verbal and Non-Verbal Content Understanding Verbal and Non-Verbal Content: 3. Usually understands - understands most conversations, but misses some part/intent of message. Requires cues at times to understand   Memory/Recall Ability Memory/Recall Ability : None of the above were recalled   Refer to Care Plan for Long Term Goals  SHORT TERM GOAL WEEK 1 OT Short Term Goal 1 (Week 1): pt will be able to ambulate to bathroom with RW with min A. OT Short Term Goal 2 (Week 1): Pt will be able to don all LB clothing with supervision. OT Short Term Goal 3 (Week  1): Pt will be able to hold balance safely on toilet and toilet with Supervision. OT Short Term Goal 4 (Week 1): Pt will be able to stand at sink for grooming tasks with CGA.  Recommendations for other services: Neuropsych   Skilled Therapeutic Intervention ADL ADL Eating: Set up Grooming: Supervision/safety Upper Body Bathing: Supervision/safety;Minimal cueing Where Assessed-Upper Body Bathing: Shower Lower Body Bathing: Supervision/safety;Minimal cueing Where Assessed-Lower Body Bathing: Shower Upper Body Dressing: Supervision/safety Where Assessed-Upper Body Dressing: Chair Lower Body Dressing: Minimal assistance Where Assessed-Lower Body Dressing: Chair Toileting: Minimal assistance Where Assessed-Toileting: Teacher, Adult Education: Moderate assistance;Moderate verbal cueing Toilet Transfer Method: Proofreader: Acupuncturist: Moderate cueing;Moderate assistance Film/video Editor Method: Designer, Industrial/product: Transfer tub bench;Grab bars Mobility  Bed Mobility Bed Mobility: Rolling Left;Supine to Sit;Sit to Supine Rolling Right: Supervision/verbal cueing Supine to Sit: Contact Guard/Touching assist Sit to Supine: Supervision/Verbal cueing Transfers Sit to Stand: Minimal Assistance - Patient > 75% Stand to Sit: Minimal Assistance - Patient > 75%   Discharge Criteria: Patient will be discharged from OT if patient refuses treatment 3 consecutive times without medical reason, if treatment goals not met, if there is a change in medical status, if patient makes no progress towards goals or if patient is discharged from hospital.  The above assessment, treatment plan, treatment alternatives and goals were discussed and mutually agreed upon: by patient and by family  Jaymon Dudek 09/10/2024, 1:20 PM

## 2024-09-11 LAB — CBC
HCT: 39.2 % (ref 36.0–46.0)
Hemoglobin: 13.4 g/dL (ref 12.0–15.0)
MCH: 29.4 pg (ref 26.0–34.0)
MCHC: 34.2 g/dL (ref 30.0–36.0)
MCV: 86 fL (ref 80.0–100.0)
Platelets: 407 K/uL — ABNORMAL HIGH (ref 150–400)
RBC: 4.56 MIL/uL (ref 3.87–5.11)
RDW: 11.9 % (ref 11.5–15.5)
WBC: 9.5 K/uL (ref 4.0–10.5)
nRBC: 0 % (ref 0.0–0.2)

## 2024-09-11 LAB — BASIC METABOLIC PANEL WITH GFR
Anion gap: 11 (ref 5–15)
BUN: 23 mg/dL (ref 8–23)
CO2: 20 mmol/L — ABNORMAL LOW (ref 22–32)
Calcium: 9.1 mg/dL (ref 8.9–10.3)
Chloride: 104 mmol/L (ref 98–111)
Creatinine, Ser: 1.93 mg/dL — ABNORMAL HIGH (ref 0.44–1.00)
GFR, Estimated: 29 mL/min — ABNORMAL LOW (ref >60)
Glucose, Bld: 110 mg/dL — ABNORMAL HIGH (ref 70–99)
Potassium: 4 mmol/L (ref 3.5–5.1)
Sodium: 135 mmol/L (ref 135–145)

## 2024-09-11 LAB — CULTURE, OB URINE: Culture: >=100000 — AB

## 2024-09-11 NOTE — Progress Notes (Signed)
 Occupational Therapy Session Note  Patient Details  Name: Rhonda Cortez MRN: [id number] Date of Birth: 03-Dec-1960  Today's Date: 09/11/2024 OT Individual Time: 9199-9084 OT Individual Time Calculation (min): 75 min    Short Term Goals: Week 1:  OT Short Term Goal 1 (Week 1): pt will be able to ambulate to bathroom with RW with min A. OT Short Term Goal 2 (Week 1): Pt will be able to don all LB clothing with supervision. OT Short Term Goal 3 (Week 1): Pt will be able to hold balance safely on toilet and toilet with Supervision. OT Short Term Goal 4 (Week 1): Pt will be able to stand at sink for grooming tasks with CGA.  Skilled Therapeutic Interventions/Progress Updates:    Initial Encounter:  Patient resting in bed with family present at the time of treatment, pt indicated that she rested during the night and that she had no pain to report.  The pt presented with resting values for BP of 127/75, pulse of 88. The pt was in agreement with complete BADL in shower.  BADL Performance: The patient was able to ambulate to the restroom using the RW with CGA. The pt was able to go to the commode and doff her LB garments, a brief and pants with MinA. The pt was close S incorporating the grab bars for placement on the commode. The pt was able to void and complete toileting hygiene with closeS using the grab bars and the RW. The pt was able to donn her LB over her hips and bottom with MinA, she was CGA for ambulating to the shower chair. The pt was able to doff her over head shirt with MinA, she was minA for doffing her brief, pants and non-skid socks as well. The pt was able to bathe  her UB/LB using the grab bars with closeS and minimal vc's for safe task performance relating to  placement on the shower chair. The pt  was able to dry herself off and apply lotion and deo with s/uA. The pt was able to donn her over head shirt with s/uA, she was Mod to MinA with donning her  brief and pants . The pt was s/uA  with her non-skid socks.  The pt was able to ambulate to the sink using her RW with CGA. The pt was able to go from standing to sit for placement in the w/c at the sink. The pt was able brush her teeth with s/uA. At the end of the session, the pt remained at w/c LOF with the call light and bed side table within reach and her family present doing her hair at w/c LOF.  Therapy Documentation Precautions:  Precautions Precautions: Fall Restrictions Weight Bearing Restrictions Per Provider Order: No   Therapy/Group: Individual Therapy  Elvera JONETTA Mace 09/11/2024, 12:52 PM

## 2024-09-11 NOTE — IPOC Note (Signed)
 Overall Plan of Care [facility]) Patient Details Name: Rhonda Cortez MRN: [id number] DOB: [birth year]  Admitting Diagnosis: Acute ischemic left PCA stroke [facility])  Hospital Problems: Principal Problem:   Acute ischemic left PCA stroke [facility])     Functional Problem List: Nursing Bowel, Endurance, Medication Management, Safety  PT Balance, Endurance, Motor, Pain, Perception, Safety, Skin Integrity  OT Balance, Cognition, Endurance, Motor, Perception, Vision  SLP Cognition, Linguistic  TR         Basic ADL's: OT Bathing, Dressing, Toileting     Advanced  ADL's: OT Light Housekeeping     Transfers: PT Bed Mobility, Bed to Chair, Furniture, Customer Service Manager, Research Scientist (life Sciences): PT Ambulation, Psychologist, [employer], Stairs     Additional Impairments: OT None  SLP Social Cognition, Communication comprehension, expression Problem Solving, Attention, Memory, Awareness  TR      Anticipated Outcomes Item Anticipated Outcome  Self Feeding independent  Swallowing      Basic self-care  supervision  Toileting  mod ind   Bathroom Transfers supervision  Bowel/Bladder  manage bowel w mod I assist  Transfers  supervision overall  Locomotion  supervision ambulatory and stiars  Communication  min  Cognition  min  Pain  n/a  Safety/Judgment  manage safety w cues   Therapy Plan: PT Intensity: Minimum of 1-2 x/day ,45 to 90 minutes PT Frequency: 5 out of 7 days PT Duration Estimated Length of Stay: 10-14 days OT Intensity: Minimum of 1-2 x/day, 45 to 90 minutes OT Frequency: 5 out of 7 days OT Duration/Estimated Length of Stay: 14 days SLP Intensity: Minumum of 1-2 x/day, 30 to 90 minutes SLP Frequency: 3 to 5 out of 7 days SLP Duration/Estimated Length of Stay: 12-14 days   Team Interventions: Nursing Interventions Patient/Family Education, Medication Management, Bowel Management, Disease Management/Prevention, Discharge Planning  PT interventions Ambulation/gait  training, Balance/vestibular training, Cognitive remediation/compensation, Community reintegration, Discharge planning, Disease management/prevention, DME/adaptive equipment instruction, Functional mobility training, Neuromuscular re-education, Functional electrical stimulation, Pain management, Patient/family education, Psychosocial support, Skin care/wound management, Splinting/orthotics, Stair training, Therapeutic Activities, Therapeutic Exercise, UE/LE Strength taining/ROM, UE/LE Coordination activities, Visual/perceptual remediation/compensation, Wheelchair propulsion/positioning  OT Interventions Warden/ranger, Firefighter, Equities Trader education, Self Care/advanced ADL retraining, Therapeutic Exercise, UE/LE Coordination activities, Cognitive remediation/compensation, Discharge planning, DME/adaptive equipment instruction, Functional mobility training, Psychosocial support, Therapeutic Activities, UE/LE Strength taining/ROM, Visual/perceptual remediation/compensation, Neuromuscular re-education  SLP Interventions Cognitive remediation/compensation, Internal/external aids, Speech/Language facilitation, Therapeutic Activities, Cueing hierarchy, Functional tasks, Patient/family education, Multimodal communication approach  TR Interventions    SW/CM Interventions Discharge Planning, Psychosocial Support, Patient/Family Education   Barriers to Discharge MD  Medical stability  Nursing Lack of/limited family support 1 level, 2 ste no rail solo; sons to come stay with her;Independent in ADL, IADL, working, driving PTA  PT None    OT None    SLP      SW       Team Discharge Planning: Destination: PT-Home ,OT- Home , SLP-Home Projected Follow-up: PT-Outpatient PT, 24 hour supervision/assistance, OT-  Outpatient OT, SLP-Outpatient SLP Projected Equipment Needs: PT-To be determined, OT- To be determined, SLP-None recommended by SLP Equipment Details: PT- , OT-   Patient/family involved in discharge planning: PT- Patient, Family member/caregiver,  OT-Patient, Family member/caregiver, SLP-Patient  MD ELOS: 10-14 days Medical Rehab Prognosis:  Excellent Assessment: The patient has been admitted for CIR therapies with the diagnosis of left PCA infarct. The team will be addressing functional mobility, strength, stamina, balance, safety, adaptive techniques and equipment, self-care,  bowel and bladder mgt, patient and caregiver education, NMR, cognition, swallowing, communication. Goals have been set at supervision to mod I with mobility and self-care and min assist with cognition/communication. Anticipated discharge destination is with family.        See Team Conference Notes for weekly updates to the plan of care

## 2024-09-11 NOTE — Progress Notes (Signed)
 Physical Therapy Session Note  Patient Details  Name: Rhonda Cortez MRN: [id number] Date of Birth: 22-Dec-1960  Today's Date: 09/11/2024 PT Individual Time: 1020-1115; 1310 - 1345 PT Individual Time Calculation (min): 55 min   Short Term Goals: Week 1:  PT Short Term Goal 1 (Week 1): Pt will perform transfers with CGA PT Short Term Goal 2 (Week 1): Pt will demonstrate dynamic standing balance with CGA PT Short Term Goal 3 (Week 1): Pt will perform gait x 150' with CGA PT Short Term Goal 4 (Week 1): Pt will perform stairs with CGA  SESSION 1 Skilled Therapeutic Interventions/Progress Updates: Patient sitting in [facility] with family present on entrance to room. Patient alert and agreeable to PT session.   Patient reported no pain. Beginning of session focused on building pt rapport, what to expect working with the rehab team, and benefits of inpatient rehab (pt reported wanting to go home in the next few days and PTA communicated that d/c date will be discussed with rehab staff on Wednesday).   Therapeutic Activity: Transfers: Pt performed sit<>stand transfers throughout session with CGA/minA and cues to increase step clearance on R LE.   Pt ambulated roughly 160' in RW with WC follow for safety and overall CGA/minA for safety and presented with increase in R weight shift vs L. Pt required 2 standing rest breaks towards end and noted to have increase in L knee slightly buckling due to fatigue. Pt also required increased time to process how to navigate obstacles in RW and to pivot in RW to sit. PTA discussed this with pt following and what some things can be worked on from that walk alone.   TUG (avg = 45 86s) in RW with CGA/minA and decreased clearance on R LE (especially when turning 180*). Pt educated following on hand placement when standing/sitting.   - 50.56s  - 45.30s  - 41.72s  Patient sitting in WC at end of session with brakes locked, family present, chair alarm set, and all needs within  reach.  SESSION 2 Skilled Therapeutic Interventions/Progress Updates: Patient sitting in [facility] following toileting with nsg on entrance to room. Patient alert and agreeable to PT session.   Patient reported no pain, only fatigue  Therapeutic Activity: Bed Mobility: Pt performed sit<supine from EOB with supervision.  Transfers: Pt performed sit<>stand transfers throughout session with CGA/minA and VC for hand placement and to increase R step clearance and weight shift to L.  PTA provided pt with lower WC for pt to have easier ability to scoot back to in [facility].  Neuromuscular Re-ed: NMR facilitated during session with focus on coordination, dynamic standing balance, weight shift, motor planning. - Step to 6step with 5lb ankle weight donned R LE and L HHA. Pt required minA and frequent multimodal cuing to increase weight shift to L. 1 round close to fatigue. On 2nd round pt without weight donned and emphasis on weight shifting to L. Pt required increased time to motor plan advancement of R LE onto step (PTA provided demonstration again). Pt modA this round to prevent R LOB.   NMR performed for improvements in motor control and coordination, balance, sequencing, judgement, and self confidence/ efficacy in performing all aspects of mobility at highest level of independence.   Patient supine at end of session with brakes locked, bed alarm set, and all needs within reach.       Therapy Documentation Precautions:  Precautions Precautions: Fall Restrictions Weight Bearing Restrictions Per Provider Order: No Therapy/Group: Individual Therapy  Esparanza Krider PTA 09/11/2024, 12:07 PM

## 2024-09-11 NOTE — Progress Notes (Signed)
 PROGRESS NOTE   Subjective/Complaints:  Pt with breakfast tray in front of her. Didn't each much yesterday but ate 50% breakfast today.   ROS: Patient denies fever, rash, sore throat, blurred vision, dizziness, nausea, vomiting, diarrhea, cough, shortness of breath or chest pain, joint or back/neck pain, headache, or mood change.   Objective:   VAS US  LOWER EXTREMITY VENOUS (DVT) Result Date: 09/10/2024  Lower Venous DVT Study Patient Name:  Rhonda Cortez  Date of Exam:   09/09/2024 Medical Rec #: [id number]    Accession #:    [id number] Date of Birth: November 02, 1960    Patient Gender: F Patient Age:   63 years Exam Location:  [place] Procedure:      VAS US  LOWER EXTREMITY VENOUS (DVT) Referring Phys: DAPHNE SATTERFIELD --------------------------------------------------------------------------------  Indications: Fever. Other Indications: Rehab patient. Comparison Study: No previous exams Performing Technologist: Jody Hill RVT, RDMS  Examination Guidelines: A complete evaluation includes B-mode imaging, spectral Doppler, color Doppler, and power Doppler as needed of all accessible portions of each vessel. Bilateral testing is considered an integral part of a complete examination. Limited examinations for reoccurring indications may be performed as noted. The reflux portion of the exam is performed with the patient in reverse Trendelenburg.  +---------+---------------+---------+-----------+----------+--------------+ RIGHT    CompressibilityPhasicitySpontaneityPropertiesThrombus Aging +---------+---------------+---------+-----------+----------+--------------+ CFV      Full           Yes      Yes                                 +---------+---------------+---------+-----------+----------+--------------+ SFJ      Full                                                         +---------+---------------+---------+-----------+----------+--------------+ FV Prox  Full           Yes      Yes                                 +---------+---------------+---------+-----------+----------+--------------+ FV Mid   Full           Yes      Yes                                 +---------+---------------+---------+-----------+----------+--------------+ FV DistalFull           Yes      Yes                                 +---------+---------------+---------+-----------+----------+--------------+ PFV      Full                                                        +---------+---------------+---------+-----------+----------+--------------+  POP      Full           Yes      Yes                                 +---------+---------------+---------+-----------+----------+--------------+ PTV      Full                                                        +---------+---------------+---------+-----------+----------+--------------+ PERO     Full                                                        +---------+---------------+---------+-----------+----------+--------------+   +---------+---------------+---------+-----------+----------+--------------+ LEFT     CompressibilityPhasicitySpontaneityPropertiesThrombus Aging +---------+---------------+---------+-----------+----------+--------------+ CFV      Full           Yes      Yes                                 +---------+---------------+---------+-----------+----------+--------------+ SFJ      Full                                                        +---------+---------------+---------+-----------+----------+--------------+ FV Prox  Full           Yes      Yes                                 +---------+---------------+---------+-----------+----------+--------------+ FV Mid   Full           Yes      Yes                                  +---------+---------------+---------+-----------+----------+--------------+ FV DistalFull           Yes                                          +---------+---------------+---------+-----------+----------+--------------+ PFV      Full                                                        +---------+---------------+---------+-----------+----------+--------------+ POP      Full           Yes      Yes                                 +---------+---------------+---------+-----------+----------+--------------+ PTV  Full                                                        +---------+---------------+---------+-----------+----------+--------------+ PERO     Full                                                        +---------+---------------+---------+-----------+----------+--------------+     Summary: BILATERAL: - No evidence of deep vein thrombosis seen in the lower extremities, bilaterally. -No evidence of popliteal cyst, bilaterally.   *See table(s) above for measurements and observations. Electronically signed by Norman Serve on 09/10/2024 at 7:14:51 AM.    Final    Recent Labs    09/10/24 0608 09/11/24 0506  WBC 12.7* 9.5  HGB 12.0 13.4  HCT 35.0* 39.2  PLT 372 407*   Recent Labs    09/10/24 0608 09/11/24 0506  NA 136 135  K 4.1 4.0  CL 107 104  CO2 18* 20*  GLUCOSE 107* 110*  BUN 23 23  CREATININE 1.72* 1.93*  CALCIUM  8.6* 9.1    Intake/Output Summary (Last 24 hours) at 09/11/2024 9077 Last data filed at 09/11/2024 0842 Gross per 24 hour  Intake 364 ml  Output --  Net 364 ml        Physical Exam: Vital Signs Blood pressure 112/68, pulse 85, temperature 98.8 F (37.1 C), temperature source Oral, resp. rate 18, height 5' 3 (1.6 m), weight 56.6 kg, SpO2 100%.   Constitutional: No distress . Vital signs reviewed. HEENT: NCAT, EOMI, oral membranes moist Neck: supple Cardiovascular: RRR without murmur. No JVD    Respiratory/Chest: CTA  Bilaterally without wheezes or rales. Normal effort    GI/Abdomen: BS +, non-tender, non-distended Ext: no clubbing, cyanosis, or edema Psych: pleasant and cooperative  Neuro: Pt alert. Word finding and processing delays. Ongoing Right sided facial droop. Speech sl dysarthric. Mild right HP with decreased FMC . Sl decrease in right sided sensation of light touch. Musc: Full ROM, No pain with AROM or PROM in the neck, trunk, or extremities. Posture appropriate    Assessment/Plan: 1. Functional deficits which require 3+ hours per day of interdisciplinary therapy in a comprehensive inpatient rehab setting. Physiatrist is providing close team supervision and 24 hour management of active medical problems listed below. Physiatrist and rehab team continue to assess barriers to discharge/monitor patient progress toward functional and medical goals  Care Tool:  Bathing    Body parts bathed by patient: Right arm, Left arm, Chest, Abdomen, Front perineal area, Buttocks, Right upper leg, Left upper leg, Right lower leg, Left lower leg, Face         Bathing assist Assist Level: Contact Guard/Touching assist     Upper Body Dressing/Undressing Upper body dressing   What is the patient wearing?: Pull over shirt    Upper body assist Assist Level: Supervision/Verbal cueing    Lower Body Dressing/Undressing Lower body dressing      What is the patient wearing?: Underwear/pull up, Pants     Lower body assist Assist for lower body dressing: Minimal Assistance - Patient > 75%     Toileting Toileting    Toileting assist Assist for  toileting: Minimal Assistance - Patient > 75%     Transfers Chair/bed transfer  Transfers assist     Chair/bed transfer assist level: Minimal Assistance - Patient > 75%     Locomotion Ambulation   Ambulation assist      Assist level: Moderate Assistance - Patient 50 - 74% Assistive device: No Device Max distance: 50'   Walk 10 feet  activity   Assist     Assist level: Moderate Assistance - Patient - 50 - 74% Assistive device: No Device   Walk 50 feet activity   Assist    Assist level: Moderate Assistance - Patient - 50 - 74% Assistive device: No Device    Walk 150 feet activity   Assist Walk 150 feet activity did not occur: Safety/medical concerns (endurnace)         Walk 10 feet on uneven surface  activity   Assist     Assist level: Minimal Assistance - Patient > 75% Assistive device: Walker-rolling   Wheelchair     Assist Is the patient using a wheelchair?: Yes Type of Wheelchair: Manual    Wheelchair assist level: Dependent - Patient 0%      Wheelchair 50 feet with 2 turns activity    Assist        Assist Level: Dependent - Patient 0%   Wheelchair 150 feet activity     Assist      Assist Level: Dependent - Patient 0%   Blood pressure 112/68, pulse 85, temperature 98.8 F (37.1 C), temperature source Oral, resp. rate 18, height 5' 3 (1.6 m), weight 56.6 kg, SpO2 100%.   Medical Problem List and Plan: 1. 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  Antithrombotics: -DVT/anticoagulation:  Mechanical: Sequential compression devices, below knee Bilateral lower extremities  -dopplers reviewed and negative Pharmaceutical: Heparin              -antiplatelet therapy: continue Aspirin  81 mg --No DAPT due to recent ICH   3. Pain Management:con't tylenol  as needed   4. Mood/Behavior/Sleep: LCSW to follow for evaluation and support when available.              -antipsychotic agents: N/A              -sleep patterns better   5. Neuropsych/cognition: This patient is not capable of making decisions on her own behalf.   6. Skin/Wound Care: Routine pressure relief measures.    7. AKI on  CKD: IVF started at 48mls/hr.    11/29 labs fairly stable. May be close to her renal baseline  -continue to push po. She does need to eat better  -daughter trying to push po with her also  9. Hx recent ICH: continue Lipitor 40 mg daily    10. Hx stroke/TIA: Stroke 2014-no significant residual deficit. Continue statin.    11. Seizure: Episode on 11/22, EEG negative for seizure activity. Loaded with Keppra , now on Vimpat  50 mg bid.    12. 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 Avoid low BP-Longterm BP goal 130-150 systolic given severe stenosis/occlusion of left MI and P1.              -  orthostatics still being done consistently. Continue to monitor    13.Hx of HTN: continue home regime- Norvac and Valsartan .  Same as above.    14. HLD: was on Pitavastatin  at home, continue Atorvastatin  40 mg daily.    15.  Fever/leukocytosis: Intermittent fevers   -Repeat Chest xray/urinalysis/Blood cultures and venous doppler and CT abd/pelvis d/t recent ICH.     11/27- CT- went over with pt/family- looks OK except constipation and subcentimeter hypodensity in liver  11/28-29 WBC's ?trending down --9.5 today   -UA +, empiric keflex  on board   -UCX with diptheroids-   -Blood cx NGTD.    -Tmx 100.1 yesterday, HR in 90's this morning.     -continue keflex  for now   -pt looks comfortable, no outward signs of infection     16. Tachycardia Toprol  XL started HS   11/27- will monitor for OH since been having issues- HR down to 99 this AM  -11/28 may be due to above 18. Constipation  11/27- LBM after sorbitol         LOS: 3 days A FACE TO FACE EVALUATION WAS PERFORMED  Rhonda Cortez 09/11/2024, 9:22 AM

## 2024-09-11 NOTE — Progress Notes (Signed)
 This morning provided education to family member and patient, with OT assistance, about swallowing medication. Tips on how to swallow medication and chin tuck maneuver. Does demonstrate inattention this morning. Difficulty staying on task or completing multiple task at once. Per family member patient demonstrate impulsivity and forgetfulness at night talked with bathroom.

## 2024-09-11 NOTE — Plan of Care (Signed)
  Problem: Consults Goal: RH STROKE PATIENT EDUCATION Description: See Patient Education module for education specifics  Outcome: Progressing   Problem: RH BOWEL ELIMINATION Goal: RH STG MANAGE BOWEL WITH ASSISTANCE Description: STG Manage Bowel with mod I  Assistance. Outcome: Progressing Goal: RH STG MANAGE BOWEL W/MEDICATION W/ASSISTANCE Description: STG Manage Bowel with Medication with mod I  Assistance. Outcome: Progressing   Problem: RH SAFETY Goal: RH STG ADHERE TO SAFETY PRECAUTIONS W/ASSISTANCE/DEVICE Description: STG Adhere to Safety Precautions With cues Assistance/Device. Outcome: Progressing

## 2024-09-12 MED ORDER — SENNOSIDES-DOCUSATE SODIUM 8.6-50 MG PO TABS
1.0000 | ORAL_TABLET | Freq: Two times a day (BID) | ORAL | Status: DC
  Administered 2024-09-12 – 2024-09-22 (×21): 1 via ORAL
  Filled 2024-09-12 (×21): qty 1

## 2024-09-12 NOTE — Progress Notes (Signed)
 Physical Therapy Session Note  Patient Details  Name: Rhonda Cortez MRN: [id number] Date of Birth: 15-May-1961  Today's Date: 09/12/2024 PT Individual Time: 1000-1111 PT Individual Time Calculation (min): 71 min   Short Term Goals: Week 1:  PT Short Term Goal 1 (Week 1): Pt will perform transfers with CGA PT Short Term Goal 2 (Week 1): Pt will demonstrate dynamic standing balance with CGA PT Short Term Goal 3 (Week 1): Pt will perform gait x 150' with CGA PT Short Term Goal 4 (Week 1): Pt will perform stairs with CGA  Skilled Therapeutic Interventions/Progress Updates:      Pt lying in bed to start - awake and needs encouragement to participate in her therapy treatment - appears apathetic towards therapy. Played Christmas Music during session to help boost mood.   Bed mobility completed at supervision level, slow to move. Sit<>stand to RW with supervision assist from low bed height. She ambulated at supervision level 150' with RW from her room to the day room gym - cues for forward gaze and increasing her gait speed.   Active warm up of 2x10 sit<>stands with seated rest breaks b/w sets, no UE to encourage BLE strengthening and balance training.   Pt instructed in unsupported (no AD or UE support) gait training 165' + 165' + 165' (seated rest breaks b/w bouts) with CGA. Pt demonstrating decreased step length on R>L with mild R trunk lean. Increased unsteadiness with turns and with distractions, worked on correcting gait deficits and equal step lengths bilaterally.   Added dynamic gait tasks to further challenge balance and stability while ambulating: -figure-8 walking 3x8 -backward walking 1x20' -tandem walking 2x25' -side stepping 1x20' -picking up cones from ground x8 *minA needed for dynamic gait, increased LOB to the R with delayed stepping strategies  Finished session on nustep x 10 minutes at L4 resistance using BUE/BLE - encouraged cadence > 25spm. Total 240 steps.   Pt  returned to her room, needing CGA while using the RW due to fatigue. Also required directional cues to locate her room due to memory deficits. Ended session in bed with her needs met, alarm on.   Therapy Documentation Precautions:  Precautions Precautions: Fall Restrictions Weight Bearing Restrictions Per Provider Order: No General:    Balance:     Therapy/Group: Individual Therapy  Sherlean SHAUNNA Perks 09/12/2024, 7:36 AM

## 2024-09-12 NOTE — Progress Notes (Signed)
 Occupational Therapy Session Note  Patient Details  Name: Rhonda Cortez MRN: [id number] Date of Birth: 05/22/1961  Today's Date: 09/12/2024 OT Individual Time: 9199-9082 OT Individual Time Calculation (min): 77 min    Short Term Goals: Week 1:  OT Short Term Goal 1 (Week 1): pt will be able to ambulate to bathroom with RW with min A. OT Short Term Goal 2 (Week 1): Pt will be able to don all LB clothing with supervision. OT Short Term Goal 3 (Week 1): Pt will be able to hold balance safely on toilet and toilet with Supervision. OT Short Term Goal 4 (Week 1): Pt will be able to stand at sink for grooming tasks with CGA.  Skilled Therapeutic Interventions/Progress Updates:      Therapy Documentation Precautions:  Precautions Precautions: Fall Restrictions Weight Bearing Restrictions Per Provider Order: No General: Pt supine in bed upon OT arrival with nsg present receiving medications, agreeable to OT session.  Pain: no pain reported  ADL: OT providing skilled intervention on ADL retraining in order to increase independence with tasks and increase activity tolerance. Pt completed the following tasks at the current level of assist: Bed mobility: SBA supine><EOB with good trunk control  Grooming/oral hygiene: standing at sink, SBA overall, OT noting gradual slight lean to Rt with static standing requiring cueing to correct Transfers: SBA overall with transfers and mobility. OT providing skilled intervention with functional mobility and safety, pt able to ambulate to day room with RW and SBA with OT emphasizing safe sit to stand techniques when completing. Pt able to ambulate back to room with OT challenging pt to remember how to get back to room with 100% accuracy.   Other Treatments: OT providing therapeutic use of self in order to build rapport and discuss patient current situation and goals for therapy. OT providing skilled cognitive interventions with date recognition, sequencing,  orientation to time/place, etc. OT issuing calendars and educating pt on dates of medical events and assisting pt with writing on calendar d/t pt confusion with sequence of events. OT also challenging pt to discuss where children are living, what they do for work, etc with Mod A for assistance with recall as well as repetition of words. Pt also requiring assistance with spelling of simple words and OT noting apraxic behaviors such as writing random letters with pt unable to write letter she is thinking and requiring repetitive cues for correct letter.    Pt supine in bed with bed alarm activated, 2 bed rails up, call light within reach and 4Ps assessed.   Therapy/Group: Individual Therapy  Camie Hoe, OTD, OTR/L 09/12/2024, 12:49 PM

## 2024-09-12 NOTE — Plan of Care (Signed)
  Problem: Consults Goal: RH STROKE PATIENT EDUCATION Description: See Patient Education module for education specifics  Outcome: Progressing   Problem: RH BOWEL ELIMINATION Goal: RH STG MANAGE BOWEL WITH ASSISTANCE Description: STG Manage Bowel with mod I  Assistance. Outcome: Progressing Goal: RH STG MANAGE BOWEL W/MEDICATION W/ASSISTANCE Description: STG Manage Bowel with Medication with mod I Assistance. Outcome: Progressing   Problem: RH SAFETY Goal: RH STG ADHERE TO SAFETY PRECAUTIONS W/ASSISTANCE/DEVICE Description: STG Adhere to Safety Precautions With cues Assistance/Device. Outcome: Progressing   Problem: RH KNOWLEDGE DEFICIT Goal: RH STG INCREASE KNOWLEDGE OF HYPERTENSION Description: Patient and sister will be able to manage HTN using educational resources for medications an dietary modification independently Outcome: Progressing Goal: RH STG INCREASE KNOWLEGDE OF HYPERLIPIDEMIA Description: Patient and sister will be able to manage HLD using educational resources for medications an dietary modification independently Outcome: Progressing Goal: RH STG INCREASE KNOWLEDGE OF STROKE PROPHYLAXIS Description: Patient and sister will be able to manage secondary risks using educational resources for medications an dietary modification independently Outcome: Progressing

## 2024-09-12 NOTE — Progress Notes (Signed)
 Speech Language Pathology Daily Session Note  Patient Details  Name: Rhonda Cortez MRN: [id number] Date of Birth: 1961-04-22  Today's Date: 09/12/2024 SLP Individual Time: 1300-1345 SLP Individual Time Calculation (min): 45 min  Short Term Goals: Week 1: SLP Short Term Goal 1 (Week 1): Patient will demonstrate sustained attention to functional tasks with mod multimodal A SLP Short Term Goal 2 (Week 1): Patient will verbally communicate wants/needs given mod multimodal A SLP Short Term Goal 3 (Week 1): Patient will answer complex yes/no questions with 90% accuracy given mod multimodal A SLP Short Term Goal 4 (Week 1): Patient will name functional items with 60% accuracy given mod multimodal A  Skilled Therapeutic Interventions:   Pt greeted at bedside. She was awake upon SLP arrival and very pleasant/cooperative throughout tx tasks targeting communication. SLP facilitated automatic speech tasks to prime for additional naming tasks. She was able to complete phrases (opposites) w/ only supervisionA. She also unscrambled automatic sequences; requiring minA for 1-10, modA for dow, and maxA for months. After unscrambling them, she verbalized the sequences independently. She then completed a functional responsive naming task w/ maxA overall. She was able to name 6/20 items spontaneously, but required sentence completion cues for remaining items. An appropriate instance of frustration/concern was noted, as she asked am I ever getting that back? In re to expressive language skills. She demonstrated a positive response to aphasia education from SLP and reported no additional questions at this time. Will require additional education in upcoming tx sessions given comprehension deficits. She was left in bed w/ the alarm set and call light within reach. Telesitter was being placed upon SLP departure as well. Recommend cont ST per POC.   Pain  None reported   Therapy/Group: Individual Therapy  Recardo DELENA Mole 09/12/2024, 3:48 PM

## 2024-09-12 NOTE — Progress Notes (Signed)
 PROGRESS NOTE   Subjective/Complaints:  Pt rested fairly well. Ate a little better yesterday.   ROS: Limited due to cognitive/behavioral   Objective:   No results found.  Recent Labs    09/10/24 0608 09/11/24 0506  WBC 12.7* 9.5  HGB 12.0 13.4  HCT 35.0* 39.2  PLT 372 407*   Recent Labs    09/10/24 0608 09/11/24 0506  NA 136 135  K 4.1 4.0  CL 107 104  CO2 18* 20*  GLUCOSE 107* 110*  BUN 23 23  CREATININE 1.72* 1.93*  CALCIUM  8.6* 9.1    Intake/Output Summary (Last 24 hours) at 09/12/2024 9176 Last data filed at 09/12/2024 0800 Gross per 24 hour  Intake 480 ml  Output --  Net 480 ml        Physical Exam: Vital Signs Blood pressure 116/75, pulse 85, temperature 99 F (37.2 C), temperature source Oral, resp. rate 18, height 5' 3 (1.6 m), weight 61.8 kg, SpO2 100%.   Constitutional: No distress . Vital signs reviewed. HEENT: NCAT, EOMI, oral membranes moist Neck: supple Cardiovascular: RRR without murmur. No JVD    Respiratory/Chest: CTA Bilaterally without wheezes or rales. Normal effort    GI/Abdomen: BS +, non-tender, non-distended Ext: no clubbing, cyanosis, or edema Psych: flat but pleasant and cooperative  Neuro: Pt alert. Word finding and processing delays. Persistent right sided facial droop. Speech sl dysarthric. Mild right HP with decreased FMC . Sl decrease in right sided sensation of light touch. Musc: Full ROM, No pain with AROM or PROM in the neck, trunk, or extremities. Posture appropriate    Assessment/Plan: 1. Functional deficits which require 3+ hours per day of interdisciplinary therapy in a comprehensive inpatient rehab setting. Physiatrist is providing close team supervision and 24 hour management of active medical problems listed below. Physiatrist and rehab team continue to assess barriers to discharge/monitor patient progress toward functional and medical goals  Care  Tool:  Bathing    Body parts bathed by patient: Right arm, Left arm, Chest, Abdomen, Front perineal area, Buttocks, Right upper leg, Left upper leg, Right lower leg, Left lower leg, Face         Bathing assist Assist Level: Contact Guard/Touching assist     Upper Body Dressing/Undressing Upper body dressing   What is the patient wearing?: Pull over shirt    Upper body assist Assist Level: Supervision/Verbal cueing    Lower Body Dressing/Undressing Lower body dressing      What is the patient wearing?: Underwear/pull up, Pants     Lower body assist Assist for lower body dressing: Minimal Assistance - Patient > 75%     Toileting Toileting    Toileting assist Assist for toileting: Minimal Assistance - Patient > 75%     Transfers Chair/bed transfer  Transfers assist     Chair/bed transfer assist level: Minimal Assistance - Patient > 75%     Locomotion Ambulation   Ambulation assist      Assist level: Moderate Assistance - Patient 50 - 74% Assistive device: No Device Max distance: 50'   Walk 10 feet activity   Assist     Assist level: Moderate Assistance - Patient -  50 - 74% Assistive device: No Device   Walk 50 feet activity   Assist    Assist level: Moderate Assistance - Patient - 50 - 74% Assistive device: No Device    Walk 150 feet activity   Assist Walk 150 feet activity did not occur: Safety/medical concerns (endurnace)         Walk 10 feet on uneven surface  activity   Assist     Assist level: Minimal Assistance - Patient > 75% Assistive device: Walker-rolling   Wheelchair     Assist Is the patient using a wheelchair?: Yes Type of Wheelchair: Manual    Wheelchair assist level: Dependent - Patient 0%      Wheelchair 50 feet with 2 turns activity    Assist        Assist Level: Dependent - Patient 0%   Wheelchair 150 feet activity     Assist      Assist Level: Dependent - Patient 0%   Blood  pressure 116/75, pulse 85, temperature 99 F (37.2 C), temperature source Oral, resp. rate 18, height 5' 3 (1.6 m), weight 61.8 kg, SpO2 100%.   Medical Problem List and Plan: 1. 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  Antithrombotics: -DVT/anticoagulation:  Mechanical: Sequential compression devices, below knee Bilateral lower extremities  -dopplers reviewed and negative Pharmaceutical: Heparin              -antiplatelet therapy: continue Aspirin  81 mg --No DAPT due to recent ICH   3. Pain Management:con't tylenol  as needed   4. Mood/Behavior/Sleep: LCSW to follow for evaluation and support when available.              -antipsychotic agents: N/A              -sleep patterns better   5. Neuropsych/cognition: This patient is not capable of making decisions on her own behalf.   6. Skin/Wound Care: Routine pressure relief measures.    7. AKI on CKD: IVF started at 66mls/hr.    11/29 labs fairly stable. May be close to her renal baseline  11/30 continue to push po.     -daughter trying to push po with her also   -pt will need cueing 9. Hx recent ICH: continue Lipitor 40 mg daily    10. Hx stroke/TIA: Stroke 2014-no significant residual deficit. Continue statin.    11. Seizure: Episode on 11/22, EEG negative for seizure activity. Loaded with Keppra , now on Vimpat  50 mg bid.    12. 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 Avoid low BP-Longterm BP goal 130-150 systolic given severe stenosis/occlusion of left MI and P1.              -  orthostatics improved. Continue to monitor    13.Hx of HTN: continue home regime- Norvac and Valsartan .  Same as above.    14. HLD: was on Pitavastatin  at home, continue Atorvastatin  40 mg daily.    15.   Fever/leukocytosis: Intermittent fevers   -Repeat Chest xray/urinalysis/Blood cultures and venous doppler and CT abd/pelvis d/t recent ICH.     11/27-  CT- went over with pt/family- looks OK except constipation and subcentimeter hypodensity in liver  11/30 WBC's trending down --9.5 11/29   -UA +, empiric keflex  on board   -UCX with diptheroids-   -Blood cx NGTD 3days   -Tmx 99.3 yesterday, HR in 80's this morning.     -would continue keflex  for now pending blood cx, resolution of temp   -pt looks comfortable, no outward signs of infection   -cbc in am 16. Tachycardia Toprol  XL started HS   11/27- will monitor for OH since been having issues- HR down to 99 this AM  -11/28 may be due to above 18. Constipation  11/27- LBM after sorbitol --needs to eat more!  11/30 add senna-s bid         LOS: 4 days A FACE TO FACE EVALUATION WAS PERFORMED  Rhonda Cortez 09/12/2024, 8:23 AM

## 2024-09-13 DIAGNOSIS — I10 Essential (primary) hypertension: Secondary | ICD-10-CM

## 2024-09-13 LAB — CBC WITH DIFFERENTIAL/PLATELET
Abs Immature Granulocytes: 0.04 K/uL (ref 0.00–0.07)
Basophils Absolute: 0 K/uL (ref 0.0–0.1)
Basophils Relative: 0 %
Eosinophils Absolute: 0.2 K/uL (ref 0.0–0.5)
Eosinophils Relative: 2 %
HCT: 38.7 % (ref 36.0–46.0)
Hemoglobin: 13 g/dL (ref 12.0–15.0)
Immature Granulocytes: 1 %
Lymphocytes Relative: 40 %
Lymphs Abs: 3.4 K/uL (ref 0.7–4.0)
MCH: 29.3 pg (ref 26.0–34.0)
MCHC: 33.6 g/dL (ref 30.0–36.0)
MCV: 87.2 fL (ref 80.0–100.0)
Monocytes Absolute: 0.7 K/uL (ref 0.1–1.0)
Monocytes Relative: 8 %
Neutro Abs: 4.1 K/uL (ref 1.7–7.7)
Neutrophils Relative %: 49 %
Platelets: 464 K/uL — ABNORMAL HIGH (ref 150–400)
RBC: 4.44 MIL/uL (ref 3.87–5.11)
RDW: 12.1 % (ref 11.5–15.5)
WBC: 8.4 K/uL (ref 4.0–10.5)
nRBC: 0 % (ref 0.0–0.2)

## 2024-09-13 LAB — BASIC METABOLIC PANEL WITH GFR
Anion gap: 10 (ref 5–15)
BUN: 23 mg/dL (ref 8–23)
CO2: 22 mmol/L (ref 22–32)
Calcium: 9.2 mg/dL (ref 8.9–10.3)
Chloride: 106 mmol/L (ref 98–111)
Creatinine, Ser: 1.76 mg/dL — ABNORMAL HIGH (ref 0.44–1.00)
GFR, Estimated: 32 mL/min — ABNORMAL LOW (ref >60)
Glucose, Bld: 107 mg/dL — ABNORMAL HIGH (ref 70–99)
Potassium: 4 mmol/L (ref 3.5–5.1)
Sodium: 138 mmol/L (ref 135–145)

## 2024-09-13 LAB — CULTURE, BLOOD (ROUTINE X 2)
Culture: NO GROWTH
Culture: NO GROWTH
Special Requests: ADEQUATE

## 2024-09-13 MED ORDER — ACETAMINOPHEN 325 MG PO TABS: 650.0000 mg | ORAL_TABLET | ORAL | Status: AC | PRN

## 2024-09-13 MED ORDER — AMLODIPINE BESYLATE 5 MG PO TABS
5.0000 mg | ORAL_TABLET | Freq: Every day | ORAL | Status: DC
  Administered 2024-09-14 – 2024-09-22 (×9): 5 mg via ORAL
  Filled 2024-09-13 (×9): qty 1

## 2024-09-13 MED ORDER — TOPIRAMATE 25 MG PO TABS
25.0000 mg | ORAL_TABLET | Freq: Two times a day (BID) | ORAL | Status: DC
  Administered 2024-09-13 – 2024-09-22 (×19): 25 mg via ORAL
  Filled 2024-09-13 (×19): qty 1

## 2024-09-13 NOTE — Progress Notes (Signed)
 Patient ID: Rhonda Cortez, female   DOB: [birth year], 63 y.o.   MRN: [id number]  Have left pt's paperwork in her top dresser drawer and sister will get when here visiting today. Will update on Wednesday after team conference.

## 2024-09-13 NOTE — Plan of Care (Signed)
  Problem: RH BOWEL ELIMINATION Goal: RH STG MANAGE BOWEL WITH ASSISTANCE Description: STG Manage Bowel with mod I Assistance. Outcome: Progressing   Problem: RH BOWEL ELIMINATION Goal: RH STG MANAGE BOWEL W/MEDICATION W/ASSISTANCE Description: STG Manage Bowel with Medication with  mod I Assistance. Outcome: Progressing   Problem: RH SAFETY Goal: RH STG ADHERE TO SAFETY PRECAUTIONS W/ASSISTANCE/DEVICE Description: STG Adhere to Safety Precautions With cues Assistance/Device. Outcome: Progressing

## 2024-09-13 NOTE — Progress Notes (Signed)
 Physical Therapy Session Note  Patient Details  Name: Rhonda Cortez MRN: [id number] Date of Birth: September 13, 1961  Today's Date: 09/13/2024 PT Individual Time: 8582-8484 PT Individual Time Calculation (min): 58 min   Short Term Goals: Week 1:  PT Short Term Goal 1 (Week 1): Pt will perform transfers with CGA PT Short Term Goal 2 (Week 1): Pt will demonstrate dynamic standing balance with CGA PT Short Term Goal 3 (Week 1): Pt will perform gait x 150' with CGA PT Short Term Goal 4 (Week 1): Pt will perform stairs with CGA  Skilled Therapeutic Interventions/Progress Updates:      Pt being fed lunch by a friend who was at the bedside. Pt needing encouragement to participate - pt denies any pain, just fatigued.   Bed mobility completed at supervision level. Sit<>stand to RW with supervision. She ambulates at supervision level while using the RW from her room to the main gym, ~150'. Cues for forward gaze and increasing her gait speed.   Pt positioned into quadruped on mat table without assist, cues only for safety and setup. -modified bird dog with arms only -modified bird dog with legs only -full bird dog with alternating legs/arms -dead bug isometric holds -bilateral heel taps with core engagement -2x12 for each, rest breaks b/w sets for recovery.   Setup minefield of cones to work on dual-cog tasks for sequencing. When patient was instructed to locate a certain colored cone, patient unable to locate (unable to distinguish blue vs red vs green, reports she's not colorblind). Upon further testing, patient's aphasia/anomia and word finding difficulties quite impaired. Worked on naming simple objects (christmas tree, sink, stairs, glasses, shirt) while incorporating functional mobility training. Pt needing max/totalA for naming these objects, even when provided cues.   Stair climbing completed using 6 steps and 2 hand rails. She navigated x12 total at a SBA to CGA level with no LOB and fair  safety awareness.   Ambulated back to her room with similar assist, cues needed for locating room #. Ended treatment in bed with alarm on, needs met, visitor at the bedside.   Therapy Documentation Precautions:  Precautions Precautions: Fall Restrictions Weight Bearing Restrictions Per Provider Order: No General:      Therapy/Group: Individual Therapy  Sherlean SHAUNNA Perks 09/13/2024, 7:56 AM

## 2024-09-13 NOTE — Progress Notes (Signed)
 Speech Language Pathology Daily Session Note  Patient Details  Name: MARLOW HENDRIE MRN: [id number] Date of Birth: 1961-10-03  Today's Date: 09/13/2024 SLP Individual Time: 9199-9155 SLP Individual Time Calculation (min): 44 min  Short Term Goals: Week 1: SLP Short Term Goal 1 (Week 1): Patient will demonstrate sustained attention to functional tasks with mod multimodal A SLP Short Term Goal 2 (Week 1): Patient will verbally communicate wants/needs given mod multimodal A SLP Short Term Goal 3 (Week 1): Patient will answer complex yes/no questions with 90% accuracy given mod multimodal A SLP Short Term Goal 4 (Week 1): Patient will name functional items with 60% accuracy given mod multimodal A  Skilled Therapeutic Interventions: Skilled therapy session focused on communication goals. SLP facilitated session by utilizing yes/no questions for patient to communicate what she ate for breakfast. SLP then prompted completion of automatic speech tasks. Patient independently recalled name, however required initial item in sequence for numbers, days of the week and months of the year. SLP continued to target expressive language through confrontational naming task. Patient required maxA to name functional items with 50% accuracy. Patient with continued instances of word finding difficulty and perseveration. Patient then identified items out of a FO3 with 80% accuracy independently. Patient left in bed with alarm set and call bell in reach. Continue POC.  Pain None reported   Therapy/Group: Individual Therapy  Lynniah Janoski M.A., CCC-SLP 09/13/2024, 7:43 AM

## 2024-09-13 NOTE — Progress Notes (Signed)
 Occupational Therapy Session Note  Patient Details  Name: SENIE LANESE MRN: [id number] Date of Birth: [birth year]  Today's Date: 09/13/2024 OT Individual Time: 8693-8654 OT Individual Time Calculation (min): 39 min    Short Term Goals: Week 1:  OT Short Term Goal 1 (Week 1): pt will be able to ambulate to bathroom with RW with min A. OT Short Term Goal 2 (Week 1): Pt will be able to don all LB clothing with supervision. OT Short Term Goal 3 (Week 1): Pt will be able to hold balance safely on toilet and toilet with Supervision. OT Short Term Goal 4 (Week 1): Pt will be able to stand at sink for grooming tasks with CGA.  Skilled Therapeutic Interventions/Progress Updates:    Pt received supine with no c/o pain, agreeable to OT session although initially reluctant and OT having to offer multiple options to encourage participation. She completed chest press alternated with bicep curl and then tricep extension with a 4 lb dowel to increase UB strengthening needed for ADL/IADLs. 3x12 repetitions. She required min cueing for sequencing at times. She then completed 3x10 repetitions modified sit ups with dowel reach. She ended with scapular retractions with resistance band, 2x15 repetitions. She was left supine with all needs met.    Therapy Documentation Precautions:  Precautions Precautions: Fall Restrictions Weight Bearing Restrictions Per Provider Order: No   Therapy/Group: Individual Therapy  Nena VEAR Moats 09/13/2024, 8:09 AM

## 2024-09-13 NOTE — Progress Notes (Signed)
 PROGRESS NOTE   Subjective/Complaints:  HA persistent discussed ICH and expect slow resolution  Bloodwork looks good except creatinine crept up a bit  ROS: Limited due to cognitive/behavioral   Objective:   No results found.  Recent Labs    09/11/24 0506 09/13/24 0501  WBC 9.5 8.4  HGB 13.4 13.0  HCT 39.2 38.7  PLT 407* 464*   Recent Labs    09/11/24 0506 09/13/24 0501  NA 135 138  K 4.0 4.0  CL 104 106  CO2 20* 22  GLUCOSE 110* 107*  BUN 23 23  CREATININE 1.93* 1.76*  CALCIUM  9.1 9.2    Intake/Output Summary (Last 24 hours) at 09/13/2024 1013 Last data filed at 09/13/2024 0700 Gross per 24 hour  Intake 530 ml  Output --  Net 530 ml        Physical Exam: Vital Signs Blood pressure 104/68, pulse 76, temperature 98.3 F (36.8 C), temperature source Oral, resp. rate 18, height 5' 3 (1.6 m), weight 61.8 kg, SpO2 100%.    General: No acute distress Mood and affect are appropriate Heart: Regular rate and rhythm no rubs murmurs or extra sounds Lungs: Clear to auscultation, breathing unlabored, no rales or wheezes Abdomen: Positive bowel sounds, soft nontender to palpation, nondistended Extremities: No clubbing, cyanosis, or edema Skin: No evidence of breakdown, no evidence of rash Neurologic: Cranial nerves II through XII intact, motor strength is 5/5 in bilateral deltoid, bicep, tricep, grip, hip flexor, knee extensors, ankle dorsiflexor and plantar flexor Oriented to person and place not time  Cerebellar exam normal finger to nose to finger as well as heel to shin in bilateral upper and lower extremities Musculoskeletal: Full range of motion in all 4 extremities. No joint swelling  Neuro: Pt alert. Word finding and processing delays. Persistent right sided facial droop. Speech sl dysarthric. Mild right HP with decreased FMC . Sl decrease in right sided sensation of light touch. Musc: Full ROM, No pain  with AROM or PROM in the neck, trunk, or extremities. Posture appropriate    Assessment/Plan: 1. Functional deficits which require 3+ hours per day of interdisciplinary therapy in a comprehensive inpatient rehab setting. Physiatrist is providing close team supervision and 24 hour management of active medical problems listed below. Physiatrist and rehab team continue to assess barriers to discharge/monitor patient progress toward functional and medical goals  Care Tool:  Bathing    Body parts bathed by patient: Right arm, Left arm, Chest, Abdomen, Front perineal area, Buttocks, Right upper leg, Left upper leg, Right lower leg, Left lower leg, Face         Bathing assist Assist Level: Contact Guard/Touching assist     Upper Body Dressing/Undressing Upper body dressing   What is the patient wearing?: Pull over shirt    Upper body assist Assist Level: Supervision/Verbal cueing    Lower Body Dressing/Undressing Lower body dressing      What is the patient wearing?: Underwear/pull up, Pants     Lower body assist Assist for lower body dressing: Minimal Assistance - Patient > 75%     Toileting Toileting    Toileting assist Assist for toileting: Minimal Assistance - Patient >  75%     Transfers Chair/bed transfer  Transfers assist     Chair/bed transfer assist level: Minimal Assistance - Patient > 75%     Locomotion Ambulation   Ambulation assist      Assist level: Moderate Assistance - Patient 50 - 74% Assistive device: No Device Max distance: 50'   Walk 10 feet activity   Assist     Assist level: Moderate Assistance - Patient - 50 - 74% Assistive device: No Device   Walk 50 feet activity   Assist    Assist level: Moderate Assistance - Patient - 50 - 74% Assistive device: No Device    Walk 150 feet activity   Assist Walk 150 feet activity did not occur: Safety/medical concerns (endurnace)         Walk 10 feet on uneven surface   activity   Assist     Assist level: Minimal Assistance - Patient > 75% Assistive device: Walker-rolling   Wheelchair     Assist Is the patient using a wheelchair?: Yes Type of Wheelchair: Manual    Wheelchair assist level: Dependent - Patient 0%      Wheelchair 50 feet with 2 turns activity    Assist        Assist Level: Dependent - Patient 0%   Wheelchair 150 feet activity     Assist      Assist Level: Dependent - Patient 0%   Blood pressure 104/68, pulse 76, temperature 98.3 F (36.8 C), temperature source Oral, resp. rate 18, height 5' 3 (1.6 m), weight 61.8 kg, SpO2 100%.   Medical Problem List and Plan: 1. 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  Antithrombotics: -DVT/anticoagulation:  Mechanical: Sequential compression devices, below knee Bilateral lower extremities  -dopplers reviewed and negative Pharmaceutical: Heparin              -antiplatelet therapy: continue Aspirin  81 mg --No DAPT due to recent ICH   3. Pain Management:con't tylenol  as needed Add Topirimate for HA    4. Mood/Behavior/Sleep: LCSW to follow for evaluation and support when available.              -antipsychotic agents: N/A              -sleep patterns better   5. Neuropsych/cognition: This patient is not capable of making decisions on her own behalf.   6. Skin/Wound Care: Routine pressure relief measures.    7. AKI on CKD: IVF started at 62mls/hr.        Latest Ref Rng & Units 09/13/2024    5:01 AM 09/11/2024    5:06 AM 09/10/2024    6:08 AM  BMP  Glucose 70 - 99 mg/dL 892  889  892   BUN 8 - 23 mg/dL 23  23  23    Creatinine 0.44 - 1.00 mg/dL 8.23  8.06   8.27   Sodium 135 - 145 mmol/L 138  135  136   Potassium 3.5 - 5.1 mmol/L 4.0  4.0  4.1  Chloride 98 - 111 mmol/L 106  104  107   CO2 22 - 32 mmol/L 22  20  18    Calcium  8.9 - 10.3 mg/dL 9.2  9.1  8.6   87/8 Improving, enc po  9. Hx recent ICH: continue Lipitor 40 mg daily    10. Hx stroke/TIA: Stroke 2014-no significant residual deficit. Continue statin.    11. Seizure: Episode on 11/22, EEG negative for seizure activity. Loaded with Keppra , now on Vimpat  50 mg bid.    12. 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 Avoid low BP-Longterm BP goal 130-150 systolic given severe stenosis/occlusion of left MI and P1.              -  orthostatics improved. Continue to monitor    13.Hx of HTN: continue home regime- Norvac and Valsartan .    not on ACE-I, will reduce  14. HLD: was on Pitavastatin  at home, continue Atorvastatin  40 mg daily.    15.  Fever/leukocytosis leukocytosis resolved  -Repeat Chest xray/urinalysis/Blood cultures and venous doppler and CT abd/pelvis d/t recent ICH.     11/27- CT- went over with pt/family- looks OK except constipation and subcentimeter hypodensity in liver  12/1 WBCs normal    -UCX with diptheroids-d/c keflex  , has completed 5 d course   -Blood cx NGTD 3days       Latest Ref Rng & Units 09/13/2024    5:01 AM 09/11/2024    5:06 AM 09/10/2024    6:08 AM  CBC  WBC 4.0 - 10.5 K/uL 8.4  9.5  12.7   Hemoglobin 12.0 - 15.0 g/dL 86.9  86.5  87.9   Hematocrit 36.0 - 46.0 % 38.7  39.2  35.0   Platelets 150 - 400 K/uL 464  407  372     16. Tachycardia improved on Toprol  XL but BPs on low side will reduce amlodipine  and check Ortho vitals    Vitals:   09/12/24 1947 09/13/24 0611  BP: 107/62 104/68  Pulse: 88 76  Resp: 19 18  Temp: 98.8 F (37.1 C) 98.3 F (36.8 C)  SpO2: 100% 100%    18. Constipation  11/27- LBM after sorbitol --needs to eat more!  11/30 add senna-s bid          LOS: 5 days A FACE TO FACE EVALUATION WAS PERFORMED  Prentice FORBES Compton 09/13/2024, 10:13 AM

## 2024-09-13 NOTE — Progress Notes (Signed)
 Occupational Therapy Session Note  Patient Details  Name: Rhonda Cortez MRN: [id number] Date of Birth: [birth year]  Today's Date: 09/13/2024 OT Individual Time: 1045-1130 OT Individual Time Calculation (min): 45 min    Short Term Goals: Week 1:  OT Short Term Goal 1 (Week 1): pt will be able to ambulate to bathroom with RW with min A. OT Short Term Goal 2 (Week 1): Pt will be able to don all LB clothing with supervision. OT Short Term Goal 3 (Week 1): Pt will be able to hold balance safely on toilet and toilet with Supervision. OT Short Term Goal 4 (Week 1): Pt will be able to stand at sink for grooming tasks with CGA.  Skilled Therapeutic Interventions/Progress Updates:    Pt received in bed and agreeable to a shower.  Today pt demonstrated a great deal of improvement with postural control and balance, as she sat to EOB independently, stood to RW with close S, and ambulated with CGA with RW to bathoom to undress. As she turned around to step backward to tub bench had a LOB with min A to recover. Pt needs to continue to work on dynamic balance and reaction/protective responses.   Pt sat on bench to shower and stood to wash bottom,  using grab bar for support.    Transferred to toilet to dress, with supervision,  ambulated to sink to brush teeth standing at sink and then sat in w/c.   Pt requesting to lay down but encouraged her to stay in wc as lunch arriving soon.  Belt alarm on and all needs met. Telesitter on.   Therapy Documentation Precautions:  Precautions Precautions: Fall Restrictions Weight Bearing Restrictions Per Provider Order: No    Pain: no c/o pain    ADL: ADL Eating: Set up Grooming: Supervision/safety Upper Body Bathing: Supervision/safety, Minimal cueing Where Assessed-Upper Body Bathing: Shower Lower Body Bathing: Supervision/safety, Minimal cueing Where Assessed-Lower Body Bathing: Shower Upper Body Dressing: Supervision/safety Where Assessed-Upper Body  Dressing: Chair Lower Body Dressing: Supervision/safety Where Assessed-Lower Body Dressing: Chair Toileting: Supervision/safety Where Assessed-Toileting: Teacher, Adult Education: Furniture Conservator/restorer Method: Proofreader: Acupuncturist: Administrator, Arts Method: Designer, Industrial/product: Emergency planning/management officer, Grab bars   Therapy/Group: Individual Therapy  Verneda Hollopeter 09/13/2024, 12:35 PM

## 2024-09-13 NOTE — Plan of Care (Signed)
  Problem: Consults Goal: RH STROKE PATIENT EDUCATION Description: See Patient Education module for education specifics  Outcome: Progressing   Problem: RH BOWEL ELIMINATION Goal: RH STG MANAGE BOWEL WITH ASSISTANCE Description: STG Manage Bowel with mod I  Assistance. Outcome: Progressing Goal: RH STG MANAGE BOWEL W/MEDICATION W/ASSISTANCE Description: STG Manage Bowel with Medication with mod I Assistance. Outcome: Progressing   Problem: RH SAFETY Goal: RH STG ADHERE TO SAFETY PRECAUTIONS W/ASSISTANCE/DEVICE Description: STG Adhere to Safety Precautions With cues Assistance/Device. Outcome: Progressing   Problem: RH KNOWLEDGE DEFICIT Goal: RH STG INCREASE KNOWLEDGE OF HYPERTENSION Description: Patient and sister will be able to manage HTN using educational resources for medications an dietary modification independently Outcome: Progressing Goal: RH STG INCREASE KNOWLEGDE OF HYPERLIPIDEMIA Description: Patient and sister will be able to manage HLD using educational resources for medications an dietary modification independently Outcome: Progressing Goal: RH STG INCREASE KNOWLEDGE OF STROKE PROPHYLAXIS Description: Patient and sister will be able to manage secondary risks using educational resources for medications an dietary modification independently Outcome: Progressing

## 2024-09-13 NOTE — Discharge Instructions (Addendum)
 Inpatient Rehab Discharge Instructions  Rhonda Cortez Discharge date and time:    Activities/Precautions/ Functional Status: Activity: no lifting, driving, or strenuous exercise till cleared by MD Diet: cardiac diet--limit carbs/sweets Wound Care: none needed   Functional status:  ___ No restrictions     ___ Walk up steps independently _X__ 24/7 supervision/assistance   ___ Walk up steps with assistance ___ Intermittent supervision/assistance  ___ Bathe/dress independently ___ Walk with walker     ___ Bathe/dress with assistance ___ Walk Independently    ___ Shower independently ___ Walk with assistance    _X__ Shower with assistance _X__ No alcohol     ___ Return to work/school ________  Special Instructions    COMMUNITY REFERRALS UPON DISCHARGE:     Outpatient: PT    OT     SP             Agency:[facility]  [address] [place] [address] Phone:[phone]              Appointment Date/Time:WILL CALL TO SET UP FOLLOW UP APPOINTMENTS  Medical Equipment/Items Ordered: NO NEEDS                                                 Agency/Supplier:NA    STROKE/TIA DISCHARGE INSTRUCTIONS SMOKING Cigarette smoking nearly doubles your risk of having a stroke & is the single most alterable risk factor  If you smoke or have smoked in the last 12 months, you are advised to quit smoking for your health. Most of the excess cardiovascular risk related to smoking disappears within a year of stopping. Ask you doctor about anti-smoking medications [place] Quit Line: 1-800-QUIT NOW Free Smoking Cessation Classes [phone]  CHOLESTEROL Know your levels; limit fat & cholesterol in your diet  Lipid Panel     Component Value Date/Time   CHOL 241 (H) 08/29/2024 0535   TRIG 119 08/29/2024 0535   HDL 53 08/29/2024 0535   CHOLHDL 4.5 08/29/2024 0535   VLDL 24 08/29/2024 0535   LDLCALC 164 (H) 08/29/2024 0535     Many patients benefit from treatment even if their  cholesterol is at goal. Goal: Total Cholesterol (CHOL) less than 160 Goal:  Triglycerides (TRIG) less than 150 Goal:  HDL greater than 40 Goal:  LDL (LDLCALC) less than 100   BLOOD PRESSURE American Stroke Association blood pressure target is less that 120/80 mm/Hg  Your discharge blood pressure is:  BP: 104/68 Monitor your blood pressure Limit your salt and alcohol intake Many individuals will require more than one medication for high blood pressure  DIABETES (A1c is a blood sugar average for last 3 months) Goal HGBA1c is under 7% (HBGA1c is blood sugar average for last 3 months)  Diabetes: No known diagnosis of diabetes    Lab Results  Component Value Date   HGBA1C 5.8 (H) 08/28/2024    Your HGBA1c can be lowered with medications, healthy diet, and exercise. Check your blood sugar as directed by your physician Call your physician if you experience unexplained or low blood sugars.  PHYSICAL ACTIVITY/REHABILITATION Goal is 30 minutes at least 4 days per week  Activity: No driving, Therapies: see above Return to work: N/A at this time Activity decreases your risk of heart attack and stroke and makes your heart stronger.  It helps control your weight  and blood pressure; helps you relax and can improve your mood. Participate in a regular exercise program. Talk with your doctor about the best form of exercise for you (dancing, walking, swimming, cycling).  DIET/WEIGHT Goal is to maintain a healthy weight  Your discharge diet is:  Diet Order             Diet Heart Room service appropriate? Yes; Fluid consistency: Thin  Diet effective now                   liquids Your height is:  Height: 5' 3 (160 cm) Your current weight is: 136 lbs Your Body Mass Index (BMI) is:  BMI (Calculated): 24.14 Following the type of diet specifically designed for you will help prevent another stroke. You are at goal weight  Your goal Body Mass Index (BMI) is 19-24. Healthy food habits can help  reduce 3 risk factors for stroke:  High cholesterol, hypertension, and excess weight.  RESOURCES Stroke/Support Group:  Call [phone]   STROKE EDUCATION PROVIDED/REVIEWED AND GIVEN TO PATIENT Stroke warning signs and symptoms How to activate emergency medical system (call 911). Medications prescribed at discharge. Need for follow-up after discharge. Personal risk factors for stroke. Pneumonia vaccine given:  Flu vaccine given:  My questions have been answered, the writing is legible, and I understand these instructions.  I will adhere to these goals & educational materials that have been provided to me after my discharge from the hospital.     My questions have been answered and I understand these instructions. I will adhere to these goals and the provided educational materials after my discharge from the hospital.  Patient/Caregiver Signature _______________________________ Date __________  Clinician Signature _______________________________________ Date __________  Please bring this form and your medication list with you to all your follow-up doctor's appointments.

## 2024-09-14 MED ORDER — SORBITOL 70 % SOLN
30.0000 mL | Freq: Every day | Status: DC | PRN
  Administered 2024-09-21: 30 mL via ORAL
  Filled 2024-09-14: qty 30

## 2024-09-14 MED ORDER — SORBITOL 70 % SOLN: 30.0000 mL | Freq: Once | Status: DC

## 2024-09-14 NOTE — Progress Notes (Signed)
 Occupational Therapy Session Note  Patient Details  Name: Rhonda Cortez MRN: [id number] Date of Birth: 02/20/1961  Today's Date: 09/14/2024 OT Individual Time: 9084-8984 OT Individual Time Calculation (min): 60 min    Short Term Goals: Week 1:  OT Short Term Goal 1 (Week 1): pt will be able to ambulate to bathroom with RW with min A. OT Short Term Goal 2 (Week 1): Pt will be able to don all LB clothing with supervision. OT Short Term Goal 3 (Week 1): Pt will be able to hold balance safely on toilet and toilet with Supervision. OT Short Term Goal 4 (Week 1): Pt will be able to stand at sink for grooming tasks with CGA.  Skilled Therapeutic Interventions/Progress Updates:    Pt received in bed sleeping and it took several minutes for her to wake up. Pt initially not wanting to get up but with strong encouragement she was able to get up.  S with bed mobility.  Pt moved very slowly but eventually put on her socks.   Encouraged pt to toilet, she ambulated without AD with light CGA to toilet, toileted with distant S and then ambulated to sink.  Needed cues to initiate washing hands, brushing teeth, washing toothpaste off lips, etc.  Needed cues with turning water on and off and finding toothpaste on the R side of the sink. Once finished sat down on bed to rest.   Talked to her about how I saw a note that a friend was spoon feeding her.  Explained that to return to her highest level of function she needs to complete tasks she is capable of. Pt is capable of feeding herself. If pt is too tired to do that than she is likely too tired to swallow safely.  To assess perception in more detail, had pt do a house copy design. Needed max cues and demonstrated impaired coordination holding pen.  Asked her to write her name and pt making straight lines in similar pattern as letters but not forming letters correctly. To ensure she understood the directions, I asked what she was writing and she said my name. Tried  a clock draw activity, but pt having extreme difficulty.  Broke down the task by by having pt fill in the missing numbers but she continued to need max A. Had pt look at wall clock for reference.  Pt unable to read clock but pt states her vision is not blurry.    Her processing skills are very slow and I need to continue to try to assess what perceptual deficits are occurring. Encouraged pt to go for a walk around nursing station with RW.  Pt ambulates with close S with RW but moves very slowly.  Tried a 1,2 count to encourage faster gait.  Instead I needed to pull  the walker for pt to step faster and move in a smoother rhythm.  She did well.  Explained that if her physical movements become faster and more fluid that her processing skills will also become more fluid.    Pt returned to room and opted to rest in bed. Alarm and telesitter on.   Therapy Documentation Precautions:  Precautions Precautions: Fall Restrictions Weight Bearing Restrictions Per Provider Order: No   Pain: Pain Assessment Pain Scale: 0-10 Pain Score: 0-No pain ADL: ADL Eating: Set up Grooming: Supervision/safety Upper Body Bathing: Supervision/safety, Minimal cueing Where Assessed-Upper Body Bathing: Shower Lower Body Bathing: Supervision/safety, Minimal cueing Where Assessed-Lower Body Bathing: Shower Upper Body Dressing: Supervision/safety Where  Assessed-Upper Body Dressing: Chair Lower Body Dressing: Supervision/safety Where Assessed-Lower Body Dressing: Chair Toileting: Supervision/safety Where Assessed-Toileting: Teacher, Adult Education: Furniture Conservator/restorer Method: Proofreader: Acupuncturist: Administrator, Arts Method: Designer, Industrial/product: Emergency planning/management officer, Grab bars   Therapy/Group: Individual Therapy  Rufino Staup 09/14/2024, 10:59 AM

## 2024-09-14 NOTE — Plan of Care (Signed)
  Problem: Consults Goal: RH STROKE PATIENT EDUCATION Description: See Patient Education module for education specifics  Outcome: Progressing   Problem: RH BOWEL ELIMINATION Goal: RH STG MANAGE BOWEL WITH ASSISTANCE Description: STG Manage Bowel with mod I  Assistance. Outcome: Progressing Goal: RH STG MANAGE BOWEL W/MEDICATION W/ASSISTANCE Description: STG Manage Bowel with Medication with mod I Assistance. Outcome: Progressing   Problem: RH SAFETY Goal: RH STG ADHERE TO SAFETY PRECAUTIONS W/ASSISTANCE/DEVICE Description: STG Adhere to Safety Precautions With cues Assistance/Device. Outcome: Progressing   Problem: RH KNOWLEDGE DEFICIT Goal: RH STG INCREASE KNOWLEDGE OF HYPERTENSION Description: Patient and sister will be able to manage HTN using educational resources for medications an dietary modification independently Outcome: Progressing Goal: RH STG INCREASE KNOWLEGDE OF HYPERLIPIDEMIA Description: Patient and sister will be able to manage HLD using educational resources for medications an dietary modification independently Outcome: Progressing Goal: RH STG INCREASE KNOWLEDGE OF STROKE PROPHYLAXIS Description: Patient and sister will be able to manage secondary risks using educational resources for medications an dietary modification independently Outcome: Progressing

## 2024-09-14 NOTE — Progress Notes (Signed)
 Physical Therapy Session Note  Patient Details  Name: Rhonda Cortez MRN: [id number] Date of Birth: 1961-03-29  Today's Date: 09/14/2024 PT Individual Time: 1105-1200 PT Individual Time Calculation (min): 55 min   Short Term Goals: Week 1:  PT Short Term Goal 1 (Week 1): Pt will perform transfers with CGA PT Short Term Goal 2 (Week 1): Pt will demonstrate dynamic standing balance with CGA PT Short Term Goal 3 (Week 1): Pt will perform gait x 150' with CGA PT Short Term Goal 4 (Week 1): Pt will perform stairs with CGA  Skilled Therapeutic Interventions/Progress Updates: Patient semi-reclined in bed on entrance to room. Patient alert and agreeable to PT session.   Patient reported 7/10 HA (nsg provided medication per pt request during session).   Therapeutic Activity: Bed Mobility: Pt performed supine<sit on EOB with supervision (HOB elevated) for safety Transfers: Pt performed sit<>stand transfers throughout session with CGA for safety. Provided VC for safe RW management and hand placement.  Gait Training:  Pt ambulated from main gym<room with CGA for safety and cuing to safely navigate obstacles in hallway. Pt also cued to increase step clearance and maintain neutral BOS  Neuromuscular Re-ed: NMR facilitated during session with focus on memory recall, dynamic standing balance. - Course set up with pt starting at mat, then to basketball net to grab horseshoes (place horseshoe on front of RW), then to next station to toss horseshoe to peg target, and then return back to mat. Pt timed to get to each station within 5 seconds (started at 8 seconds on first round). Pt able to get to each station within time limit. Biggest deficit of pt was requiring VC throughout to recall what to do at each station and to return to center of mat vs offset to the R. Pt with CGA throughout for safety. Each station ranged from 7'-10' in distance. Pt performed 2nd round with distance increased to 10' and same 5 second  timer. Pt would ambulate past first station to pick up horseshoe and required VC to recall first step, and to recall next stations (cue to increase visual scan of room to find familiar items (basketball goal and christmas tree). Pt also required max cuing to toss horseshoe to peg target (on last trial, pt would move UE with intention to toss, but would not let go of grasp and required increased time/effort to do so with cue to release after flexing UE forward).   NMR performed for improvements in motor control and coordination, balance, sequencing, judgement, and self confidence/ efficacy in performing all aspects of mobility at highest level of independence.   Patient semi-reclined in bed at end of session with brakes locked, bed alarm set, and all needs within reach.      Therapy Documentation Precautions:  Precautions Precautions: Fall Restrictions Weight Bearing Restrictions Per Provider Order: No  Therapy/Group: Individual Therapy  Rosenda Geffrard PTA 09/14/2024, 12:16 PM

## 2024-09-14 NOTE — Progress Notes (Signed)
 PROGRESS NOTE   Subjective/Complaints:  HA improved pt is smiling  ROS: Limited due to cognitive/behavioral   Objective:   No results found.  Recent Labs    09/13/24 0501  WBC 8.4  HGB 13.0  HCT 38.7  PLT 464*   Recent Labs    09/13/24 0501  NA 138  K 4.0  CL 106  CO2 22  GLUCOSE 107*  BUN 23  CREATININE 1.76*  CALCIUM  9.2   No intake or output data in the 24 hours ending 09/14/24 0844       Physical Exam: Vital Signs Blood pressure 101/63, pulse 77, temperature 98.7 F (37.1 C), temperature source Oral, resp. rate 18, height 5' 3 (1.6 m), weight 60.1 kg, SpO2 98%.    General: No acute distress Mood and affect are appropriate Heart: Regular rate and rhythm no rubs murmurs or extra sounds Lungs: Clear to auscultation, breathing unlabored, no rales or wheezes Abdomen: Positive bowel sounds, soft nontender to palpation, nondistended Extremities: No clubbing, cyanosis, or edema Skin: No evidence of breakdown, no evidence of rash Neurologic: Cranial nerves II through XII intact, motor strength is 5/5 in bilateral deltoid, bicep, tricep, grip, hip flexor, knee extensors, ankle dorsiflexor and plantar flexor Oriented to person and place not time  Cerebellar exam normal finger to nose to finger as well as heel to shin in bilateral upper and lower extremities Musculoskeletal: Full range of motion in all 4 extremities. No joint swelling  Neuro: Pt alert. Word finding and processing delays. Persistent right sided facial droop. Speech sl dysarthric. Mild right HP with decreased FMC . Sl decrease in right sided sensation of light touch. Musc: Full ROM, No pain with AROM or PROM in the neck, trunk, or extremities. Posture appropriate    Assessment/Plan: 1. Functional deficits which require 3+ hours per day of interdisciplinary therapy in a comprehensive inpatient rehab setting. Physiatrist is providing close  team supervision and 24 hour management of active medical problems listed below. Physiatrist and rehab team continue to assess barriers to discharge/monitor patient progress toward functional and medical goals  Care Tool:  Bathing    Body parts bathed by patient: Right arm, Left arm, Chest, Abdomen, Front perineal area, Buttocks, Right upper leg, Left upper leg, Right lower leg, Left lower leg, Face         Bathing assist Assist Level: Contact Guard/Touching assist     Upper Body Dressing/Undressing Upper body dressing   What is the patient wearing?: Pull over shirt    Upper body assist Assist Level: Supervision/Verbal cueing    Lower Body Dressing/Undressing Lower body dressing      What is the patient wearing?: Underwear/pull up, Pants     Lower body assist Assist for lower body dressing: Minimal Assistance - Patient > 75%     Toileting Toileting    Toileting assist Assist for toileting: Minimal Assistance - Patient > 75%     Transfers Chair/bed transfer  Transfers assist     Chair/bed transfer assist level: Minimal Assistance - Patient > 75%     Locomotion Ambulation   Ambulation assist      Assist level: Moderate Assistance - Patient 50 -  74% Assistive device: No Device Max distance: 50'   Walk 10 feet activity   Assist     Assist level: Moderate Assistance - Patient - 50 - 74% Assistive device: No Device   Walk 50 feet activity   Assist    Assist level: Moderate Assistance - Patient - 50 - 74% Assistive device: No Device    Walk 150 feet activity   Assist Walk 150 feet activity did not occur: Safety/medical concerns (endurnace)         Walk 10 feet on uneven surface  activity   Assist     Assist level: Minimal Assistance - Patient > 75% Assistive device: Walker-rolling   Wheelchair     Assist Is the patient using a wheelchair?: Yes Type of Wheelchair: Manual    Wheelchair assist level: Dependent - Patient  0%      Wheelchair 50 feet with 2 turns activity    Assist        Assist Level: Dependent - Patient 0%   Wheelchair 150 feet activity     Assist      Assist Level: Dependent - Patient 0%   Blood pressure 101/63, pulse 77, temperature 98.7 F (37.1 C), temperature source Oral, resp. rate 18, height 5' 3 (1.6 m), weight 60.1 kg, SpO2 98%.   Medical Problem List and Plan: 1. 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  Antithrombotics: -DVT/anticoagulation:  Mechanical: Sequential compression devices, below knee Bilateral lower extremities  -dopplers reviewed and negative Pharmaceutical: Heparin              -antiplatelet therapy: continue Aspirin  81 mg --No DAPT due to recent ICH   3. Pain Management:con't tylenol  as needed Add Topirimate for HA    4. Mood/Behavior/Sleep: LCSW to follow for evaluation and support when available.              -antipsychotic agents: N/A              -sleep patterns better   5. Neuropsych/cognition: This patient is not capable of making decisions on her own behalf.   6. Skin/Wound Care: Routine pressure relief measures.    7. AKI on CKD: finished IVF       Latest Ref Rng & Units 09/13/2024    5:01 AM 09/11/2024    5:06 AM 09/10/2024    6:08 AM  BMP  Glucose 70 - 99 mg/dL 892  889  892   BUN 8 - 23 mg/dL 23  23  23    Creatinine 0.44 - 1.00 mg/dL 8.23  8.06  8.27   Sodium 135 - 145 mmol/L 138  135  136   Potassium 3.5 - 5.1 mmol/L 4.0  4.0  4.1   Chloride 98 - 111 mmol/L 106  104  107   CO2 22 - 32 mmol/L 22  20  18    Calcium  8.9 - 10.3 mg/dL 9.2  9.1  8.6   87/8 Improving, enc po  9. Hx recent ICH: continue Lipitor 40 mg  daily  10. Hx stroke/TIA: Stroke 2014-no significant residual deficit. Continue statin.    11. Seizure: Episode on 11/22, EEG negative for seizure activity. Loaded with Keppra , now on Vimpat  50 mg bid.    12. 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 Avoid low BP-Longterm BP goal 130-150 systolic given severe stenosis/occlusion of left MI and P1.              -  orthostatics improved. Continue to monitor    13.Hx of HTN: continue home regime- Norvac and Valsartan .    not on ACE-I, will reduce  14. HLD: was on Pitavastatin  at home, continue Atorvastatin  40 mg daily.    15.  Fever/leukocytosis leukocytosis resolved  -Repeat Chest xray/urinalysis/Blood cultures and venous doppler and CT abd/pelvis d/t recent ICH.     11/27- CT- went over with pt/family- looks OK except constipation and subcentimeter hypodensity in liver  12/1 WBCs normal    -UCX with diptheroids-d/c keflex  , has completed 5 d course   -Blood cx NGTD 3days       Latest Ref Rng & Units 09/13/2024    5:01 AM 09/11/2024    5:06 AM 09/10/2024    6:08 AM  CBC  WBC 4.0 - 10.5 K/uL 8.4  9.5  12.7   Hemoglobin 12.0 - 15.0 g/dL 86.9  86.5  87.9   Hematocrit 36.0 - 46.0 % 38.7  39.2  35.0   Platelets 150 - 400 K/uL 464  407  372     16. Tachycardia improved on Toprol  XL but BPs on low side will reduce amlodipine  and check Ortho vitals    Vitals:   09/13/24 1937 09/14/24 0410  BP: 117/72 101/63  Pulse: 81 77  Resp:  18  Temp: 98.3 F (36.8 C) 98.7 F (37.1 C)  SpO2: 98% 98%  Reduced amlodipine  to 5mg  starting 12/2 will monitor effect   18. Constipation  11/27- LBM after sorbitol --needs to eat more!  11/30 add senna-s bid         LOS: 6 days A FACE TO FACE EVALUATION WAS PERFORMED  Rhonda Cortez 09/14/2024, 8:44 AM

## 2024-09-14 NOTE — Progress Notes (Signed)
 Occupational Therapy Session Note  Patient Details  Name: Rhonda Cortez MRN: [id number] Date of Birth: [birth year]  Today's Date: 09/14/2024 OT Individual Time: 1401-1515 OT Individual Time Calculation (min): 74 min    Short Term Goals: Week 1:  OT Short Term Goal 1 (Week 1): pt will be able to ambulate to bathroom with RW with min A. OT Short Term Goal 2 (Week 1): Pt will be able to don all LB clothing with supervision. OT Short Term Goal 3 (Week 1): Pt will be able to hold balance safely on toilet and toilet with Supervision. OT Short Term Goal 4 (Week 1): Pt will be able to stand at sink for grooming tasks with CGA.  Skilled Therapeutic Interventions/Progress Updates:  Patient agreeable to participate in OT session. Reports no pain level. Patient required increased time for bed mobility due to fatigue. Patient participated in skilled OT session focusing on functional mobility, toileting, cognition. Patient completed toileting with SUP, toilet transfer SUP to CGA, handwashing CGA standing at sink. Patient then completed functional mobility to gym with no AE CGA to min due to sway. Patient completed cognitive tasks of visual perceptual, visual closure, figure ground, and form constancy to determine root cause of deficit. Patient able to complete tasks with max A/ max verbal cues due to deficits in ideamotor, ideational apraxia, figure ground, visual closure, and overall motor planning/ sequencing. Patient required rest breaks due to frustration with activities. Patient returned to room via functional mobility with all needs in reach alarm and telesitter on.    Therapy Documentation Precautions:  Precautions Precautions: Fall Restrictions Weight Bearing Restrictions Per Provider Order: No  Therapy/Group: Individual Therapy  D'mariea L [name] 09/14/2024, 3:40 PM

## 2024-09-15 DIAGNOSIS — F09 Unspecified mental disorder due to known physiological condition: Secondary | ICD-10-CM

## 2024-09-15 MED ORDER — ENSURE PLUS HIGH PROTEIN PO LIQD
237.0000 mL | Freq: Two times a day (BID) | ORAL | Status: DC
  Administered 2024-09-15 – 2024-09-21 (×9): 237 mL via ORAL

## 2024-09-15 NOTE — Progress Notes (Signed)
 Occupational Therapy Session Note  Patient Details  Name: Rhonda Cortez MRN: [id number] Date of Birth: 1961/10/11  Today's Date: 09/15/2024 OT Individual Time: 1101-1200 OT Individual Time Calculation (min): 59 min    Short Term Goals: Week 1:  OT Short Term Goal 1 (Week 1): pt will be able to ambulate to bathroom with RW with min A. OT Short Term Goal 2 (Week 1): Pt will be able to don all LB clothing with supervision. OT Short Term Goal 3 (Week 1): Pt will be able to hold balance safely on toilet and toilet with Supervision. OT Short Term Goal 4 (Week 1): Pt will be able to stand at sink for grooming tasks with CGA.  Skilled Therapeutic Interventions/Progress Updates:  Patient agreeable to participate in OT session. Reports 0/10 pain level. Patient received in bed with complaints of increased fatigue. Patient completed functional mobility approx 200  ft to main gym. Patient completed bird dog exercise with increased verbal cues required for sequencing utilization of alternate sides placed in quadruped. Patient able to complete 3x with reps of 5. Patient then completed supine dead bugs (2x5) utilizing alternating arms and legs to increase coordination and cognitive skills. Patient required increased verbal cues for word finding and sequencing of dual objective tasks. Patient then completed visual scanning and sequencing task on BITS with number sequencing and drawing line from one number to the next with min to mod verbal cues. Patient then returned to room via ambulation CGA to SUP 300+ ft with alarm on all needs in reach, telesitter present.     Therapy Documentation Precautions:  Precautions Precautions: Fall Restrictions Weight Bearing Restrictions Per Provider Order: No  Therapy/Group: Individual Therapy  D'mariea L Shanera Meske 09/15/2024, 7:27 AM

## 2024-09-15 NOTE — Progress Notes (Signed)
 PROGRESS NOTE   Subjective/Complaints:  No issues overnite  ROS: Limited due to cognitive/behavioral   Objective:   No results found.  Recent Labs    09/13/24 0501  WBC 8.4  HGB 13.0  HCT 38.7  PLT 464*   Recent Labs    09/13/24 0501  NA 138  K 4.0  CL 106  CO2 22  GLUCOSE 107*  BUN 23  CREATININE 1.76*  CALCIUM  9.2    Intake/Output Summary (Last 24 hours) at 09/15/2024 1007 Last data filed at 09/14/2024 1821 Gross per 24 hour  Intake 480 ml  Output --  Net 480 ml         Physical Exam: Vital Signs Blood pressure 97/65, pulse 83, temperature 98.2 F (36.8 C), resp. rate 18, height 5' 3 (1.6 m), weight 61.4 kg, SpO2 99%.    General: No acute distress Mood and affect are appropriate Heart: Regular rate and rhythm no rubs murmurs or extra sounds Lungs: Clear to auscultation, breathing unlabored, no rales or wheezes Abdomen: Positive bowel sounds, soft nontender to palpation, nondistended Extremities: No clubbing, cyanosis, or edema Skin: No evidence of breakdown, no evidence of rash Neurologic: Cranial nerves II through XII intact, motor strength is 5/5 in bilateral deltoid, bicep, tricep, grip, hip flexor, knee extensors, ankle dorsiflexor and plantar flexor Oriented to person and place not time  Cerebellar exam normal finger to nose to finger as well as heel to shin in bilateral upper and lower extremities Musculoskeletal: Full range of motion in all 4 extremities. No joint swelling  Neuro: Pt alert. Word finding and processing delays. Persistent right sided facial droop. Speech sl dysarthric. Mild right HP with decreased FMC . Sl decrease in right sided sensation of light touch. Musc: Full ROM, No pain with AROM or PROM in the neck, trunk, or extremities. Posture appropriate    Assessment/Plan: 1. Functional deficits which require 3+ hours per day of interdisciplinary therapy in a comprehensive  inpatient rehab setting. Physiatrist is providing close team supervision and 24 hour management of active medical problems listed below. Physiatrist and rehab team continue to assess barriers to discharge/monitor patient progress toward functional and medical goals  Care Tool:  Bathing    Body parts bathed by patient: Right arm, Left arm, Chest, Abdomen, Front perineal area, Buttocks, Right upper leg, Left upper leg, Right lower leg, Left lower leg, Face         Bathing assist Assist Level: Contact Guard/Touching assist     Upper Body Dressing/Undressing Upper body dressing   What is the patient wearing?: Pull over shirt    Upper body assist Assist Level: Supervision/Verbal cueing    Lower Body Dressing/Undressing Lower body dressing      What is the patient wearing?: Underwear/pull up, Pants     Lower body assist Assist for lower body dressing: Minimal Assistance - Patient > 75%     Toileting Toileting    Toileting assist Assist for toileting: Supervision/Verbal cueing     Transfers Chair/bed transfer  Transfers assist     Chair/bed transfer assist level: Minimal Assistance - Patient > 75%     Locomotion Ambulation   Ambulation assist  Assist level: Moderate Assistance - Patient 50 - 74% Assistive device: No Device Max distance: 50'   Walk 10 feet activity   Assist     Assist level: Moderate Assistance - Patient - 50 - 74% Assistive device: No Device   Walk 50 feet activity   Assist    Assist level: Moderate Assistance - Patient - 50 - 74% Assistive device: No Device    Walk 150 feet activity   Assist Walk 150 feet activity did not occur: Safety/medical concerns (endurnace)         Walk 10 feet on uneven surface  activity   Assist     Assist level: Minimal Assistance - Patient > 75% Assistive device: Walker-rolling   Wheelchair     Assist Is the patient using a wheelchair?: Yes Type of Wheelchair: Manual     Wheelchair assist level: Dependent - Patient 0%      Wheelchair 50 feet with 2 turns activity    Assist        Assist Level: Dependent - Patient 0%   Wheelchair 150 feet activity     Assist      Assist Level: Dependent - Patient 0%   Blood pressure 97/65, pulse 83, temperature 98.2 F (36.8 C), resp. rate 18, height 5' 3 (1.6 m), weight 61.4 kg, SpO2 99%.   Medical Problem List and Plan: 1. 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  Antithrombotics: -DVT/anticoagulation:  Mechanical: Sequential compression devices, below knee Bilateral lower extremities  -dopplers reviewed and negative Pharmaceutical: Heparin              -antiplatelet therapy: continue Aspirin  81 mg --No DAPT due to recent ICH   3. Pain Management:con't tylenol  as needed Add Topirimate for HA    4. Mood/Behavior/Sleep: LCSW to follow for evaluation and support when available.              -antipsychotic agents: N/A              -sleep patterns better   5. Neuropsych/cognition: This patient is not capable of making decisions on her own behalf.   6. Skin/Wound Care: Routine pressure relief measures.    7. AKI on CKD: finished IVF       Latest Ref Rng & Units 09/13/2024    5:01 AM 09/11/2024    5:06 AM 09/10/2024    6:08 AM  BMP  Glucose 70 - 99 mg/dL 892  889  892   BUN 8 - 23 mg/dL 23  23  23    Creatinine 0.44 - 1.00 mg/dL 8.23  8.06  8.27   Sodium 135 - 145 mmol/L 138  135  136   Potassium 3.5 - 5.1 mmol/L 4.0  4.0  4.1   Chloride 98 - 111 mmol/L 106  104  107   CO2 22 - 32 mmol/L 22  20  18    Calcium  8.9 - 10.3 mg/dL 9.2  9.1  8.6   87/8 Improving, enc po  9. Hx recent ICH:  continue  Lipitor 40 mg daily    10. Hx stroke/TIA: Stroke 2014-no significant residual deficit. Continue statin.    11. Seizure: Episode on 11/22, EEG negative for seizure activity. Loaded with Keppra , now on Vimpat  50 mg bid.    12. 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 Avoid low BP-Longterm BP goal 130-150 systolic given severe stenosis/occlusion of left MI and P1.              -  orthostatics improved. Continue to monitor    13.Hx of HTN: continue home regime- Norvac and Valsartan .    not on ACE-I, will reduce  14. HLD: was on Pitavastatin  at home, continue Atorvastatin  40 mg daily.    15.  Fever/leukocytosis leukocytosis resolved  -Repeat Chest xray/urinalysis/Blood cultures and venous doppler and CT abd/pelvis d/t recent ICH.     11/27- CT- went over with pt/family- looks OK except constipation and subcentimeter hypodensity in liver  12/1 WBCs normal    -UCX with diptheroids-d/c keflex  , has completed 5 d course   -Blood cx NGTD 3days       Latest Ref Rng & Units 09/13/2024    5:01 AM 09/11/2024    5:06 AM 09/10/2024    6:08 AM  CBC  WBC 4.0 - 10.5 K/uL 8.4  9.5  12.7   Hemoglobin 12.0 - 15.0 g/dL 86.9  86.5  87.9   Hematocrit 36.0 - 46.0 % 38.7  39.2  35.0   Platelets 150 - 400 K/uL 464  407  372     16. Tachycardia improved on Toprol  XL but BPs on low side will reduce amlodipine  and check Ortho vitals    Vitals:   09/14/24 1951 09/15/24 0431  BP: 110/72 97/65  Pulse: 82 83  Resp: 16 18  Temp: 98.3 F (36.8 C) 98.2 F (36.8 C)  SpO2: 99% 99%  Reduced amlodipine  to 5mg  starting 12/2 will monitor effect   18. Constipation  11/27- LBM after sorbitol --needs to eat more!  11/30 add senna-s bid         LOS: 7 days A FACE TO FACE EVALUATION WAS PERFORMED  Prentice FORBES Compton 09/15/2024, 10:07 AM

## 2024-09-15 NOTE — Progress Notes (Signed)
 Physical Therapy Session Note  Patient Details  Name: Rhonda Cortez MRN: [id number] Date of Birth: [birth year]  Today's Date: 09/15/2024 PT Individual Time: 0900-0930 PT Individual Time Calculation (min): 30 min   Short Term Goals: Week 1:  PT Short Term Goal 1 (Week 1): Pt will perform transfers with CGA PT Short Term Goal 2 (Week 1): Pt will demonstrate dynamic standing balance with CGA PT Short Term Goal 3 (Week 1): Pt will perform gait x 150' with CGA PT Short Term Goal 4 (Week 1): Pt will perform stairs with CGA  Skilled Therapeutic Interventions/Progress Updates: Pt presented in bed agreeable to therapy. Pt denies pain during session. Session focused on gait and duel task activities. Pt completed bed mobility with supervision and ambulated with CGA to day room. Pt noted to have shortened uneven step length, decreased foot clearance, and mild inattention when entering day room nearly walking into door on L side. In day room PTA obtained x 5 objects (cone, cup, blue bean bag, horseshoe, green clothespin). Pt was unable to name objects independently but was able to name cone and horseshoe when give x 2 choices. PTA then placed objects around room and asked pt to locate item identified by therapist. Pt was unable to locate independently however when PTA asked for pt to locate xxx item pt was able to locate 3/5. Pt then ambulated back to room with noted gait in same manner as prior and in room left in w/c due to next session <5 min. Pt left in w/c with belt alarm on, call bell within reach and needs met.      Therapy Documentation Precautions:  Precautions Precautions: Fall Restrictions Weight Bearing Restrictions Per Provider Order: No General:   Vital Signs:   Pain: Pain Assessment Pain Scale: 0-10 Pain Score: 0-No pain   Therapy/Group: Individual Therapy  Vontrell Pullman 09/15/2024, 10:21 AM

## 2024-09-15 NOTE — Progress Notes (Signed)
 Physical Therapy Note  Patient Details  Name: Rhonda Cortez MRN: [id number] Date of Birth: 06-17-1961 Today's Date: 09/15/2024    Physical Therapist participated in the interdisciplinary team conference, providing clinical information regarding the patient's current status, treatment goals, and weekly focus, including any barriers that need to be addressed. Please see the Inpatient Rehabilitation Team Conference and Plan of Care Update for further details.    [name] [name] 09/15/2024, 10:16 AM

## 2024-09-15 NOTE — Progress Notes (Signed)
 Occupational Therapy Session Note  Patient Details  Name: Rhonda Cortez MRN: [id number] Date of Birth: 02-23-1961  Today's Date: 09/15/2024 OT Individual Time: 9069-9042 OT Individual Time Calculation (min): 27 min    Short Term Goals: Week 1:  OT Short Term Goal 1 (Week 1): pt will be able to ambulate to bathroom with RW with min A. OT Short Term Goal 2 (Week 1): Pt will be able to don all LB clothing with supervision. OT Short Term Goal 3 (Week 1): Pt will be able to hold balance safely on toilet and toilet with Supervision. OT Short Term Goal 4 (Week 1): Pt will be able to stand at sink for grooming tasks with CGA. Week 2:     Skilled Therapeutic Interventions/Progress Updates:    1:1 Pt received in chair. Engaged in functional ambulation without device from her room to the dayroom with focus on upright posture and faster speed to decr fall risk. At beginning of session was contact guard to min A but when walking back to her room only supported with close supervision with verbal cues for directions.  In the gym performed transitioning from standing to tall kneeling to mat to returning to standing without UE support with contact guard with extra time. Performed multiple times. Then practice walking and picking items off the floor with extra time with close supervision. Pt's balance and balance reactions continue to improve.    Pt got back into bed and lay down with distant supervision. Pt left with 4 rails up with seizure pads with call bell.   Therapy Documentation Precautions:  Precautions Precautions: Fall Restrictions Weight Bearing Restrictions Per Provider Order: No  Pain: No reports of pain in session    Therapy/Group: Individual Therapy  Claudene Nest [facility] 09/15/2024, 2:13 PM

## 2024-09-15 NOTE — Progress Notes (Signed)
 Physical Therapy Session Note  Patient Details  Name: Rhonda Cortez MRN: [id number] Date of Birth: Jul 13, 1961  Today's Date: 09/15/2024 PT Individual Time: 1505-1530 PT Individual Time Calculation (min): 25 min   Short Term Goals: Week 1:  PT Short Term Goal 1 (Week 1): Pt will perform transfers with CGA PT Short Term Goal 2 (Week 1): Pt will demonstrate dynamic standing balance with CGA PT Short Term Goal 3 (Week 1): Pt will perform gait x 150' with CGA PT Short Term Goal 4 (Week 1): Pt will perform stairs with CGA  Skilled Therapeutic Interventions/Progress Updates:    Pt presents in room in bed, asleep but awakens slowly and agreeable to PT. Pt does not report pain during session. Session focused on therapeutic activities to facilitate participation with self care tasks and transfers as well as NMR for BUE/BLE coordination, dual tasking, dynamic standing balance, and attention to task. Pt completes bed mobility with supervision, completes transfers with supervision throughout session. Pt ambulates room<>gym without device with CGA, min cues for attention to obstacles on R side, demonstrating x2 LOB with ambulating to gym with scissoring noted, pt able to catch self with CGA, no LOB with return to room. Pt completes NMR in sitting and standing with self ball toss including: - seated ball toss two hand toss/catch x20 and toss between BUEs x20 - standing ball toss between BUEs x20, two hand toss/catch x20 (increased difficulty switching back to toss/catch with both hands) - marching two hand toss/catch x20, unable to sequencing completing marching with tossing between BUEs requiring increased time for initiation and processing, thearpist providing verbal/visual cues. Pt ambulates back to room and requests to use restroom, completes transfer and 3/3 toileting tasks with supervision. Pt completes hand hygiene with supervision with cues for sequencing. Pt returns to bed with supervision and remains  semi reclined with all needs within reach, call light in place, and bed alarm activated at end of session.  Therapy Documentation Precautions:  Precautions Precautions: Fall Restrictions Weight Bearing Restrictions Per Provider Order: No    Therapy/Group: Individual Therapy  Reche Ohara PT, DPT 09/15/2024, 5:00 PM

## 2024-09-15 NOTE — Patient Care Conference (Signed)
 Inpatient RehabilitationTeam Conference and Plan of Care Update Date: 09/15/2024   Time: 10:15 AM   Patient was admitted after the interdisciplinary team meeting took place on 09/08/2024; as a result, the first team conference occurred on day 8 of this patient's stay.    Patient Name: Rhonda Cortez      Medical Record Number: [id number]  Date of Birth: [birth year] Sex: Female         Room/Bed: 4W05C/4W05C-01 Payor Info: Payor: [employer]ULAN / Plan: [facility] / Product Type: *No Product type* /    Admit Date/Time:  09/08/2024  3:41 PM  Primary Diagnosis:  Acute ischemic left PCA stroke [facility])  Hospital Problems: Principal Problem:   Acute ischemic left PCA stroke [facility])    Expected Discharge Date: Expected Discharge Date: 09/22/24  Team Members Present: Physician leading conference: Dr. Prentice Compton Social Worker Present: Rhoda Clement, LCSW Nurse Present: Barnie Ronde, RN PT Present: Sherlean Perks, PT OT Present: Delon Sharps, OT SLP Present: Blaise Alderman, SLP     Current Status/Progress Goal Weekly Team Focus  Bowel/Bladder   Patint is continent of bowels and bladder. Lat bowel movement was 12/2, patient stool is formed. Patient has been having regular, adequate urine output, no urinary symptoms noted.   Normal regular urine and bowel elimination.   Bowel and bladder elimination patterns    Swallow/Nutrition/ Hydration               ADL's   close S to CGA with mobility and self care,  very delayed processing,  perceptual impairments with R side awareness and spatial relations   mod ind toileting, supervision toilet transfers, LB dressing, bathing; min A light housekeeping   cognitive and perceptual training, ADL training with balance,  pt/family education    Mobility   Bed mobility = supervision; Transfers = supervision/CGA with RW; Ambulation = CGA (min cues for safe obstacle navigation). Stairs = minA   supervision overall  Barriers: poor carryover of  cues; Focus = gait, obstacle navigation, stepping strategies, speed of movement, NMRE, memory recall/motor planning    Communication   mod-maxA   minA   confrontational naming, automatic speech, responsive naming, receptive naming    Safety/Cognition/ Behavioral Observations  mod-maxA   min   orientation/attention    Pain   Patient denies any pain symptoms   Patient remains pain free   Pain goal objectives are met    Skin   Patient skin is clean, dry and intact   Rgular skin assessment and skin care  Patient remains wounds free      Discharge Planning:  Home with family coming in to assist-made aware will need 24/7 supervision for safety and needing cues. Await team's recommendations and set up family education   Team Discussion: Patient post left PCA CVA with ? Seizure (EEG negative) and orthostasis with aphasia, anomia, delayed processing, balance issues and poor spatial awareness without focal weakness.  Patient on target to meet rehab goals: yes, currently needs close supervision - CGA for ADLs. Requires cues to stay on task due to lack of recall and poor awareness. Appears apathetic to therapy.  Automatic speech is fine however struggles with structured speech. Needs supervision for ambulation using a RW.  *See Care Plan and progress notes for long and short-term goals.   Revisions to Treatment Plan:  Discontinued tele-sitter   Teaching Needs: Safety, medications, transfers, toileting, etc.   Current Barriers to Discharge: Decreased caregiver support and Behavior  Possible Resolutions to Barriers:  Family education     Medical Summary Current Status: IPH plus seizure, safety awareness improvinmg, Aphasia , Right visuospatial deficits  Barriers to Discharge: Medical stability;Behavior/Mood;Uncontrolled Pain   Possible Resolutions to [employer]: Topamax started for post CVA HA, safety issues expected at D/C   Continued Need for Acute  Rehabilitation Level of Care: The patient requires daily medical management by a physician with specialized training in physical medicine and rehabilitation for the following reasons: Direction of a multidisciplinary physical rehabilitation program to maximize functional independence : Yes Medical management of patient stability for increased activity during participation in an intensive rehabilitation regime.: Yes Analysis of laboratory values and/or radiology reports with any subsequent need for medication adjustment and/or medical intervention. : Yes   I attest that I was present, lead the team conference, and concur with the assessment and plan of the team.   Fredericka Sober B 09/15/2024, 1:49 PM

## 2024-09-15 NOTE — Progress Notes (Signed)
 Patient ID: Rhonda Cortez, female   DOB: Jul 19, 1961, 63 y.o.   MRN: [id number] Met with pt and spoke with Irish-sister via telephone to inform of team conference with goals of supervision level due to processing and need for cues, also for safety. Our target discharge date is 12/11. Irish will talk with the family and get back with this worker to schedule family training in preparation for discharge. She will call back tomorrow with day and tome they can come for education. Discussed with pt going to OP versus home health due to doing well mobility wise and being able to be challenged more. Await return call from sister

## 2024-09-15 NOTE — Progress Notes (Signed)
 Occupational Therapy participated in the interdisciplinary team conference, providing clinical information regarding the patient's current status, treatment goals, and weekly focus, including any barriers that need to be addressed. Please see the Inpatient Rehabilitation Team Conference and Plan of Care Update for further details.

## 2024-09-15 NOTE — Progress Notes (Signed)
 Speech Language Pathology Daily Session Note  Patient Details  Name: Rhonda Cortez MRN: [id number] Date of Birth: 07-03-1961  Today's Date: 09/15/2024 SLP Individual Time: 0808-0900 SLP Individual Time Calculation (min): 52 min  Short Term Goals: Week 1: SLP Short Term Goal 1 (Week 1): Patient will demonstrate sustained attention to functional tasks with mod multimodal A SLP Short Term Goal 2 (Week 1): Patient will verbally communicate wants/needs given mod multimodal A SLP Short Term Goal 3 (Week 1): Patient will answer complex yes/no questions with 90% accuracy given mod multimodal A SLP Short Term Goal 4 (Week 1): Patient will name functional items with 60% accuracy given mod multimodal A  Skilled Therapeutic Interventions:   Pt greeted at bedside for tx targeting communication. During initial conversation, SLP facilitated calendar review and education re upcoming team conference. She required modA for automatic sentence completion task to prime for additional naming task. She then completed a responsive naming task w/ maxA. She continues to remain most stimulable to sentence completion cues d/t reduced initiation of verbal responses. She completed a written task copying first and last name. She spontaneously copied tami of first name but required maxA to copy last name. She then completed a written expression task completing opposites. She verbalized them independently, but required maxA to complete the final 2-3 letters of intended word. At the end of structured tasks, she was provided w/ additional education re aphasia and stroke recovery. She was left in bed w/ the alarm set and call light within reach. Telesitter in view as well. Recommend cont ST per POC.   Pain Pain Assessment Pain Scale: 0-10 Pain Score: 0-No pain  Therapy/Group: Individual Therapy  Recardo DELENA Mole 09/15/2024, 8:51 AM

## 2024-09-15 NOTE — Consult Note (Signed)
 Neuropsychological Consultation Comprehensive Inpatient Rehab   Patient:   Rhonda Cortez   DOB:   1961/04/24  MR Number:  [id number]  [facility]  [facility] [address] [facility] [address] [place] [address] Dept: [phone] Loc: [phone]           Date of Service:   09/15/2024  Start Time:   1 PM End Time:   2 PM  Provider/Observer:  Norleen Asa, Psy.D.       Clinical Neuropsychologist       Billing Code/Service: [id number]  Reason for Service:    Rhonda Cortez is a 63 year old female referred for neuropsychological consultation during her ongoing admission to the comprehensive inpatient rehabilitation unit.  Patient has had complex cerebrovascular events including hemorrhagic bleeding as well as occlusive stroke.  Referral is to evaluate current cognitive status, particularly severe confusion and disorientation, to inform rehabilitation strategies and prognosis.    Presenting Concerns:  Reports from sister indicate acute onset of confusion. Prior to admission, client was found to be disoriented and confabulating, providing multiple contradictory accounts for a seven-hour period where her whereabouts were unknown. This confusion has persisted since hospital admission.    Relevant Clinical History:  - Neurological: History of CVA (2014). Recent admission on 08/27/2024 for acute left caudate intracranial hemorrhage with intraventricular extension. Hospital course complicated by subsequent acute infarcts in the left PCA and adjacent MCA/PCA watershed territories on 09/04/2024. EEG on 09/06/2024 showed cortical dysfunction from the left temporal region without epileptiform activity. Imaging revealed severe stenosis of left MCA (M1/M2), high-grade stenosis of right PCA (P2/P3), and moderate mid-basilar artery stenosis.  - Psychiatric: No known history.  - Medical: PMHx: hypertension, hyperlipidemia. Recent hospital course included  acute kidney injury and fever of unknown origin. Medications include aspirin  (restarted post-infarct), Keppra , and Vimpat . Previously on aspirin  post-2014 CVA, but was discontinued prior to recent events. VTE prophylaxis with SCDs.    Functional Capacity:  Prior to admission, was fully independent with all ADLs, working, and driving. Lives alone in a single-level home. Currently requires contact-guard to minimal assistance for mobility.    Mental State Examination:  - Appearance and Behaviour: Appeared cooperative. No significant motor restlessness or agitation noted.  - Speech: Spontaneous but perseverative at times, repeating September even when being asked what city she was in. Rate and volume were extremely low volume and slowed speech patterns.  - Mood and Affect: Affect appeared congruent with situation, though insight was limited.  - Thought Process/Content: Thought process is disorganized and tangential. Appears to be confabulating to explain memory gaps. No evidence of psychosis.  - Cognition: Severely disoriented to year (stated 2000), month (stated September), and place (unable to name city). Unable to correctly identify the current month (December) or season (winter) despite cues. Perseverated on the word September. Attention appears impaired. Recent memory deficits are profound.  - Insight and Judgement: Insight is poor. Appears unaware of the extent of her cognitive deficits and the severity of her current medical situation.    Clinical Impressions:  Presents with significant global cognitive dysfunction, most prominent in domains of orientation, executive function, and memory, consistent with recent multifocal cerebrovascular events (hemorrhagic and ischemic). The severe disorientation and confabulation are primary features. Engagement was adequate, but performance was limited by profound confusion.     Recommendations and Next Steps:  - Continue participation in comprehensive  inpatient rehabilitation therapies (PT/OT/SLP) to stimulate cognitive recovery.   - Provide psychoeducation to  patient and family regarding the nature of the brain injuries and the expected course of recovery, emphasizing that improvement is anticipated but will take time.  - Will continue to follow as part of the rehabilitation team, with reassessment of cognitive status planned for next week.  - Family encouraged to alert the team if emotional distress begins to interfere with therapy participation.   Electronically Signed   _______________________ Norleen Asa, Psy.D. Clinical Neuropsychologist

## 2024-09-16 LAB — BASIC METABOLIC PANEL WITH GFR
Anion gap: 9 (ref 5–15)
BUN: 25 mg/dL — ABNORMAL HIGH (ref 8–23)
CO2: 25 mmol/L (ref 22–32)
Calcium: 9.5 mg/dL (ref 8.9–10.3)
Chloride: 105 mmol/L (ref 98–111)
Creatinine, Ser: 2.27 mg/dL — ABNORMAL HIGH (ref 0.44–1.00)
GFR, Estimated: 24 mL/min — ABNORMAL LOW (ref >60)
Glucose, Bld: 109 mg/dL — ABNORMAL HIGH (ref 70–99)
Potassium: 4.7 mmol/L (ref 3.5–5.1)
Sodium: 139 mmol/L (ref 135–145)

## 2024-09-16 LAB — CBC WITH DIFFERENTIAL/PLATELET
Abs Immature Granulocytes: 0.03 K/uL (ref 0.00–0.07)
Basophils Absolute: 0 K/uL (ref 0.0–0.1)
Basophils Relative: 0 %
Eosinophils Absolute: 0.2 K/uL (ref 0.0–0.5)
Eosinophils Relative: 2 %
HCT: 39.6 % (ref 36.0–46.0)
Hemoglobin: 13.2 g/dL (ref 12.0–15.0)
Immature Granulocytes: 0 %
Lymphocytes Relative: 42 %
Lymphs Abs: 3.2 K/uL (ref 0.7–4.0)
MCH: 29.9 pg (ref 26.0–34.0)
MCHC: 33.3 g/dL (ref 30.0–36.0)
MCV: 89.6 fL (ref 80.0–100.0)
Monocytes Absolute: 0.6 K/uL (ref 0.1–1.0)
Monocytes Relative: 8 %
Neutro Abs: 3.6 K/uL (ref 1.7–7.7)
Neutrophils Relative %: 48 %
Platelets: 488 K/uL — ABNORMAL HIGH (ref 150–400)
RBC: 4.42 MIL/uL (ref 3.87–5.11)
RDW: 12.9 % (ref 11.5–15.5)
WBC: 7.7 K/uL (ref 4.0–10.5)
nRBC: 0 % (ref 0.0–0.2)

## 2024-09-16 MED ORDER — SODIUM CHLORIDE 0.45 % IV SOLN: INTRAVENOUS | Status: DC

## 2024-09-16 NOTE — Progress Notes (Signed)
 PROGRESS NOTE   Subjective/Complaints: Amb longer distances with PT,OT No issues overnite  Reviewed labs  ROS: Limited due to cognitive/behavioral   Objective:   No results found.  Recent Labs    09/16/24 0522  WBC 7.7  HGB 13.2  HCT 39.6  PLT 488*   Recent Labs    09/16/24 0522  NA 139  K 4.7  CL 105  CO2 25  GLUCOSE 109*  BUN 25*  CREATININE 2.27*  CALCIUM  9.5    Intake/Output Summary (Last 24 hours) at 09/16/2024 0840 Last data filed at 09/15/2024 2053 Gross per 24 hour  Intake 360 ml  Output --  Net 360 ml         Physical Exam: Vital Signs Blood pressure 125/82, pulse 94, temperature 98 F (36.7 C), temperature source Oral, resp. rate 18, height 5' 3 (1.6 m), weight 60.1 kg, SpO2 100%.    General: No acute distress Mood and affect are appropriate Heart: Regular rate and rhythm no rubs murmurs or extra sounds Lungs: Clear to auscultation, breathing unlabored, no rales or wheezes Abdomen: Positive bowel sounds, soft nontender to palpation, nondistended Extremities: No clubbing, cyanosis, or edema Skin: No evidence of breakdown, no evidence of rash Neurologic: Cranial nerves II through XII intact, motor strength is 5/5 in bilateral deltoid, bicep, tricep, grip, hip flexor, knee extensors, ankle dorsiflexor and plantar flexor Oriented to person and hospital but not city  Musculoskeletal: Full range of motion in all 4 extremities. No joint swelling  Neuro: Pt alert. Word finding and processing delays. Persistent right sided facial droop. Speech sl dysarthric. Mild right HP with decreased FMC . Sl decrease in right sided sensation of light touch. Musc: Full ROM, No pain with AROM or PROM in the neck, trunk, or extremities. Posture appropriate    Assessment/Plan: 1. Functional deficits which require 3+ hours per day of interdisciplinary therapy in a comprehensive inpatient rehab  setting. Physiatrist is providing close team supervision and 24 hour management of active medical problems listed below. Physiatrist and rehab team continue to assess barriers to discharge/monitor patient progress toward functional and medical goals  Care Tool:  Bathing    Body parts bathed by patient: Right arm, Left arm, Chest, Abdomen, Front perineal area, Buttocks, Right upper leg, Left upper leg, Right lower leg, Left lower leg, Face         Bathing assist Assist Level: Contact Guard/Touching assist     Upper Body Dressing/Undressing Upper body dressing   What is the patient wearing?: Pull over shirt    Upper body assist Assist Level: Supervision/Verbal cueing    Lower Body Dressing/Undressing Lower body dressing      What is the patient wearing?: Underwear/pull up, Pants     Lower body assist Assist for lower body dressing: Minimal Assistance - Patient > 75%     Toileting Toileting    Toileting assist Assist for toileting: Supervision/Verbal cueing     Transfers Chair/bed transfer  Transfers assist     Chair/bed transfer assist level: Minimal Assistance - Patient > 75%     Locomotion Ambulation   Ambulation assist      Assist level: Moderate Assistance - Patient  50 - 74% Assistive device: No Device Max distance: 50'   Walk 10 feet activity   Assist     Assist level: Moderate Assistance - Patient - 50 - 74% Assistive device: No Device   Walk 50 feet activity   Assist    Assist level: Moderate Assistance - Patient - 50 - 74% Assistive device: No Device    Walk 150 feet activity   Assist Walk 150 feet activity did not occur: Safety/medical concerns (endurnace)         Walk 10 feet on uneven surface  activity   Assist     Assist level: Minimal Assistance - Patient > 75% Assistive device: Walker-rolling   Wheelchair     Assist Is the patient using a wheelchair?: Yes Type of Wheelchair: Manual    Wheelchair  assist level: Dependent - Patient 0%      Wheelchair 50 feet with 2 turns activity    Assist        Assist Level: Dependent - Patient 0%   Wheelchair 150 feet activity     Assist      Assist Level: Dependent - Patient 0%   Blood pressure 125/82, pulse 94, temperature 98 F (36.7 C), temperature source Oral, resp. rate 18, height 5' 3 (1.6 m), weight 60.1 kg, SpO2 100%.   Medical Problem List and Plan: 1. 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  Antithrombotics: -DVT/anticoagulation:  Mechanical: Sequential compression devices, below knee Bilateral lower extremities  -dopplers reviewed and negative Pharmaceutical: Heparin              -antiplatelet therapy: continue Aspirin  81 mg --No DAPT due to recent ICH   3. Pain Management:con't tylenol  as needed Add Topirimate for HA    4. Mood/Behavior/Sleep: LCSW to follow for evaluation and support when available.              -antipsychotic agents: N/A              -sleep patterns better   5. Neuropsych/cognition: This patient is not capable of making decisions on her own behalf.   6. Skin/Wound Care: Routine pressure relief measures.    7. AKI on CKD: finished IVF       Latest Ref Rng & Units 09/16/2024    5:22 AM 09/13/2024    5:01 AM 09/11/2024    5:06 AM  BMP  Glucose 70 - 99 mg/dL 890  892  889   BUN 8 - 23 mg/dL 25  23  23    Creatinine 0.44 - 1.00 mg/dL 7.72  8.23  8.06   Sodium 135 - 145 mmol/L 139  138  135   Potassium 3.5 - 5.1 mmol/L 4.7  4.0  4.0   Chloride 98 - 111 mmol/L 105  106  104   CO2 22 - 32 mmol/L 25  22  20     Calcium  8.9 - 10.3 mg/dL 9.5  9.2  9.1   Worsening  again off IVF- oral intake only 500-6101mL per day will resume IVF   9. Hx recent ICH: continue Lipitor 40 mg daily    10. Hx stroke/TIA: Stroke 2014-no significant residual deficit. Continue statin.    11. Seizure: Episode on 11/22, EEG negative for seizure activity. Loaded with Keppra , now on Vimpat  50 mg bid.    12. 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 Avoid low BP-Longterm BP goal 130-150 systolic given severe stenosis/occlusion of left MI and P1.              -  orthostatics improved. Continue to monitor    13.Hx of HTN: continue home regime- Norvac and Valsartan .    not on ACE-I, will reduce  14. HLD: was on Pitavastatin  at home, continue Atorvastatin  40 mg daily.    15.  Fever/leukocytosis leukocytosis resolved  -Repeat Chest xray/urinalysis/Blood cultures and venous doppler and CT abd/pelvis d/t recent ICH.     11/27- CT- went over with pt/family- looks OK except constipation and subcentimeter hypodensity in liver  12/1 WBCs normal    -UCX with diptheroids-d/c keflex  , has completed 5 d course   -Blood cx NGTD 3days       Latest Ref Rng & Units 09/16/2024    5:22 AM 09/13/2024    5:01 AM 09/11/2024    5:06 AM  CBC  WBC 4.0 - 10.5 K/uL 7.7  8.4  9.5   Hemoglobin 12.0 - 15.0 g/dL 86.7  86.9  86.5   Hematocrit 36.0 - 46.0 % 39.6  38.7  39.2   Platelets 150 - 400 K/uL 488  464  407     16. Tachycardia improved on Toprol  XL but BPs on low side will reduce amlodipine  and check Ortho vitals    Vitals:   09/15/24 2137 09/16/24 0451  BP: 122/80 125/82  Pulse: 97 94  Resp: 18 18  Temp:  98 F (36.7 C)  SpO2: 100% 100%  Reduced amlodipine  to 5mg  starting 12/2 in desired range, HR still a little high but will not adjust toprol  at this time   18. Constipation  11/27- LBM after sorbitol --needs to eat more!  11/30 add senna-s bid         LOS: 8 days A  FACE TO FACE EVALUATION WAS PERFORMED  Prentice FORBES Compton 09/16/2024, 8:40 AM

## 2024-09-16 NOTE — Progress Notes (Signed)
 Physical Therapy Session Note  Patient Details  Name: Rhonda Cortez MRN: [id number] Date of Birth: 19-Nov-1960  Today's Date: 09/16/2024 PT Individual Time: 1020-1100 PT Individual Time Calculation (min): 40 min   Short Term Goals: Week 1:  PT Short Term Goal 1 (Week 1): Pt will perform transfers with CGA PT Short Term Goal 2 (Week 1): Pt will demonstrate dynamic standing balance with CGA PT Short Term Goal 3 (Week 1): Pt will perform gait x 150' with CGA PT Short Term Goal 4 (Week 1): Pt will perform stairs with CGA  Skilled Therapeutic Interventions/Progress Updates: Patient semi-reclined in bed on entrance to room. Patient alert and agreeable to PT session.   Patient reported no pain. Pt did not recall who this PTA was (has had multiple sessions prior).  Therapeutic Activity: Bed Mobility: Pt performed supine<>sit on EOB with supervision and HOB elevated. Transfers: Pt performed sit<>stand transfers throughout session with no AD and with close supervision/light CGA. Provided VC to ensure back of knees touch sitting surfaces.  - Pt navigated 12 (6) steps with B UE on railing and reciprocal ascending, and fluctuation between step to and reciprocal descending with cue to increase clearance of R LE to avoid heel sliding on edge of step. Pt required overall close supervision with no LOB.   Neuromuscular Re-ed: NMR facilitated during session with focus on coordination, dynamic standing balance, attention to task, dual-task. - Pt ambulated around nsg/day room loop x 2 with no AD and with overall CGA/very light minA for safety and cues to turn head and look in various directions. Pt required external target to look at to increase head rotation (PTA hand), otherwise pt required max cuing to fully rotate to R/L or up and down without changing cadence. Pt noted to have mild gait deviations but overall no LOB noted - Pt ambulated around day room loop x 2 while tossing up ball in air with overall CGA  and VC to increase awareness to R side to avoid bumping into obstacles. Pt also demonstrated reaching anteriorly outside BOS to catch ball with no LOB. - Step to tall cone with mostly CGA to tap with R, and closer to minA to tap with L LE due to increase in R lean with VC to perform stepping strategy to R to maintain standing balance (decreased reaction time made it difficult for pt to safely perform).   NMR performed for improvements in motor control and coordination, balance, sequencing, judgement, and self confidence/ efficacy in performing all aspects of mobility at highest level of independence.   Patient semi-reclined in bed at end of session with brakes locked, bed alarm set, and all needs within reach.      Therapy Documentation Precautions:  Precautions Precautions: Fall Restrictions Weight Bearing Restrictions Per Provider Order: No  Therapy/Group: Individual Therapy  Rain Wilhide PTA 09/16/2024, 12:23 PM

## 2024-09-16 NOTE — Progress Notes (Signed)
 Occupational Therapy Session Note  Patient Details  Name: Rhonda Cortez MRN: [id number] Date of Birth: 1961-09-20  Today's Date: 09/16/2024 OT Individual Time: 738-820   Short Term Goals: Week 1:  OT Short Term Goal 1 (Week 1): pt will be able to ambulate to bathroom with RW with min A. OT Short Term Goal 2 (Week 1): Pt will be able to don all LB clothing with supervision. OT Short Term Goal 3 (Week 1): Pt will be able to hold balance safely on toilet and toilet with Supervision. OT Short Term Goal 4 (Week 1): Pt will be able to stand at sink for grooming tasks with CGA.  Skilled Therapeutic Interventions/Progress Updates:  Pt greeted supine in bed, pt agreeable to OT intervention.      Transfers/bed mobility/functional mobility:  Pt completed supine>sit with supervision. Pt completed sit>stand from EOB with no AD and supervision.    ADLs:  UB dressing:pt donned OH shirt with set- up assist  LB dressing: pt donned pants and underwear with supervision but MIN cues for safety  Footwear: donned socks from EOB with set-up assist.   Bathing: pt completed bathing seated on TTB but needed initital MIN cues to initate task  Transfers: ambulatory toilet/shower transfer with no AD and supervision.  Toileting: continent urine void, pt completed 3/3 toileting tasks with supervision                 Ended session with pt seated EOB with nurse present.   Therapy Documentation Precautions:  Precautions Precautions: Fall Restrictions Weight Bearing Restrictions Per Provider Order: No  Pain: No pain    Therapy/Group: Individual Therapy  Ronal Gift [facility] 09/16/2024, 12:08 PM

## 2024-09-16 NOTE — Progress Notes (Signed)
 Occupational Therapy Session Note  Patient Details  Name: Rhonda Cortez MRN: [id number] Date of Birth: 06-02-1961  Today's Date: 09/16/2024 OT Individual Time:835-930 OT Individual Time Calculation (min): 55 min    Short Term Goals: Week 1:  OT Short Term Goal 1 (Week 1): pt will be able to ambulate to bathroom with RW with min A. OT Short Term Goal 2 (Week 1): Pt will be able to don all LB clothing with supervision. OT Short Term Goal 3 (Week 1): Pt will be able to hold balance safely on toilet and toilet with Supervision. OT Short Term Goal 4 (Week 1): Pt will be able to stand at sink for grooming tasks with CGA.  Skilled Therapeutic Interventions/Progress Updates:    Pt received sitting EOB with breakfast plate sitting open in front of her but pt not eating. Cued pt to try to take a few bites of her pancake.  Pt only took a few bites of banana and not initiating eating well. Pt stated she was not hungry.  Cued pt to try to brush teeth.  Pt stood at sink but needed max cues with all tasks of finding supplies on R side of sink, managing the water faucet,  noted perseveration with rinsing off toothbrush.  Pt continues to move slowly and needs significant time to process through tasks.  Pt ambulated slowly to ADL apt with close S/light CGA with no AD.  She tends to look down and needs cues to look ahead and take faster steps.   In apt, had pt look in cabinets for food items and types of dishes.  Minimal R inattention noted and pt did need mod cues to find certain items. Pt has great difficulty with naming objects, but is able to express how she if feeling with fair fluidity.    For balance challenge,  dropped pieces of stryrofoam cup on floor. Pt used broom to sweep up all pieces and pt able to bend over and sweep into dustpan and toss in trashcan.  No LOB or cues needed.   To work on speed of movement and pt's response time, used metronome at 100 bpm and cued pt to step at that rate. In apt,  practiced walking back and forth.  She had difficulty keeping up with the rhythm so had her hold my hand and keep up with my pace with metronome. Pt did well with this and even walked back to room (over 150 ft ) at faster pace of 100 bpm holding my hand guiding her. Pt did well and then started smiling much more and was even more activity.    Pt opted to sit on bed to rest before PT.  Alarm set and all needs met.         Therapy Documentation Precautions:  Precautions Precautions: Fall Restrictions Weight Bearing Restrictions Per Provider Order: No      Pain:  No c/o pain  ADL: ADL Eating: Set up Grooming: Supervision/safety Upper Body Bathing: Supervision/safety, Minimal cueing Where Assessed-Upper Body Bathing: Shower Lower Body Bathing: Supervision/safety, Minimal cueing Where Assessed-Lower Body Bathing: Shower Upper Body Dressing: Supervision/safety Where Assessed-Upper Body Dressing: Chair Lower Body Dressing: Supervision/safety Where Assessed-Lower Body Dressing: Chair Toileting: Supervision/safety Where Assessed-Toileting: Teacher, Adult Education: Furniture Conservator/restorer Method: Proofreader: Acupuncturist: Administrator, Arts Method: Designer, Industrial/product: Emergency planning/management officer, Grab bars   Therapy/Group: Individual Therapy  Frenchie Pribyl 09/16/2024, 12:15 PM

## 2024-09-16 NOTE — Progress Notes (Signed)
 Speech Language Pathology Daily Session Note  Patient Details  Name: Rhonda Cortez MRN: [id number] Date of Birth: 11-08-1960  Today's Date: 09/16/2024 SLP Individual Time: 1101-1200 SLP Individual Time Calculation (min): 59 min  Short Term Goals: Week 1: SLP Short Term Goal 1 (Week 1): Patient will demonstrate sustained attention to functional tasks with mod multimodal A SLP Short Term Goal 2 (Week 1): Patient will verbally communicate wants/needs given mod multimodal A SLP Short Term Goal 3 (Week 1): Patient will answer complex yes/no questions with 90% accuracy given mod multimodal A SLP Short Term Goal 4 (Week 1): Patient will name functional items with 60% accuracy given mod multimodal A  Skilled Therapeutic Interventions: Skilled therapy session focused on communication goals. SLP facilitated session by targeted automatic speech and writing tasks. Patient verbalized name, however required maxA and model to write. Patient verbalized numbers 1-10 given extra time, however required minA to write 1-3 and total A for remainder of numbers as she began to perseverate on #3. Patient verbalized days of the week independently and months of the year given initial word in sequence and modA due to perseverations. SLP then targeted expressive language through phrase completion task. Patient with 50% accuracy independently increased to 80% accuracy given maxA. Patient left in bed with alarm set and call bell in reach. Continue POC  Pain denies  Therapy/Group: Individual Therapy  Jamya Starry M.A., CCC-SLP 09/16/2024, 7:37 AM

## 2024-09-17 NOTE — Progress Notes (Signed)
 Speech Language Pathology Weekly Progress and Session Note  Patient Details  Name: Rhonda Cortez MRN: [id number] Date of Birth: [birth year]  Beginning of progress report period: September 10, 2024 End of progress report period: September 17, 2024  Today's Date: 09/17/2024 SLP Individual Time: 0800-0857 SLP Individual Time Calculation (min): 57 min  Short Term Goals: Week 1: SLP Short Term Goal 1 (Week 1): Patient will demonstrate sustained attention to functional tasks with mod multimodal A SLP Short Term Goal 1 - Progress (Week 1): Met SLP Short Term Goal 2 (Week 1): Patient will verbally communicate wants/needs given mod multimodal A SLP Short Term Goal 2 - Progress (Week 1): Not met SLP Short Term Goal 3 (Week 1): Patient will answer complex yes/no questions with 90% accuracy given mod multimodal A SLP Short Term Goal 3 - Progress (Week 1): Met SLP Short Term Goal 4 (Week 1): Patient will name functional items with 60% accuracy given mod multimodal A SLP Short Term Goal 4 - Progress (Week 1): Met    New Short Term Goals: Week 2: SLP Short Term Goal 1 (Week 2): STG = LTG due to ELOS  Weekly Progress Updates: Pt has made good gains and has met 3 of 4 STGs this reporting period due to improved sustained attention and communication. Currently, patient continues to require mod A for cognition and modA for confrontational naming/receptive language. Patient continues to require maxA to communicate complex wants/needs. Pt/family eduction ongoing. Pt would benefit from continued ST intervention to maximize cognition and communication in order to maximize functional independence at d/c.    Intensity: Minumum of 1-2 x/day, 30 to 90 minutes Frequency: 3 to 5 out of 7 days Duration/Length of Stay: 12/10 Treatment/Interventions: Cognitive remediation/compensation;Internal/external aids;Speech/Language facilitation;Therapeutic Activities;Cueing hierarchy;Functional tasks;Patient/family  education;Multimodal communication approach   Daily Session  Skilled Therapeutic Interventions:  Skilled therapy session focused on communication goals. SLP facilitated session by targeting automatic speech tasks. Patient verbalized and wrote name and numbers 1-10 independently this date! Patient verbalized days of the week and months of the year given initial word in sequence with no perseverations! These are significant improvements from prior. Patient oriented to self independently and location/situation given three choices. SLP targeted receptive language through simple and complex yes/no questions. Patient answered simple yes/no questions with 100% accuracy independently and complex with 90% with modA. SLP targeted expressive language through confrontational naming tasks. Patient with 60% accuracy given modA with occasional instances of perseveration. Patient continues to demonstrate increased difficulty at the conversational level to verbalize wants/needs and recall how to contact nursing. Patient left in bed with alarm set and call bell in reach. Continue POC      Pain Denies   Therapy/Group: Individual Therapy  Karlissa Aron M.A., CCC-SLP 09/17/2024, 8:53 AM

## 2024-09-17 NOTE — Progress Notes (Signed)
 PROGRESS NOTE   Subjective/Complaints: No new complaints, niece is at bedside she will be one of the caregivers.  We discussed the need for 24-hour supervision after patient leaves.  We discussed the patient's cognitive recovery will be taking months for physical recovery has been going quite well No further seizures more orthostatic events noted ROS: Limited due to cognitive/behavioral   Objective:   No results found.  Recent Labs    09/16/24 0522  WBC 7.7  HGB 13.2  HCT 39.6  PLT 488*   Recent Labs    09/16/24 0522  NA 139  K 4.7  CL 105  CO2 25  GLUCOSE 109*  BUN 25*  CREATININE 2.27*  CALCIUM  9.5    Intake/Output Summary (Last 24 hours) at 09/17/2024 0900 Last data filed at 09/17/2024 0300 Gross per 24 hour  Intake 1175 ml  Output --  Net 1175 ml         Physical Exam: Vital Signs Blood pressure 114/77, pulse 80, temperature 98.1 F (36.7 C), temperature source Oral, resp. rate 18, height 5' 3 (1.6 m), weight 61 kg, SpO2 100%.    General: No acute distress Mood and affect are appropriate Heart: Regular rate and rhythm no rubs murmurs or extra sounds Lungs: Clear to auscultation, breathing unlabored, no rales or wheezes Abdomen: Positive bowel sounds, soft nontender to palpation, nondistended Extremities: No clubbing, cyanosis, or edema Skin: No evidence of breakdown, no evidence of rash Neurologic: Cranial nerves II through XII intact, motor strength is 5/5 in bilateral deltoid, bicep, tricep, grip, hip flexor, knee extensors, ankle dorsiflexor and plantar flexor Oriented to person and hospital but not city  Musculoskeletal: Full range of motion in all 4 extremities. No joint swelling  Neuro: Pt alert. Word finding and processing delays.  Oriented to person, answer the correct day of the week but could not say the month of the year or place.  Speech sl dysarthric. Mild right HP with decreased FMC  . Musc: Full ROM, No pain with AROM or PROM in the neck, trunk, or extremities. Posture appropriate    Assessment/Plan: 1. Functional deficits which require 3+ hours per day of interdisciplinary therapy in a comprehensive inpatient rehab setting. Physiatrist is providing close team supervision and 24 hour management of active medical problems listed below. Physiatrist and rehab team continue to assess barriers to discharge/monitor patient progress toward functional and medical goals  Care Tool:  Bathing    Body parts bathed by patient: Right arm, Left arm, Chest, Abdomen, Front perineal area, Buttocks, Right upper leg, Left upper leg, Right lower leg, Left lower leg, Face         Bathing assist Assist Level: Contact Guard/Touching assist     Upper Body Dressing/Undressing Upper body dressing   What is the patient wearing?: Pull over shirt    Upper body assist Assist Level: Supervision/Verbal cueing    Lower Body Dressing/Undressing Lower body dressing      What is the patient wearing?: Underwear/pull up, Pants     Lower body assist Assist for lower body dressing: Minimal Assistance - Patient > 75%     Toileting Toileting    Toileting assist Assist for  toileting: Supervision/Verbal cueing     Transfers Chair/bed transfer  Transfers assist     Chair/bed transfer assist level: Minimal Assistance - Patient > 75%     Locomotion Ambulation   Ambulation assist      Assist level: Moderate Assistance - Patient 50 - 74% Assistive device: No Device Max distance: 50'   Walk 10 feet activity   Assist     Assist level: Moderate Assistance - Patient - 50 - 74% Assistive device: No Device   Walk 50 feet activity   Assist    Assist level: Moderate Assistance - Patient - 50 - 74% Assistive device: No Device    Walk 150 feet activity   Assist Walk 150 feet activity did not occur: Safety/medical concerns (endurnace)         Walk 10 feet on uneven  surface  activity   Assist     Assist level: Minimal Assistance - Patient > 75% Assistive device: Walker-rolling   Wheelchair     Assist Is the patient using a wheelchair?: Yes Type of Wheelchair: Manual    Wheelchair assist level: Dependent - Patient 0%      Wheelchair 50 feet with 2 turns activity    Assist        Assist Level: Dependent - Patient 0%   Wheelchair 150 feet activity     Assist      Assist Level: Dependent - Patient 0%   Blood pressure 114/77, pulse 80, temperature 98.1 F (36.7 C), temperature source Oral, resp. rate 18, height 5' 3 (1.6 m), weight 61 kg, SpO2 100%.   Medical Problem List and Plan: 1. 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  Antithrombotics: -DVT/anticoagulation:  Mechanical: Sequential compression devices, below knee Bilateral lower extremities  -dopplers reviewed and negative Pharmaceutical: Heparin  ambulation distance is greater than 100 feet will discontinue heparin              -antiplatelet therapy: continue Aspirin  81 mg --No DAPT due to recent ICH   3. Pain Management:con't tylenol  as needed Add Topirimate for HA    4. Mood/Behavior/Sleep: LCSW to follow for evaluation and support when available.              -antipsychotic agents: N/A              -sleep patterns better   5. Neuropsych/cognition: This patient is not capable of making decisions on her own behalf.   6. Skin/Wound Care: Routine pressure relief measures.    7. AKI on CKD: finished IVF       Latest Ref Rng & Units 09/16/2024    5:22 AM 09/13/2024    5:01 AM  09/11/2024    5:06 AM  BMP  Glucose 70 - 99 mg/dL 890  892  889   BUN 8 - 23 mg/dL 25  23  23    Creatinine 0.44 - 1.00 mg/dL 7.72  8.23  8.06   Sodium 135 -  145 mmol/L 139  138  135   Potassium 3.5 - 5.1 mmol/L 4.7  4.0  4.0   Chloride 98 - 111 mmol/L 105  106  104   CO2 22 - 32 mmol/L 25  22  20    Calcium  8.9 - 10.3 mg/dL 9.5  9.2  9.1   Worsening again off IVF- oral intake only 500-600mL per day will resume IVF, repeat metabolic package on Monday, reviewed medication list no nephrotoxic medications identified.  9. Hx recent ICH: continue Lipitor 40 mg daily    10. Hx stroke/TIA: Stroke 2014-no significant residual deficit. Continue statin.    11. Seizure: Episode on 11/22, EEG negative for seizure activity. Loaded with Keppra , now on Vimpat  50 mg bid.    12. 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 Avoid low BP-Longterm BP goal 130-150 systolic given severe stenosis/occlusion of left MI and P1.              -  orthostatics improved. Continue to monitor    13.Hx of HTN: continue home regime- Norvac and Valsartan .    not on ACE-I, will reduce  14. HLD: was on Pitavastatin  at home, continue Atorvastatin  40 mg daily.    15.  Fever/leukocytosis leukocytosis resolved  -Repeat Chest xray/urinalysis/Blood cultures and venous doppler and CT abd/pelvis d/t recent ICH.     11/27- CT- went over with pt/family- looks OK except constipation and subcentimeter hypodensity in liver  12/1 WBCs normal    -UCX with diptheroids-d/c keflex  , has completed 5 d course   -Blood cx NGTD 3days       Latest Ref Rng & Units 09/16/2024    5:22 AM 09/13/2024    5:01 AM 09/11/2024    5:06 AM  CBC  WBC 4.0 - 10.5 K/uL 7.7  8.4  9.5   Hemoglobin 12.0 - 15.0 g/dL 86.7  86.9  86.5   Hematocrit 36.0 - 46.0 % 39.6  38.7  39.2   Platelets 150 - 400 K/uL 488  464  407     16. Tachycardia improved on Toprol  XL but BPs on low side will reduce amlodipine   and check Ortho vitals    Vitals:   09/16/24 2018 09/17/24 0547  BP: 111/69 114/77  Pulse: 91 80  Resp: 18 18  Temp: 98.5 F (36.9 C) 98.1 F (36.7 C)  SpO2: 100% 100%  Reduced amlodipine  to 5mg  starting 12/2 in desired range, HR still a little high but will not adjust toprol  at this time   18. Constipation  11/27- LBM after sorbitol --needs to eat more!  11/30 add senna-s bid         LOS: 9 days A FACE TO FACE EVALUATION WAS PERFORMED  Prentice FORBES Compton 09/17/2024, 9:00 AM

## 2024-09-17 NOTE — Plan of Care (Signed)
  Problem: Consults Goal: RH STROKE PATIENT EDUCATION Description: See Patient Education module for education specifics  Outcome: Not Progressing   Problem: RH KNOWLEDGE DEFICIT Goal: RH STG INCREASE KNOWLEDGE OF STROKE PROPHYLAXIS Description: Patient and sister will be able to manage secondary risks using educational resources for medications an dietary modification independently Outcome: Not Progressing

## 2024-09-17 NOTE — Progress Notes (Signed)
 Physical Therapy Session Note  Patient Details  Name: Rhonda Cortez MRN: [id number] Date of Birth: November 04, 1960  Today's Date: 09/17/2024 PT Individual Time: 9078-9053; 1449 - 1530 PT Individual Time Calculation (min): 25 min; 41 min   Short Term Goals: Week 1:  PT Short Term Goal 1 (Week 1): Pt will perform transfers with CGA PT Short Term Goal 2 (Week 1): Pt will demonstrate dynamic standing balance with CGA PT Short Term Goal 3 (Week 1): Pt will perform gait x 150' with CGA PT Short Term Goal 4 (Week 1): Pt will perform stairs with CGA  SESSION 1 Skilled Therapeutic Interventions/Progress Updates: Patient semi-reclined in bed with niece present following rounds with attending physician on entrance to room. Patient alert and agreeable to PT session.   Patient reported no pain during session.   Therapeutic Activity: Bed Mobility: Pt performed supine<>sit on EOB modI (HOB slightly elevated). Transfers: Pt performed sit<>stand transfers throughout session without AD and with supervision for safety.  TUG (avg = 18.30) with close supervision and no AD. VC required to recall sit when back at chair vs attempting second lap each round. Pt has improved ambulatory balance. Pt with decreased BOS when pivoting which led to slight LOB when turning to the L but able to maintain standing balance with CGA for safety.  - 19.14s    - 18.43s   - 17.34s  Neuromuscular Re-ed: NMR facilitated during session with focus on R sided attention. - Pt ambulated from room<>main gym without AD and with overall supervision/light CGA and cues to increase R sided awareness to avoid bumping into objects on R.  NMR performed for improvements in motor control and coordination, balance, sequencing, judgement, and self confidence/ efficacy in performing all aspects of mobility at highest level of independence.   Patient semi-reclined in bed at end of session with brakes locked, bed alarm set, and all needs within  reach.  SESSION 2 Skilled Therapeutic Interventions/Progress Updates: Patient semi-reclined on entrance to room. Patient alert and agreeable to PT session.   Patient reported no pain  Therapeutic Activity: Bed Mobility: Pt performed semi-reclined<>sit on EOB with mod. Transfers: Pt performed sit<>stand transfers throughout session with supervision and no AD for safety. Pt required VC to increase step clearance/length when pivoting (presents with shuffle-like pattern).  Neuromuscular Re-ed: NMR facilitated during session with focus on dynamic standing, coordination, R side awareness. - Pt ambulated around nsg/day room loop without AD and with cues to look in various direction while maintaining cadence (required visual target of PTA hand). Pt with mild gait deviations and changes to gait speed and decreased step clearance when pivoting - Pt pivoted around bolster (set up vertically) with cues to maintain leading LE is slightly flexed more forward vs following LE, and to increase internal rotation of leading LE around bolster. Pt required overall close supervision and added cue to increase step clearance. 2 rounds performed till close to fatigue with rest break provided.   NMR performed for improvements in motor control and coordination, balance, sequencing, judgement, and self confidence/ efficacy in performing all aspects of mobility at highest level of independence.   Therapeutic Exercise: Pt performed the following exercises with therapist providing the described cuing and facilitation for improvement. - NuStep 0.3 miles; 507 steps; 48 avg spm; 1.5 METs on level 3 resistance for 10.5 minutes in order to increase cardiovascular endurance  Patient semi-reclined in bed at end of session with brakes locked, bed alarm set, and all needs within reach.  Therapy Documentation Precautions:  Precautions Precautions: Fall Restrictions Weight Bearing Restrictions Per Provider Order:  No  Therapy/Group: Individual Therapy  Martavius Lusty PTA 09/17/2024, 12:25 PM

## 2024-09-17 NOTE — Progress Notes (Signed)
 Patient ID: Rhonda Cortez, female   DOB: 1961-02-17, 63 y.o.   MRN: [id number]  Spoke with Irish-sister via telephone to schedule family training. Will be here monday from 10-12 and 1-2 to see all three therapies with pt in preparation for discharge 12/10

## 2024-09-17 NOTE — Progress Notes (Signed)
 Occupational Therapy Weekly Progress Note  Patient Details  Name: Rhonda Cortez MRN: [id number] Date of Birth: 1961/06/04  Beginning of progress report period: September 10, 2024 End of progress report period: September 17, 2024  Today's Date: 09/17/2024 OT Individual Time: 1050-1205 OT Individual Time Calculation (min): 75 min    Patient has met 4 of 4 short term goals.  Pt has been making excellent progress with her activity tolerance, balance and coordination.  She continues to have severe cognitive deficits that are impacted by her aphasia.  She is improving with completing familiar tasks related to her self care.   She Is needing max cues with new unfamiliar tasks.    Patient continues to demonstrate the following deficits: decreased coordination, decreased visual perceptual skills and field cut, decreased attention to right, decreased initiation, decreased attention, decreased awareness, decreased problem solving, decreased safety awareness, decreased memory, and delayed processing, and decreased sitting balance, decreased standing balance, and decreased balance strategies and therefore will continue to benefit from skilled OT intervention to enhance overall performance with BADL.  Patient progressing toward long term goals..  Continue plan of care.  OT Short Term Goals Week 1:  OT Short Term Goal 1 (Week 1): pt will be able to ambulate to bathroom with RW with min A. OT Short Term Goal 1 - Progress (Week 1): Met OT Short Term Goal 2 (Week 1): Pt will be able to don all LB clothing with supervision. OT Short Term Goal 2 - Progress (Week 1): Met OT Short Term Goal 3 (Week 1): Pt will be able to hold balance safely on toilet and toilet with Supervision. OT Short Term Goal 3 - Progress (Week 1): Met OT Short Term Goal 4 (Week 1): Pt will be able to stand at sink for grooming tasks with CGA. OT Short Term Goal 4 - Progress (Week 1): Met Week 2:  OT Short Term Goal 1 (Week 2): STGs =  LTGs  Skilled Therapeutic Interventions/Progress Updates:    Pt received in bed sound asleep.  It took her a few minutes to wake up.  Pt did get out of bed and declined need to toilet.  Agreeable to brushing teeth at sink.  Pt stood at sink with set up only and completed oral care with no cues needed!  Pt taken to dance group ambulating with S to gym.  With 1:1 tactile and visual cues from this therapist.  Pt sat for most of the group working on following the instructors cues for the various arm and foot movements.  I often needed to help her initiate the movement and then pt able to follow through.   Pt smiling the entire time and truly seemed to enjoy the activity.  She even stood to dance for one song with mod cues from this therapist for side step tap dance.    The activity focused on arm coordination and endurance, following directions, attention to task, and social engagement with the other participants.  Pt ambulated back to room with her niece present. Pt sat EOB, alarm on and all needs met.   Therapy Documentation Precautions:  Precautions Precautions: Fall Restrictions Weight Bearing Restrictions Per Provider Order: No   Pain: Pain Assessment Pain Score: 0-No pain ADL: ADL Eating: Set up Grooming: Supervision/safety Upper Body Bathing: Supervision/safety, Minimal cueing Where Assessed-Upper Body Bathing: Shower Lower Body Bathing: Supervision/safety, Minimal cueing Where Assessed-Lower Body Bathing: Shower Upper Body Dressing: Supervision/safety Where Assessed-Upper Body Dressing: Chair Lower Body Dressing: Supervision/safety Where Assessed-Lower  Body Dressing: Chair Toileting: Supervision/safety Where Assessed-Toileting: Teacher, Adult Education: Furniture Conservator/restorer Method: Proofreader: Acupuncturist: Administrator, Arts Method: Designer, Industrial/product: Emergency planning/management officer, Grab bars       Therapy/Group: Individual Therapy  Aragon Scarantino 09/17/2024, 12:52 PM

## 2024-09-17 NOTE — Plan of Care (Signed)
 Goals downgraded to reflect current progress and current d/c date Problem: RH Cognition - SLP Goal: RH LTG Patient will demonstrate orientation with cues Description:  LTG:  Patient will demonstrate orientation to person/place/time/situation with cues (SLP)   Flowsheets (Taken 09/17/2024 0855) LTG: Patient will demonstrate orientation using cueing (SLP): Moderate Assistance - Patient 50 - 74%   Problem: RH Expression Communication Goal: LTG Patient will express needs/wants via multi-modal(SLP) Description: LTG:  Patient will express needs/wants via multi-modal communication (gestures/written, etc) with cues (SLP) Flowsheets (Taken 09/17/2024 0855) LTG: Patient will express needs/wants via multimodal communication (gestures/written, etc) with cueing (SLP): Moderate Assistance - Patient 50 - 74% Goal: LTG Patient will increase word finding of common (SLP) Description: LTG:  Patient will increase word finding of common objects/daily info/abstract thoughts with cues using compensatory strategies (SLP). Flowsheets Taken 09/17/2024 9144 Patient will use compensatory strategies to increase word finding of: Common objects Taken 09/10/2024 1351 LTG: Patient will increase word finding of common (SLP): Minimal Assistance - Patient > 75%   Problem: RH Attention Goal: LTG Patient will demonstrate this level of attention during functional activites (SLP) Description: LTG:  Patient will will demonstrate this level of attention during functional activites (SLP) Flowsheets Taken 09/17/2024 0855 Patient will demonstrate during cognitive/linguistic activities the attention type of: Sustained Taken 09/10/2024 1351 LTG: Patient will demonstrate this level of attention during cognitive/linguistic activities with assistance of (SLP): Minimal Assistance - Patient > 75%

## 2024-09-17 NOTE — Progress Notes (Signed)
 Physical Therapy Weekly Progress Note  Patient Details  Name: Rhonda Cortez MRN: [id number] Date of Birth: April 19, 1961  Beginning of progress report period: September 10, 2024 End of progress report period: September 17, 2024  Patient has met 4 of 4 short term goals. Pt is making functional progress towards LTG's of overall supervision. Pt ambulates without AD with close supervision due to mild imbalances and decreased R sided attention. Pt performs stairs and transfers with supervision and is improving pivot step clearance/length to safely turn 180*.  Family education has been scheduled 12/8. Pt continues to require overall supervision due to decreased memory recall, processing and problem solving.   Patient continues to demonstrate the following deficits {impairments:3041632} and therefore will continue to benefit from skilled PT intervention to increase functional independence with mobility.  Patient {LTG progression:3041653}.  {plan of rjmz:6958345}  PT Short Term Goals Week 1:  PT Short Term Goal 1 (Week 1): Pt will perform transfers with CGA PT Short Term Goal 1 - Progress (Week 1): Met PT Short Term Goal 2 (Week 1): Pt will demonstrate dynamic standing balance with CGA PT Short Term Goal 2 - Progress (Week 1): Met PT Short Term Goal 3 (Week 1): Pt will perform gait x 150' with CGA PT Short Term Goal 3 - Progress (Week 1): Met PT Short Term Goal 4 (Week 1): Pt will perform stairs with CGA PT Short Term Goal 4 - Progress (Week 1): Met  Skilled Therapeutic Interventions/Progress Updates:      Therapy Documentation Precautions:  Precautions Precautions: Fall Restrictions Weight Bearing Restrictions Per Provider Order: No  Machell Wirthlin PTA  09/17/2024, 3:51 PM

## 2024-09-18 MED ORDER — POLYETHYLENE GLYCOL 3350 17 G PO PACK
17.0000 g | PACK | Freq: Every day | ORAL | Status: DC
  Administered 2024-09-18 – 2024-09-22 (×5): 17 g via ORAL
  Filled 2024-09-18 (×5): qty 1

## 2024-09-18 NOTE — Progress Notes (Signed)
 PROGRESS NOTE   Subjective/Complaints:  No events overnight.  Patient has no complaints today Vitals stable      09/18/2024    5:12 AM 09/17/2024    7:51 PM 09/17/2024    2:00 PM  Vitals with BMI  Systolic 129 116 899  Diastolic 82 77 60  Pulse 80 86 83    No results for input(s): GLUCAP in the last 72 hours.   P.o. intakes appropriate   Continent of bladder   Last BM 12/1--patient says last adequate bowel movement was before that; denies any abdominal pain or nausea   ROS: Limited due to cognitive/behavioral   Objective:   No results found.  Recent Labs    09/16/24 0522  WBC 7.7  HGB 13.2  HCT 39.6  PLT 488*   Recent Labs    09/16/24 0522  NA 139  K 4.7  CL 105  CO2 25  GLUCOSE 109*  BUN 25*  CREATININE 2.27*  CALCIUM  9.5    Intake/Output Summary (Last 24 hours) at 09/18/2024 1235 Last data filed at 09/18/2024 0900 Gross per 24 hour  Intake 720 ml  Output --  Net 720 ml         Physical Exam: Vital Signs Blood pressure 129/82, pulse 80, temperature 98.6 F (37 C), temperature source Oral, resp. rate 16, height 5' 3 (1.6 m), weight 61 kg, SpO2 99%.    General: No acute distress Mood and affect are appropriate Heart: Regular rate and rhythm no rubs murmurs or extra sounds Lungs: Clear to auscultation, breathing unlabored, no rales or wheezes Abdomen: Positive bowel sounds, soft nontender to palpation, nondistended Extremities: No clubbing, cyanosis, or edema Skin: No evidence of breakdown, no evidence of rash Neurologic: Cranial nerves II through XII intact, motor strength is 5/5 in bilateral deltoid, bicep, tricep, grip, hip flexor, knee extensors, ankle dorsiflexor and plantar flexor Oriented to person and hospital but not city  Musculoskeletal: Full range of motion in all 4 extremities. No joint swelling  Neuro: Pt alert. Word finding and processing delays.  Oriented to person,  answer the correct day of the week but could not say the month of the year or place.  Speech sl dysarthric. Mild right HP with decreased FMC . Musc: Full ROM, No pain with AROM or PROM in the neck, trunk, or extremities. Posture appropriate   Physical exam unchanged from the above on reexamination 09/18/24    Assessment/Plan: 1. Functional deficits which require 3+ hours per day of interdisciplinary therapy in a comprehensive inpatient rehab setting. Physiatrist is providing close team supervision and 24 hour management of active medical problems listed below. Physiatrist and rehab team continue to assess barriers to discharge/monitor patient progress toward functional and medical goals  Care Tool:  Bathing    Body parts bathed by patient: Right arm, Left arm, Chest, Abdomen, Front perineal area, Buttocks, Right upper leg, Left upper leg, Right lower leg, Left lower leg, Face         Bathing assist Assist Level: Contact Guard/Touching assist     Upper Body Dressing/Undressing Upper body dressing   What is the patient wearing?: Pull over shirt    Upper body  assist Assist Level: Supervision/Verbal cueing    Lower Body Dressing/Undressing Lower body dressing      What is the patient wearing?: Underwear/pull up, Pants     Lower body assist Assist for lower body dressing: Minimal Assistance - Patient > 75%     Toileting Toileting    Toileting assist Assist for toileting: Supervision/Verbal cueing     Transfers Chair/bed transfer  Transfers assist     Chair/bed transfer assist level: Minimal Assistance - Patient > 75%     Locomotion Ambulation   Ambulation assist      Assist level: Moderate Assistance - Patient 50 - 74% Assistive device: No Device Max distance: 50'   Walk 10 feet activity   Assist     Assist level: Moderate Assistance - Patient - 50 - 74% Assistive device: No Device   Walk 50 feet activity   Assist    Assist level: Moderate  Assistance - Patient - 50 - 74% Assistive device: No Device    Walk 150 feet activity   Assist Walk 150 feet activity did not occur: Safety/medical concerns (endurnace)         Walk 10 feet on uneven surface  activity   Assist     Assist level: Minimal Assistance - Patient > 75% Assistive device: Walker-rolling   Wheelchair     Assist Is the patient using a wheelchair?: Yes Type of Wheelchair: Manual    Wheelchair assist level: Dependent - Patient 0%      Wheelchair 50 feet with 2 turns activity    Assist        Assist Level: Dependent - Patient 0%   Wheelchair 150 feet activity     Assist      Assist Level: Dependent - Patient 0%   Blood pressure 129/82, pulse 80, temperature 98.6 F (37 C), temperature source Oral, resp. rate 16, height 5' 3 (1.6 m), weight 61 kg, SpO2 99%.   Medical Problem List and Plan: 1. 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  Antithrombotics: -DVT/anticoagulation:  Mechanical: Sequential compression devices, below knee Bilateral lower extremities  -dopplers reviewed and negative Pharmaceutical: Heparin  ambulation distance is greater than 100 feet will discontinue heparin              -antiplatelet therapy: continue Aspirin  81 mg --No DAPT due to recent ICH   3. Pain Management:con't tylenol  as needed Add Topirimate for HA    4. Mood/Behavior/Sleep: LCSW to follow for evaluation and support when available.              -antipsychotic agents: N/A              -sleep patterns better   5. Neuropsych/cognition: This patient is not capable of  making decisions on her own behalf.   6. Skin/Wound Care: Routine pressure relief measures.    7. AKI on CKD: finished IVF       Latest Ref Rng & Units 09/16/2024  5:22 AM 09/13/2024    5:01 AM 09/11/2024    5:06 AM  BMP  Glucose 70 - 99 mg/dL 890  892  889   BUN 8 - 23 mg/dL 25  23  23    Creatinine 0.44 - 1.00 mg/dL 7.72  8.23  8.06   Sodium 135 - 145 mmol/L 139  138  135   Potassium 3.5 - 5.1 mmol/L 4.7  4.0  4.0   Chloride 98 - 111 mmol/L 105  106  104   CO2 22 - 32 mmol/L 25  22  20    Calcium  8.9 - 10.3 mg/dL 9.5  9.2  9.1   Worsening again off IVF- oral intake only 500-631mL per day will resume IVF, repeat metabolic package on Monday, reviewed medication list no nephrotoxic medications identified.  9. Hx recent ICH: continue Lipitor 40 mg daily    10. Hx stroke/TIA: Stroke 2014-no significant residual deficit. Continue statin.    11. Seizure: Episode on 11/22, EEG negative for seizure activity. Loaded with Keppra , now on Vimpat  50 mg bid.    12. 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 Avoid low BP-Longterm BP goal 130-150 systolic given severe stenosis/occlusion of left MI and P1.              -  orthostatics improved. Continue to monitor    13.Hx of HTN: continue home regime- Norvac and Valsartan .    not on ACE-I, will reduce  14. HLD: was on Pitavastatin  at home, continue Atorvastatin  40 mg daily.    15.  Fever/leukocytosis leukocytosis resolved  -Repeat Chest xray/urinalysis/Blood cultures and venous doppler and CT abd/pelvis d/t recent ICH.     11/27- CT- went over with pt/family- looks OK except constipation and subcentimeter hypodensity in liver  12/1 WBCs normal    -UCX with diptheroids-d/c keflex  , has completed 5 d course   -Blood cx NGTD 3days       Latest Ref Rng & Units 09/16/2024    5:22 AM 09/13/2024    5:01 AM 09/11/2024    5:06 AM  CBC  WBC 4.0 - 10.5 K/uL 7.7  8.4  9.5   Hemoglobin 12.0  - 15.0 g/dL 86.7  86.9  86.5   Hematocrit 36.0 - 46.0 % 39.6  38.7  39.2   Platelets 150 - 400 K/uL 488  464  407     16. Tachycardia improved on Toprol  XL but BPs on low side will reduce amlodipine  and check Ortho vitals    Vitals:   09/17/24 1951 09/18/24 0512  BP: 116/77 129/82  Pulse: 86 80  Resp: 16 16  Temp: 98.5 F (36.9 C) 98.6 F (37 C)  SpO2: 100% 99%  Reduced amlodipine  to 5mg  starting 12/2 in desired range, HR still a little high but will not adjust toprol  at this time    - 12/6: normotensive   18. Constipation  11/27- LBM after sorbitol --needs to eat more!  11/30 add senna-s bid   LBM 12/1 - PO intakes appear OK - add miralax  daily        LOS: 10 days A FACE TO FACE EVALUATION WAS PERFORMED  Joesph JAYSON Likes 09/18/2024, 12:35 PM

## 2024-09-18 NOTE — Progress Notes (Signed)
 Physical Therapy Session Note  Patient Details  Name: Rhonda Cortez MRN: [id number] Date of Birth: 1960/11/08  Today's Date: 09/18/2024 PT Individual Time: 0906-1016 PT Individual Time Calculation (min): 70 min   Short Term Goals: Week 1:  PT Short Term Goal 1 (Week 1): Pt will perform transfers with CGA PT Short Term Goal 1 - Progress (Week 1): Met PT Short Term Goal 2 (Week 1): Pt will demonstrate dynamic standing balance with CGA PT Short Term Goal 2 - Progress (Week 1): Met PT Short Term Goal 3 (Week 1): Pt will perform gait x 150' with CGA PT Short Term Goal 3 - Progress (Week 1): Met PT Short Term Goal 4 (Week 1): Pt will perform stairs with CGA PT Short Term Goal 4 - Progress (Week 1): Met Week 2:     Skilled Therapeutic Interventions/Progress Updates: Patient supine in bed on entrance to room. Patient alert and agreeable to PT session.   Patient reported no pain. Pt had IV laying in bed with pt stating she took it out because she needed a shower (no shower reported). PTA cleaned area with alcohol wipe and donned bandage.  Pt a/o to self only Stated it was 63 Did not know location or reason for being at inpatient  Therapeutic Activity: Bed Mobility: Pt performed supine<>sit on EOB with modI.  Transfers: Pt performed sit<>stand transfers throughout session with supervision and no AD. Pt with improved step clearance when pivoting 180* to sit  Five times Sit to Stand Test (FTSS) Method: Use a straight back chair with a solid seat that is 16-18" high. Ask participant to sit on the chair with arms folded across their chest.   Instructions: "Stand up and sit down as quickly as possible 5 times, keeping your arms folded across your chest."   Measurement: Stop timing when the participant stands the 5th time.   TIME: avg = 16.77 (in seconds) - 16.98s - 17.08s - 16.24s   Times > 13.6 seconds is associated with increased disability and morbidity (Guralnik, 2000) Times > 15  seconds is predictive of recurrent falls in healthy individuals aged 18 and older (Buatois, et al., 2008) Normal performance values in community dwelling individuals aged 65 and older (Bohannon, 2006): 60-69 years: 11.4 seconds 70-79 years: 12.6 seconds 80-89 years: 14.8 seconds   MCID: >= 2.3 seconds for Vestibular Disorders (Meretta, 2006)  Neuromuscular Re-ed: - Pt  cued to ambulated around day room and locate cones to work on attention to simple task (no order required). Pt required cues to ambulate around room while scanning to better find cones - Pt ambulated around day room and cued to place cone of the same color in hand to ones scattered amongst room. Initially, pt had difficulty accurately naming colors (would call a color color or another name of color). Pt able to locate same colored cones and accurately place by visually tracking. - Pt ambulated around nsg/day room loop and bounced ball in B UE with overall close supervision and cues to maintain awareness to R side to avoid bumping into objects. Pt then cued to bounce ball on wall and to laterally walk to the R then L. Pt started off with tossing to wall and letting it bounce then catching it, then tossing to wall and catching it in the air. Pt performed with light CGA/close supervision with few times of needing to reach out of BOS but able to maintain dynamic standing balance.  - Pt performed pivot steps with close supervision  and demonstrative cues to increase R step clearance + external rotation vs pivoting on heel. Pt initially crossing L LE over R (pivot to R) and cued to avoid this as this increases risk of fall. Pt improved pivot steps but required max multimodal cuing to flex R hip to bring foot off of ground vs pivoting on heel.  NMR performed for improvements in motor control and coordination, balance, sequencing, judgement, and self confidence/ efficacy in performing all aspects of mobility at highest level of independence.    Patient sitting in WC at end of session with brakes locked, nsg present, chair alarm set, and all needs within reach.      Therapy Documentation Precautions:  Precautions Precautions: Fall Restrictions Weight Bearing Restrictions Per Provider Order: No  Therapy/Group: Individual Therapy  [place] Code PTA 09/18/2024, 11:58 AM

## 2024-09-19 LAB — BASIC METABOLIC PANEL WITH GFR
Anion gap: 8 (ref 5–15)
BUN: 24 mg/dL — ABNORMAL HIGH (ref 8–23)
CO2: 22 mmol/L (ref 22–32)
Calcium: 9 mg/dL (ref 8.9–10.3)
Chloride: 109 mmol/L (ref 98–111)
Creatinine, Ser: 2.03 mg/dL — ABNORMAL HIGH (ref 0.44–1.00)
GFR, Estimated: 27 mL/min — ABNORMAL LOW (ref >60)
Glucose, Bld: 106 mg/dL — ABNORMAL HIGH (ref 70–99)
Potassium: 3.9 mmol/L (ref 3.5–5.1)
Sodium: 139 mmol/L (ref 135–145)

## 2024-09-19 MED ORDER — SORBITOL 70 % SOLN
30.0000 mL | Freq: Once | Status: AC
  Administered 2024-09-19: 30 mL via ORAL
  Filled 2024-09-19: qty 30

## 2024-09-19 NOTE — Progress Notes (Addendum)
 PROGRESS NOTE   Subjective/Complaints:  No events overnight.  Patient has no complaints today.  Per nursing, consistently pulls at her IV sites at night and will refuse maintenance fluids.  Vitals stable      09/19/2024    6:02 AM 09/19/2024    4:19 AM 09/18/2024    7:58 PM  Vitals with BMI  Weight 138 lbs 14 oz    BMI 24.61    Systolic  108 138  Diastolic  60 85  Pulse  75 81    No results for input(s): GLUCAP in the last 72 hours.   P.o. intakes appropriate   Last BM 12/1--patient without discomfort, continues to eat adequately.   ROS: Limited due to cognitive/behavioral   Objective:   No results found.  No results for input(s): WBC, HGB, HCT, PLT in the last 72 hours.  No results for input(s): NA, K, CL, CO2, GLUCOSE, BUN, CREATININE, CALCIUM  in the last 72 hours.   Intake/Output Summary (Last 24 hours) at 09/19/2024 1451 Last data filed at 09/19/2024 1300 Gross per 24 hour  Intake 720 ml  Output --  Net 720 ml         Physical Exam: Vital Signs Blood pressure 108/60, pulse 75, temperature 98.4 F (36.9 C), temperature source Oral, resp. rate 18, height 5' 3 (1.6 m), weight 63 kg, SpO2 100%.    General: No acute distress.  Sitting upright in bed. Mood and affect are appropriate Heart: Regular rate and rhythm no rubs murmurs or extra sounds Lungs: Clear to auscultation, breathing unlabored, no rales or wheezes Abdomen: Positive bowel sounds, soft nontender to palpation, nondistended Extremities: No clubbing, cyanosis, or edema Skin: No evidence of breakdown, no evidence of rash Neurologic:  Awake, alert, oriented to self and place; not to time. + Cognitive delay, perseveration, intermittent confusion + Language: Dysarthria mild, hypophonic Cranial nerves II through XII intact, motor strength is 5/5 in bilateral deltoid, bicep, tricep, grip, hip flexor, knee extensors,  ankle dorsiflexor and plantar flexor Oriented to person and hospital but not city  Musculoskeletal: Full range of motion in all 4 extremities. No joint swelling    Assessment/Plan: 1. Functional deficits which require 3+ hours per day of interdisciplinary therapy in a comprehensive inpatient rehab setting. Physiatrist is providing close team supervision and 24 hour management of active medical problems listed below. Physiatrist and rehab team continue to assess barriers to discharge/monitor patient progress toward functional and medical goals  Care Tool:  Bathing    Body parts bathed by patient: Right arm, Left arm, Chest, Abdomen, Front perineal area, Buttocks, Right upper leg, Left upper leg, Right lower leg, Left lower leg, Face         Bathing assist Assist Level: Contact Guard/Touching assist     Upper Body Dressing/Undressing Upper body dressing   What is the patient wearing?: Pull over shirt    Upper body assist Assist Level: Supervision/Verbal cueing    Lower Body Dressing/Undressing Lower body dressing      What is the patient wearing?: Underwear/pull up, Pants     Lower body assist Assist for lower body dressing: Minimal Assistance - Patient > 75%  Toileting Toileting    Toileting assist Assist for toileting: Supervision/Verbal cueing     Transfers Chair/bed transfer  Transfers assist     Chair/bed transfer assist level: Supervision/Verbal cueing     Locomotion Ambulation   Ambulation assist      Assist level: Supervision/Verbal cueing Assistive device: No Device Max distance: 150   Walk 10 feet activity   Assist     Assist level: Supervision/Verbal cueing Assistive device: No Device   Walk 50 feet activity   Assist    Assist level: Supervision/Verbal cueing Assistive device: No Device    Walk 150 feet activity   Assist Walk 150 feet activity did not occur: Safety/medical concerns (endurnace)  Assist level:  Supervision/Verbal cueing Assistive device: No Device    Walk 10 feet on uneven surface  activity   Assist     Assist level: Supervision/Verbal cueing Assistive device: Other (comment) (no device)   Wheelchair     Assist Is the patient using a wheelchair?: Yes Type of Wheelchair: Manual    Wheelchair assist level: Dependent - Patient 0%      Wheelchair 50 feet with 2 turns activity    Assist        Assist Level: Dependent - Patient 0%   Wheelchair 150 feet activity     Assist      Assist Level: Dependent - Patient 0%   Blood pressure 108/60, pulse 75, temperature 98.4 F (36.9 C), temperature source Oral, resp. rate 18, height 5' 3 (1.6 m), weight 63 kg, SpO2 100%.   Medical Problem List and Plan: 1. 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  Antithrombotics: -DVT/anticoagulation:  Mechanical: Sequential compression devices, below knee Bilateral lower extremities  -dopplers reviewed and negative Pharmaceutical: Heparin  ambulation distance is greater than 100 feet will discontinue heparin              -antiplatelet therapy: continue Aspirin  81 mg --No DAPT due to recent ICH   3. Pain Management:con't tylenol  as needed Add Topirimate for HA    4. Mood/Behavior/Sleep: LCSW to follow for evaluation and support when available.              -antipsychotic agents: N/A              -sleep patterns better   5. Neuropsych/cognition: This patient is not capable of making decisions on her own behalf.   6. Skin/Wound Care: Routine pressure relief measures.    7. AKI on CKD:  finished IVF. Baseline Cr 1.6-1.9.        Latest Ref Rng & Units 09/16/2024    5:22 AM 09/13/2024    5:01 AM 09/11/2024    5:06 AM  BMP  Glucose 70 - 99 mg/dL 890  892  889   BUN 8 - 23 mg/dL 25  23  23    Creatinine 0.44 - 1.00 mg/dL 7.72  8.23  8.06   Sodium 135 - 145 mmol/L 139  138  135   Potassium 3.5 - 5.1 mmol/L 4.7  4.0  4.0   Chloride 98 - 111 mmol/L 105  106  104   CO2 22 - 32 mmol/L 25  22  20    Calcium  8.9 - 10.3 mg/dL 9.5  9.2  9.1   Worsening again off IVF- oral intake only 500-600mL per day will resume IVF, repeat metabolic package on Monday, reviewed medication list no nephrotoxic medications identified.  12-7: Per nursing, consistently removes IVs and will refuse overnight IV fluids.  Has been several days since last BMP, with significant increase in creatinine at that time; will repeat today---stable;  may need midline if continued need for maintenance fluids  9. Hx recent ICH: continue Lipitor 40 mg daily    10. Hx stroke/TIA: Stroke 2014-no significant residual deficit. Continue statin.    11. Seizure: Episode on 11/22, EEG negative for seizure activity. Loaded with Keppra , now on Vimpat  50 mg bid.    12. 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 Avoid low BP-Longterm BP goal 130-150 systolic given severe stenosis/occlusion of left MI and P1.              -  orthostatics improved. Continue to monitor    13.Hx of HTN: continue home regime- Norvac and Valsartan .    not on ACE-I, will reduce  14. HLD: was on Pitavastatin  at home, continue Atorvastatin  40 mg daily.    15.  Fever/leukocytosis leukocytosis resolved  -Repeat Chest xray/urinalysis/Blood cultures and venous doppler and CT abd/pelvis d/t recent ICH.     11/27- CT- went over with pt/family- looks OK except constipation and subcentimeter hypodensity in liver  12/1 WBCs normal    -UCX with diptheroids-d/c keflex  , has completed 5 d course   -Blood  cx NGTD 3days       Latest Ref Rng & Units 09/16/2024    5:22 AM 09/13/2024    5:01 AM 09/11/2024    5:06 AM  CBC  WBC 4.0 - 10.5 K/uL 7.7  8.4  9.5   Hemoglobin 12.0 - 15.0 g/dL 86.7  86.9  86.5   Hematocrit 36.0 - 46.0 % 39.6  38.7  39.2   Platelets 150 - 400 K/uL 488  464  407     16. Tachycardia improved on Toprol  XL but BPs on low side will reduce amlodipine  and check Ortho vitals    Vitals:   09/18/24 1958 09/19/24 0419  BP: 138/85 108/60  Pulse: 81 75  Resp: 16 18  Temp: 99 F (37.2 C) 98.4 F (36.9 C)  SpO2: 100% 100%  Reduced amlodipine  to 5mg  starting 12/2 in desired range, HR still a little high but will not adjust toprol  at this time    - 12/6-7: normotensive   18. Constipation  11/27- LBM after sorbitol --needs to eat more!  11/30 add senna-s bid   LBM 12/1 - PO intakes appear OK - add miralax  daily  12-7: No BM.  Give sorbitol  today.        LOS: 11 days A FACE TO FACE EVALUATION WAS PERFORMED  Joesph JAYSON Likes 09/19/2024, 2:51 PM

## 2024-09-20 LAB — CBC WITH DIFFERENTIAL/PLATELET
Abs Immature Granulocytes: 0.04 K/uL (ref 0.00–0.07)
Basophils Absolute: 0 K/uL (ref 0.0–0.1)
Basophils Relative: 0 %
Eosinophils Absolute: 0.1 K/uL (ref 0.0–0.5)
Eosinophils Relative: 1 %
HCT: 37.3 % (ref 36.0–46.0)
Hemoglobin: 12.4 g/dL (ref 12.0–15.0)
Immature Granulocytes: 0 %
Lymphocytes Relative: 35 %
Lymphs Abs: 3.4 K/uL (ref 0.7–4.0)
MCH: 29.8 pg (ref 26.0–34.0)
MCHC: 33.2 g/dL (ref 30.0–36.0)
MCV: 89.7 fL (ref 80.0–100.0)
Monocytes Absolute: 0.7 K/uL (ref 0.1–1.0)
Monocytes Relative: 7 %
Neutro Abs: 5.4 K/uL (ref 1.7–7.7)
Neutrophils Relative %: 57 %
Platelets: 376 K/uL (ref 150–400)
RBC: 4.16 MIL/uL (ref 3.87–5.11)
RDW: 13.4 % (ref 11.5–15.5)
WBC: 9.8 K/uL (ref 4.0–10.5)
nRBC: 0 % (ref 0.0–0.2)

## 2024-09-20 LAB — BASIC METABOLIC PANEL WITH GFR
Anion gap: 11 (ref 5–15)
BUN: 23 mg/dL (ref 8–23)
CO2: 19 mmol/L — ABNORMAL LOW (ref 22–32)
Calcium: 9.2 mg/dL (ref 8.9–10.3)
Chloride: 111 mmol/L (ref 98–111)
Creatinine, Ser: 2.05 mg/dL — ABNORMAL HIGH (ref 0.44–1.00)
GFR, Estimated: 27 mL/min — ABNORMAL LOW (ref >60)
Glucose, Bld: 101 mg/dL — ABNORMAL HIGH (ref 70–99)
Potassium: 4.1 mmol/L (ref 3.5–5.1)
Sodium: 141 mmol/L (ref 135–145)

## 2024-09-20 NOTE — Progress Notes (Signed)
 Physical Therapy Session Note  Patient Details  Name: Rhonda Cortez MRN: [id number] Date of Birth: Feb 19, 1961  Today's Date: 09/20/2024 PT Individual Time: 1117-1202 PT Individual Time Calculation (min): 45 min   Short Term Goals: Week 1:  PT Short Term Goal 1 (Week 1): Pt will perform transfers with CGA PT Short Term Goal 1 - Progress (Week 1): Met PT Short Term Goal 2 (Week 1): Pt will demonstrate dynamic standing balance with CGA PT Short Term Goal 2 - Progress (Week 1): Met PT Short Term Goal 3 (Week 1): Pt will perform gait x 150' with CGA PT Short Term Goal 3 - Progress (Week 1): Met PT Short Term Goal 4 (Week 1): Pt will perform stairs with CGA PT Short Term Goal 4 - Progress (Week 1): Met Week 2:  PT Short Term Goal 1 (Week 2): STG = LTG d/t ELOS  Skilled Therapeutic Interventions/Progress Updates:  Patient supine in bed on entrance to room. Family members present. Patient alert and agreeable to PT session.   Patient with no pain complaint at start of session.  Pt performed supine <> sit with Mod I. No cues required for technique/ safety.  Pt performed sit<>stand and stand pivot transfers throughout session with Mod I/ supervision. No cues required for general transfer safety.  Demonstrated car transfer with supervision but self-chooses to step LLE into footwell to initiate. Is able to perform safely but educated pt and family on self check-in and if feeling weaker or confused, then sit first and pivot on seat to bring BLE into footwell. Demos understanding and then self-initiates exit from vehicle with seated pivot method.   Throughout session is able to ambulate department with no AD and requiring vc/ visual cues from therapist for directionality. Good quality of gait. Maintains safe distance from obstacles until end of session when she demos close calls to objects on R side including corner and medical cart in hallway.   Is able to demonstrate safe stair navigation with  ability to ascend/ descend twelve 6 steps using BHR with supervision.   On return to room, stopped pt at midway point of [address] hallway and asked pt her room number. Unable to relate so informed that she is currently in 4W05. Asked pt to determine which direction her room is and pt looks around but unable to decide. Pointed out sign above hallway that indicates rooms [phone] to the right and [phone] to the left. Provided pt with questioning cues in order to help determine direction but pt still unable to problem solve correct direction. Related need for turn to R and then pt counted down room numbers for patient until 4W07 for vc/ visual cue. Then asked pt to find her own room. She peers into 4W05  and studies contents prior to initiating entry. Family observing throughout.   Pt with questions re: driving and related that although she moves well in safe, controlled environment, MD will help to determine safety and ability to initiate driving again. Educated on need to be able to make quick decisions and need to retrain hand/ foot and eye coordination prior to attempting. MD will help her and family to determine safe time to retrain, but for now to depend on family/ friends for transportation.   Family educated on need to provide pt opportunity to work on problem solving when encountering a problem that seems to stump her. Questioning and leading cues initially should improve later to fewer and fewer cues but for now to assist her  in solving problems and not just solving for her.   Also educated on potential need to start preparing to leave house earlier with more time to dress and ready self as this may take more time initially.   All demo understanding and relate readiness to come home. Educated on stroke support group and indicated info page in pt binder for group time/ place.   Patient supine in bed at end of session with brakes locked, bed alarm set, and all needs within reach.   Therapy  Documentation Precautions:  Precautions Precautions: Fall Restrictions Weight Bearing Restrictions Per Provider Order: No  Pain:  No pain indicated this session.   Therapy/Group: Individual Therapy  Mliss DELENA Milliner PT, DPT, CSRS 09/20/2024, 5:40 PM

## 2024-09-20 NOTE — Progress Notes (Signed)
 Speech Language Pathology Daily Session Note  Patient Details  Name: Rhonda Cortez MRN: [id number] Date of Birth: 12/27/1960  Today's Date: 09/20/2024 SLP Individual Time: 0800-0900 1235-1320 SLP Individual Time Calculation (min): 60 min 45 minutes  Short Term Goals: Week 2: SLP Short Term Goal 1 (Week 2): STG = LTG due to ELOS  Skilled Therapeutic Interventions: Session 1: Skilled therapy session focused on communication goals. SLP facilitated session by targeting automatic speech tasks. Patient independently recalled and wrote first name, however required written model for last name. Patient required minA to name numbers 1-10 and maxA to write due to perseveration on prior tasks. SLP then targeted confrontational naming through picture cards. Patient named items with 50% accuracy given modA. In additional minutes of the session, SLP targeted auditory/visual comprehension through ID task. Patient identified pictures given verbalized name with 70% accuracy and modA in a FO4. Patient with increased perseveration this session. Patient left in bed with alarm set and call bell in reach. Continue POC Session 2: Skilled therapy session focused on communication goals and family education. SLP educated patients family on patients current communicative status and expressive/receptive language deficits. SLP encouraged patients family to utilize yes/no questions when speaking with patient and allow extra processing time for word finding. SLP educated patients family on types of cues to assist in word finding including phonemic and semantic. SLP targeted communication goals through automatic speech tasks and sequencing. Patient verbalized numbers 1-10, days of the week and months of the year independently given initial item in sequence. Patient sequenced written numbers independently, required minA for sequencing DOW and modA to sequence months of the year. Patient and family verbalized understanding of education  provided with no further questions. Patient left in bed with family present and call bell in reach. Continue POC    Pain Session 1: none reported Session 2: none reported  Therapy/Group: Individual Therapy  Zakai Gonyea M.A., CCC-SLP 09/20/2024, 7:41 AM

## 2024-09-20 NOTE — Progress Notes (Signed)
 Speech Language Pathology Discharge Summary  Patient Details  Name: Rhonda Cortez MRN: [id number] Date of Birth: 1961-05-08  Date of Discharge from SLP service:September 21, 2024   Patient has met 4 of 5 long term goals.  Patient to discharge at overall Min;Mod level.  Reasons goals not met: cont to require modA to name functional items   Clinical Impression/Discharge Summary: Pt has made good gains and has met 4 of 5 LTG's this admission due to improved communication and cognition. Pt is currently an overall minA for receptive language tasks and requires modA for expressive language tasks. Patient requires min cues for sustained attention and modA for orientation. Pt/family education complete and pt will discharge home with 24 hour supervision from friends/family/etc. Pt would benefit from OP f/u ST services to maximize communication/cognition in order to maximize functional independence.   Care Partner:  Caregiver Able to Provide Assistance: Yes  Type of Caregiver Assistance: Physical;Cognitive  Recommendation:  Outpatient SLP;24 hour supervision/assistance  Rationale for SLP Follow Up: Maximize functional communication;Maximize cognitive function and independence   Equipment: n/a   Reasons for discharge: Discharged from hospital   Patient/Family Agrees with Progress Made and Goals Achieved: Yes    Cassidi Sockwell M.A., CCC-SLP 09/20/2024, 2:21 PM

## 2024-09-20 NOTE — Progress Notes (Signed)
 Occupational Therapy Session Note  Patient Details  Name: Rhonda Cortez MRN: [id number] Date of Birth: Dec 15, 1960  Today's Date: 09/20/2024 OT Individual Time: 1005-1100 OT Individual Time Calculation (min): 55 min    Short Term Goals: Week 1:  OT Short Term Goal 1 (Week 1): pt will be able to ambulate to bathroom with RW with min A. OT Short Term Goal 1 - Progress (Week 1): Met OT Short Term Goal 2 (Week 1): Pt will be able to don all LB clothing with supervision. OT Short Term Goal 2 - Progress (Week 1): Met OT Short Term Goal 3 (Week 1): Pt will be able to hold balance safely on toilet and toilet with Supervision. OT Short Term Goal 3 - Progress (Week 1): Met OT Short Term Goal 4 (Week 1): Pt will be able to stand at sink for grooming tasks with CGA. OT Short Term Goal 4 - Progress (Week 1): Met Week 2:  OT Short Term Goal 1 (Week 2): STGs = LTGs  Skilled Therapeutic Interventions/Progress Updates:    Pt received in bed with her sister and friend present. Pt finishing eating a biscuit and she was agreeable to me talking to them about her progress and areas she continues to need to work on.  Discussed R visual field cut, processing time, aphasia impacts and visual processing impacts on function.  Discussed how to help her progress at home using IADLs and the level of supervision she will need.    Pt then ready to get up and demonstrated: -independence with bed mobility -independence with sit to stand and ambulating to sink to brush teeth.  -toothbrush and paste on R side of sink, pt initially asked where they were. Cued her to turn her head and then pt able to find items.  -pt completed oral care but declined shower with visitors present. Will plan to shower tomorrow.   -pt demonstrated how she can walk around the room, in the hallway all the way to the ADL apt with supervision only. -the other day pt not able to find the exit sign above her to the R or follow the directions to turn L  down the hallway but today she did! She followed directions well. - in ADL apt discussed her walk in shower. Will try to have pt stand to shower the entire time tomorrow.  Her friend will get her a shower chair anyway.  -in apt, pt instructed to find a pan to cook 2 eggs in and she found one that was the right size with no cues.  -then instructed to find a pot to make pasta in and she found one without cues -discussed kitchen safety with R visual field cut and slower processing. Recommending she get involved with light meal prep that does not involve hot surfaces or anything she can risk cutting herself  -strongly encouraged them to have her do her own laundry, dust, sweep and vacuum with supervision for safety. -she will need A with finances but encouraged them to have her try to read the bills and state how they should be paid.   -from apt back to her room demonstrated how they can encourage her to walk at a faster pace by guiding her with her hand. He friend practiced guiding her at a faster pace.   Pt participated well and family comfortable with the level of cuing and supervision she will need at home.    Therapy Documentation Precautions:  Precautions Precautions: Fall Restrictions Weight Bearing  Restrictions Per Provider Order: No Pain: Pain Assessment Pain Scale: 0-10 Pain Score: 0-No pain ADL: ADL Eating: Set up Grooming: Supervision/safety Upper Body Bathing: Supervision/safety Where Assessed-Upper Body Bathing: Shower Lower Body Bathing: Supervision/safety Where Assessed-Lower Body Bathing: Shower Upper Body Dressing: Supervision/safety Where Assessed-Upper Body Dressing: Chair Lower Body Dressing: Supervision/safety Where Assessed-Lower Body Dressing: Chair Toileting: Supervision/safety Where Assessed-Toileting: Teacher, Adult Education: Close supervision Statistician Method: Proofreader: Acupuncturist: Close  supervision Film/video Editor Method: Designer, Industrial/product: Emergency planning/management officer, Grab bars  Therapy/Group: Individual Therapy  Novah Nessel 09/20/2024, 11:50 AM

## 2024-09-20 NOTE — Progress Notes (Signed)
 Patient ID: Rhonda Cortez, female   DOB: 03-31-1961, 63 y.o.   MRN: [id number] Met with pt and her family who is here for hands on education and it is going well. They are aware she will need 24/7 supervision for safety and cognition issues. No equipment needs. Agreeable to going to OP on [address] for therapies. Will ask PA to place order in epic.

## 2024-09-20 NOTE — Progress Notes (Signed)
 PROGRESS NOTE   Subjective/Complaints:  Working with SLP on [employer] on cards , no new issues.  Denies HA  Labs reviewed, stable   Vitals stable      09/20/2024    4:39 AM 09/19/2024    8:10 PM 09/19/2024    3:23 PM  Vitals with BMI  Systolic 114 125 888  Diastolic 70 85 71  Pulse 78 92 78    No results for input(s): GLUCAP in the last 72 hours.      ROS: Limited due to cognitive/behavioral   Objective:   No results found.  Recent Labs    09/20/24 0524  WBC 9.8  HGB 12.4  HCT 37.3  PLT 376    Recent Labs    09/19/24 1558 09/20/24 0524  NA 139 141  K 3.9 4.1  CL 109 111  CO2 22 19*  GLUCOSE 106* 101*  BUN 24* 23  CREATININE 2.03* 2.05*  CALCIUM  9.0 9.2     Intake/Output Summary (Last 24 hours) at 09/20/2024 0831 Last data filed at 09/19/2024 1700 Gross per 24 hour  Intake 480 ml  Output --  Net 480 ml         Physical Exam: Vital Signs Blood pressure 114/70, pulse 78, temperature 97.8 F (36.6 C), resp. rate 19, height 5' 3 (1.6 m), weight 63 kg, SpO2 98%.    General: No acute distress.  Sitting upright in bed. Mood and affect are appropriate Heart: Regular rate and rhythm no rubs murmurs or extra sounds Lungs: Clear to auscultation, breathing unlabored, no rales or wheezes Abdomen: Positive bowel sounds, soft nontender to palpation, nondistended Extremities: No clubbing, cyanosis, or edema Skin: No evidence of breakdown, no evidence of rash Neurologic:  Awake, alert, oriented to self and place; not to time. + Cognitive delay, + Language: Dysarthria mild, hypophonic, minimal spontaneous speech  Cranial nerves II through XII intact, motor strength is 5/5 in bilateral deltoid, bicep, tricep, grip, hip flexor, knee extensors, ankle dorsiflexor and plantar flexor Oriented to person and hospital but not city  Musculoskeletal: Full range of motion in all 4 extremities. No joint  swelling    Assessment/Plan: 1. Functional deficits which require 3+ hours per day of interdisciplinary therapy in a comprehensive inpatient rehab setting. Physiatrist is providing close team supervision and 24 hour management of active medical problems listed below. Physiatrist and rehab team continue to assess barriers to discharge/monitor patient progress toward functional and medical goals  Care Tool:  Bathing    Body parts bathed by patient: Right arm, Left arm, Chest, Abdomen, Front perineal area, Buttocks, Right upper leg, Left upper leg, Right lower leg, Left lower leg, Face         Bathing assist Assist Level: Contact Guard/Touching assist     Upper Body Dressing/Undressing Upper body dressing   What is the patient wearing?: Pull over shirt    Upper body assist Assist Level: Supervision/Verbal cueing    Lower Body Dressing/Undressing Lower body dressing      What is the patient wearing?: Underwear/pull up, Pants     Lower body assist Assist for lower body dressing: Minimal Assistance - Patient > 75%  Toileting Toileting    Toileting assist Assist for toileting: Supervision/Verbal cueing     Transfers Chair/bed transfer  Transfers assist     Chair/bed transfer assist level: Supervision/Verbal cueing     Locomotion Ambulation   Ambulation assist      Assist level: Supervision/Verbal cueing Assistive device: No Device Max distance: 150   Walk 10 feet activity   Assist     Assist level: Supervision/Verbal cueing Assistive device: No Device   Walk 50 feet activity   Assist    Assist level: Supervision/Verbal cueing Assistive device: No Device    Walk 150 feet activity   Assist Walk 150 feet activity did not occur: Safety/medical concerns (endurnace)  Assist level: Supervision/Verbal cueing Assistive device: No Device    Walk 10 feet on uneven surface  activity   Assist     Assist level: Supervision/Verbal  cueing Assistive device: Other (comment) (no device)   Wheelchair     Assist Is the patient using a wheelchair?: Yes Type of Wheelchair: Manual    Wheelchair assist level: Dependent - Patient 0%      Wheelchair 50 feet with 2 turns activity    Assist        Assist Level: Dependent - Patient 0%   Wheelchair 150 feet activity     Assist      Assist Level: Dependent - Patient 0%   Blood pressure 114/70, pulse 78, temperature 97.8 F (36.6 C), resp. rate 19, height 5' 3 (1.6 m), weight 63 kg, SpO2 98%.   Medical Problem List and Plan: 1. 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  Antithrombotics: -DVT/anticoagulation:  Mechanical: Sequential compression devices, below knee Bilateral lower extremities  -dopplers reviewed and negative Pharmaceutical: Heparin  ambulation distance is greater than 100 feet will discontinue heparin              -antiplatelet therapy: continue Aspirin  81 mg --No DAPT due to recent ICH   3. Pain Management:con't tylenol  as needed Add Topirimate for HA    4. Mood/Behavior/Sleep: LCSW to follow for evaluation and support when available.              -antipsychotic agents: N/A              -sleep patterns better   5. Neuropsych/cognition: This patient is not capable of making decisions on her own behalf.   6. Skin/Wound Care: Routine pressure relief measures.    7. AKI on CKD: finished IVF. Baseline Cr 1.6-1.9.        Latest Ref Rng & Units 09/20/2024    5:24 AM 09/19/2024    3:58 PM 09/16/2024    5:22 AM  BMP  Glucose 70 - 99 mg/dL 898  893  890   BUN 8 -  23 mg/dL 23  24  25    Creatinine 0.44 - 1.00 mg/dL 7.94  7.96  7.72   Sodium 135 - 145 mmol/L 141  139  139  Potassium 3.5 - 5.1 mmol/L 4.1  3.9  4.7   Chloride 98 - 111 mmol/L 111  109  105   CO2 22 - 32 mmol/L 19  22  25    Calcium  8.9 - 10.3 mg/dL 9.2  9.0  9.5    Creat close  baseline , oral intake ~740mL per day would like to encourage intake to ~1528mL per day   9. Hx recent ICH: continue Lipitor 40 mg daily    10. Hx stroke/TIA: Stroke 2014-no significant residual deficit. Continue statin.    11. Seizure: Episode on 11/22, EEG negative for seizure activity. Loaded with Keppra , now on Vimpat  50 mg bid.    12. 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 Avoid low BP-Longterm BP goal 130-150 systolic given severe stenosis/occlusion of left MI and P1.              -  orthostatics improved. Continue to monitor    13.Hx of HTN: continue home regime- Norvac and Valsartan .    not on ACE-I, will reduce  14. HLD: was on Pitavastatin  at home, continue Atorvastatin  40 mg daily.    15.  Fever/leukocytosis leukocytosis resolved  -Repeat Chest xray/urinalysis/Blood cultures and venous doppler and CT abd/pelvis d/t recent ICH.     11/27- CT- went over with pt/family- looks OK except constipation and subcentimeter hypodensity in liver  12/1 WBCs normal    -UCX with diptheroids-d/c keflex  , has completed 5 d course   -Blood cx NGTD 3days       Latest Ref Rng & Units 09/20/2024    5:24 AM 09/16/2024    5:22 AM 09/13/2024    5:01 AM  CBC  WBC 4.0 - 10.5 K/uL 9.8  7.7  8.4   Hemoglobin 12.0 - 15.0 g/dL 87.5  86.7  86.9   Hematocrit 36.0 - 46.0 % 37.3  39.6  38.7   Platelets 150 - 400 K/uL 376  488  464     16. Tachycardia improved on Toprol  XL but BPs on low side will reduce amlodipine  and check Ortho vitals    Vitals:   09/19/24 2010 09/20/24 0439  BP: 125/85 114/70  Pulse: 92 78  Resp: 18 19  Temp: 98 F (36.7 C) 97.8 F  (36.6 C)  SpO2: 100% 98%  Reduced amlodipine  to 5mg  starting 12/2 in desired range, HR still a little high but will not adjust toprol  at this time    - 12/6-7: normotensive   18. Constipation  11/27- LBM after sorbitol --needs to eat more!  11/30 add senna-s bid   LBM 12/1 - PO intakes appear OK - add miralax  daily  12-7: No BM.  Give sorbitol  today.        LOS: 12 days A FACE TO FACE EVALUATION WAS PERFORMED  Prentice FORBES Compton 09/20/2024, 8:31 AM

## 2024-09-21 ENCOUNTER — Other Ambulatory Visit (HOSPITAL_COMMUNITY): Payer: Self-pay

## 2024-09-21 MED ORDER — POLYETHYLENE GLYCOL 3350 17 GM/SCOOP PO POWD
17.0000 g | Freq: Every day | ORAL | 0 refills | Status: AC
  Filled 2024-09-21: qty 238, 14d supply, fill #0

## 2024-09-21 MED ORDER — ATORVASTATIN CALCIUM 40 MG PO TABS
40.0000 mg | ORAL_TABLET | Freq: Every day | ORAL | 0 refills | Status: DC
  Filled 2024-09-21: qty 30, 30d supply, fill #0

## 2024-09-21 MED ORDER — METOPROLOL SUCCINATE ER 25 MG PO TB24
12.5000 mg | ORAL_TABLET | Freq: Every day | ORAL | 0 refills | Status: DC
  Filled 2024-09-21: qty 30, 60d supply, fill #0

## 2024-09-21 MED ORDER — LACOSAMIDE 50 MG PO TABS
50.0000 mg | ORAL_TABLET | Freq: Two times a day (BID) | ORAL | 0 refills | Status: DC
  Filled 2024-09-21: qty 60, 30d supply, fill #0

## 2024-09-21 MED ORDER — SENNOSIDES-DOCUSATE SODIUM 8.6-50 MG PO TABS
1.0000 | ORAL_TABLET | Freq: Two times a day (BID) | ORAL | 0 refills | Status: AC
  Filled 2024-09-21: qty 60, 30d supply, fill #0

## 2024-09-21 MED ORDER — SORBITOL 70 % SOLN
60.0000 mL | Freq: Once | Status: DC
  Filled 2024-09-21: qty 60

## 2024-09-21 MED ORDER — TOPIRAMATE 25 MG PO TABS
25.0000 mg | ORAL_TABLET | Freq: Two times a day (BID) | ORAL | 0 refills | Status: DC
  Filled 2024-09-21: qty 60, 30d supply, fill #0

## 2024-09-21 MED ORDER — ASPIRIN 81 MG PO TBEC
81.0000 mg | DELAYED_RELEASE_TABLET | Freq: Every day | ORAL | 0 refills | Status: AC
  Filled 2024-09-21: qty 100, 100d supply, fill #0

## 2024-09-21 MED ORDER — AMLODIPINE BESYLATE 5 MG PO TABS
5.0000 mg | ORAL_TABLET | Freq: Every day | ORAL | 0 refills | Status: DC
  Filled 2024-09-21: qty 30, 30d supply, fill #0

## 2024-09-21 NOTE — Progress Notes (Signed)
 Occupational Therapy Discharge Summary  Patient Details  Name: Rhonda Cortez MRN: [id number] Date of Birth: 1960-11-01  Date of Discharge from OT service:September 21, 2024   Patient has met 10 of 11 long term goals due to improved activity tolerance, improved balance, postural control, ability to compensate for deficits, functional use of  RIGHT lower extremity, improved attention, improved awareness, and improved coordination.  Patient to discharge at overall Supervision level in her home environment, but is mod ind with toileting and dressing and set up A with shower.    Patient's care partner is independent to provide the necessary cognitive assistance at discharge.  Family education completed. Pt provided with HEP.   Reasons goals not met: pt did not meet day to day memory goal of min A as she needs mod -max A.  Her family is able to provide the supervision and A at home.   Recommendation:  Patient will benefit from ongoing skilled OT services in outpatient setting to continue to advance functional skills in the area of BADL and iADL.  Equipment: No equipment provided - family will get a shower chair  Reasons for discharge: treatment goals met  Patient/family agrees with progress made and goals achieved: Yes  OT Discharge Precautions/Restrictions  Precautions Precautions: Fall Recall of Precautions/Restrictions: Intact Restrictions Weight Bearing Restrictions Per Provider Order: No     ADL ADL Eating: Independent Grooming: Independent Upper Body Bathing: Setup Where Assessed-Upper Body Bathing: Shower Lower Body Bathing: Setup Where Assessed-Lower Body Bathing: Shower Upper Body Dressing: Independent Where Assessed-Upper Body Dressing: Edge of bed Lower Body Dressing: Modified independent Where Assessed-Lower Body Dressing: Edge of bed Toileting: Independent Where Assessed-Toileting: Teacher, Adult Education: Community Education Officer Method: Public House Manager: Acupuncturist: Close supervision Film/video Editor Method: Designer, Industrial/product: Emergency planning/management officer, Grab bars Vision Baseline Vision/History: 1 Wears glasses Patient Visual Report: No change from baseline Vision Assessment?: Yes Eye Alignment: Within Functional Limits Ocular Range of Motion: Within Functional Limits Alignment/Gaze Preference: Within Defined Limits Tracking/Visual Pursuits: Able to track stimulus in all quads without difficulty Saccades: Within functional limits Convergence: Within functional limits Visual Fields: Other (comment) (with confrontation testing, WFL but pt has demonstrated mild R inattention with functional tasks) Perception  Perception: Impaired -mild inattention to R environment, has improved from admission Praxis Praxis: Impaired Praxis Impairment Details: Perseveration Praxis-Other Comments: pt tends to go over same body parts with bathing and needs min cues to sequence; initiation has improved a great deal along with motor planning Cognition Cognition Overall Cognitive Status: Impaired/Different from baseline Memory: Impaired Memory Impairment: Storage deficit;Decreased recall of new information Sustained Attention: Appears intact Sustained Attention Impairment: Verbal basic Selective Attention: Impaired Selective Attention Impairment: Verbal basic;Functional basic Awareness: Impaired Awareness Impairment: Emergent impairment Problem Solving: Impaired Problem Solving Impairment: Verbal basic;Functional basic Organizing: Impaired Organizing Impairment: Verbal basic;Functional basic Initiating: Appears intact Initiating Impairment: Functional basic Safety/Judgment: Appears intact Brief Interview for Mental Status (BIMS) Repetition of Three Words (First Attempt): 3 Temporal Orientation: Year: Correct Temporal Orientation: Month: Missed by more than 1 month Temporal Orientation: Day:  Incorrect Recall: Sock: No, could not recall Recall: Blue: No, could not recall Recall: Bed: No, could not recall BIMS Summary Score: 6 Sensation Sensation Light Touch: Appears Intact Hot/Cold: Appears Intact Proprioception: Appears Intact Stereognosis: Appears Intact Coordination Gross Motor Movements are Fluid and Coordinated: No Fine Motor Movements are Fluid and Coordinated: Yes Coordination and Movement Description: improved R LE coordination from admission, but movements  not fluid. Pt tends to be guarded with tasks. Motor  Motor Motor: Within Functional Limits Mobility    Supervision with shower stall transfers, independent to toilet and walking around familiar spaces Trunk/Postural Assessment  Cervical Assessment Cervical Assessment: Within Functional Limits Thoracic Assessment Thoracic Assessment: Within Functional Limits Lumbar Assessment Lumbar Assessment: Within Functional Limits Postural Control Trunk Control: significant improvement from admission, no longer has a right lean Protective Responses: significant improvement from admission, pt now has improved protective response time  Balance Static Sitting Balance Static Sitting - Level of Assistance: 7: Independent Dynamic Sitting Balance Dynamic Sitting - Level of Assistance: 7: Independent Static Standing Balance Static Standing - Level of Assistance: 7: Independent Dynamic Standing Balance Dynamic Standing - Level of Assistance: 7: Independent Dynamic Standing - Balance Activities: Other (comment) Dynamic Standing - Comments: during self care of dressing, bathing, toileting Extremity/Trunk Assessment RUE Assessment RUE Assessment: Within Functional Limits LUE Assessment LUE Assessment: Within Functional Limits   Letecia Arps 09/21/2024, 12:36 PM

## 2024-09-21 NOTE — Progress Notes (Signed)
 Physical Therapy Discharge Summary  Patient Details  Name: Rhonda Cortez MRN: [id number] Date of Birth: 1961-09-15  Date of Discharge from PT service:September 21, 2024   Patient has met 8 of 8 long term goals due to {due un:6958322}.  Patient to discharge at [facility] level {LOA:3049010}.   Patient's care partner {care partner:[id number]} to provide the necessary {assistance:3041652} assistance at discharge.  Recommendation:  Patient will benefit from ongoing skilled PT services in {setting:3041680} to continue to advance safe functional mobility, address ongoing impairments in ***, and minimize fall risk.  Equipment: {equipment:3041657}  Reasons for discharge: {Reason for discharge:3049018}  Patient/family agrees with progress made and goals achieved: {Pt/Family agree with progress/goals:3049020}  PT Discharge Precautions/Restrictions Precautions Precautions: Fall Restrictions Weight Bearing Restrictions Per Provider Order: No Pain Pain Assessment Pain Scale: 0-10 Pain Score: 0-No pain Pain Interference Pain Interference Pain Effect on Sleep: 1. Rarely or not at all Pain Interference with Therapy Activities: 1. Rarely or not at all Pain Interference with Day-to-Day Activities: 1. Rarely or not at all Vision/Perception  Vision - History Ability to See in Adequate Light: 1 Impaired Vision - Assessment Eye Alignment: Within Functional Limits Ocular Range of Motion: Within Functional Limits Alignment/Gaze Preference: Within Defined Limits Tracking/Visual Pursuits: Able to track stimulus in all quads without difficulty Saccades: Within functional limits Convergence: Within functional limits Perception Perception: Impaired Preception Impairment Details: Inattention/Neglect Perception-Other Comments: mild inattention to R environment, has improved from admission Praxis Praxis: Impaired Praxis Impairment Details: Perseveration Praxis-Other Comments: pt tends to go over  same body parts with bathing and needs min cues to sequence; initiation has improved a great deal along with motor planning  Cognition Overall Cognitive Status: Impaired/Different from baseline Arousal/Alertness: Awake/alert Orientation Level: Oriented to person;Oriented to place;Oriented to situation Year: 2024 Attention: Sustained Sustained Attention: Appears intact Sustained Attention Impairment: Verbal basic Selective Attention: Impaired Selective Attention Impairment: Verbal basic;Functional basic Memory: Impaired Memory Impairment: Decreased recall of new information Awareness: Impaired Awareness Impairment: Emergent impairment Problem Solving: Impaired Problem Solving Impairment: Verbal basic;Functional basic Organizing: Impaired Organizing Impairment: Verbal basic;Functional basic Initiating: Appears intact Initiating Impairment: Functional basic Safety/Judgment: Appears intact Sensation Sensation Light Touch: Appears Intact Hot/Cold: Appears Intact Proprioception: Appears Intact Stereognosis: Appears Intact Coordination Gross Motor Movements are Fluid and Coordinated: Yes Fine Motor Movements are Fluid and Coordinated: Yes Coordination and Movement Description: improved R LE coordination from admission, but movements not fluid. Pt tends to be guarded with tasks. Motor  Motor Motor: Within Functional Limits Motor - Discharge Observations: Very mild coordination when ambulating but able to maintain standing balance  Mobility Bed Mobility Bed Mobility: Rolling Left;Supine to Sit;Sit to Supine Rolling Right: Independent Rolling Left: Independent Supine to Sit: Independent Sit to Supine: Independent Transfers Transfers: Sit to Stand;Stand to Sit;Stand Pivot Transfers Sit to Stand: Independent Stand to Sit: Independent Stand Pivot Transfers: Independent Transfer (Assistive device): None Locomotion  Gait Ambulation: Yes Gait Assistance: Supervision/Verbal  cueing Gait Distance (Feet): 150 Feet Gait Gait: Yes Gait Pattern: Impaired Gait Pattern: Step-through pattern;Poor foot clearance - right;Poor foot clearance - left;Narrow base of support Gait velocity: reduced but has improved Stairs / Additional Locomotion Stairs: Yes Stairs Assistance: Supervision/Verbal cueing Stair Management Technique: Two rails Number of Stairs: 12 Height of Stairs: 6 Wheelchair Mobility Wheelchair Mobility: No  Trunk/Postural Assessment  Cervical Assessment Cervical Assessment: Within Functional Limits Thoracic Assessment Thoracic Assessment: Within Functional Limits Lumbar Assessment Lumbar Assessment: Within Functional Limits Postural Control Trunk Control: significant improvement from admission, no longer has a right  lean Protective Responses: significant improvement from admission, pt now has improved protective response time  Balance Standardized Balance Assessment Standardized Balance Assessment: Timed Up and Go Test;Berg Balance Test Berg Balance Test Sit to Stand: Able to stand without using hands and stabilize independently Standing Unsupported: Able to stand safely 2 minutes Sitting with Back Unsupported but Feet Supported on Floor or Stool: Able to sit safely and securely 2 minutes Stand to Sit: Sits safely with minimal use of hands Transfers: Able to transfer safely, minor use of hands Standing Unsupported with Eyes Closed: Able to stand 10 seconds safely Standing Ubsupported with Feet Together: Able to place feet together independently and stand for 1 minute with supervision From Standing, Reach Forward with Outstretched Arm: Can reach forward >12 cm safely (5) From Standing Position, Pick up Object from Floor: Able to pick up shoe, needs supervision From Standing Position, Turn to Look Behind Over each Shoulder: Looks behind from both sides and weight shifts well Turn 360 Degrees: Able to turn 360 degrees safely but slowly Standing  Unsupported, Alternately Place Feet on Step/Stool: Able to complete 4 steps without aid or supervision Standing Unsupported, One Foot in Front: Able to plae foot ahead of the other independently and hold 30 seconds Standing on One Leg: Able to lift leg independently and hold equal to or more than 3 seconds Total Score: 46 Timed Up and Go Test TUG: Normal TUG Normal TUG (seconds): 11.26 Static Sitting Balance Static Sitting - Balance Support: No upper extremity supported;Feet supported Static Sitting - Level of Assistance: 7: Independent Dynamic Sitting Balance Dynamic Sitting - Balance Support: During functional activity;Feet supported Dynamic Sitting - Level of Assistance: 7: Independent Static Standing Balance Static Standing - Balance Support: No upper extremity supported Static Standing - Level of Assistance: 7: Independent Dynamic Standing Balance Dynamic Standing - Balance Support: During functional activity Dynamic Standing - Level of Assistance: 7: Independent Dynamic Standing - Balance Activities: Reaching across midline;Reaching for weighted objects;Reaching for objects Extremity Assessment  RLE Assessment RLE Assessment: Within Functional Limits LLE Assessment LLE Assessment: Within Functional Limits   Kent Riendeau PTA   09/21/2024, 2:05 PM

## 2024-09-21 NOTE — Progress Notes (Signed)
 Speech Language Pathology Daily Session Note  Patient Details  Name: Rhonda Cortez MRN: [id number] Date of Birth: 01/12/1961  Today's Date: 09/21/2024 SLP Individual Time: 1000-1059 SLP Individual Time Calculation (min): 59 min  Short Term Goals: Week 2: SLP Short Term Goal 1 (Week 2): STG = LTG due to ELOS  Skilled Therapeutic Interventions: SLP conducted skilled therapy session targeting communication goals. SLP facilitated word finding tasks of varying complexities. During responsive naming, patient benefited from mod cues to name described items. SLP then facilitated object description task where patient was again mod assist for naming and object description. In final minutes of session, targeted divergent naming, where patient was mod to max assist to name 3 items in each category. Patient was left in room, though alarm not set d/t modified independent status. Patient is appropriate for discharge, see summary for full details.     Pain Pain Assessment Pain Scale: 0-10 Pain Score: 0-No pain  Therapy/Group: Individual Therapy  Jashawn Floyd, M.A., CCC-SLP  Ronan Duecker A Radley Teston 09/21/2024, 11:02 AM

## 2024-09-21 NOTE — Progress Notes (Signed)
 Occupational Therapy Session Note  Patient Details  Name: Rhonda Cortez MRN: [id number] Date of Birth: 1961/06/16  Today's Date: 09/21/2024 OT Individual Time: 9169-9084 and 1135 - 1205 OT Individual Time Calculation (min): 45 min and 30 min    Short Term Goals: Week 2:  OT Short Term Goal 1 (Week 2): STGs = LTGs  Skilled Therapeutic Interventions/Progress Updates:    Visit 1: No c/o pain Pt received EOB, turned bed alarm off and asked her if she would shower today. Pt agreeable. Told pt to proceed and I would just be present if she needed me as I wanted to see her initiation and motor planning without cues.  Pt got out of bed and moved about the room to gather clothing. She was having difficulty finding her underwear in the bag so I helped her, otherwise she found her clothing herself.   Pt walked into bathroom and turned on shower water, undressed and stepped into shower without A or cues needed.    Once in shower she did need cues to pay attention to where water was spraying as she faced shower head pointing out of shower, cues to use bodywash on wash cloth.  Pt then proceeded without cues to wash UB but was perseverating on UB. Pt even said I keep washing the same part over and over because I forgot I washed it Cued her to go from top to bottom (ie 1. Shoulders 2. Chest 3. Underarms, etc) She dried off independently and ambulated to bed to dress. Pt completed all self care without A.  No LOB and good awareness of environment in room.   Pt exhibited strong safety awareness.   Because of her improved balance and awareness, spoke with her PTA and we agreed pt could be mod ind in her room. Pt in room with all needs met.    Visit 2: No c/o pain  Pt received in room and ready for therapy. Pt ambulated to main gym and sat in chair at table to engage in various visual, perceptual, FMC, and strength tasks.  On confrontation testing, pt did not demonstrate R visual field cut.  She did well  with all visual tests.  Perceptually it took her a significant amount of time and cues to process the instructions for the perceptual task of copying a peg board puzzle, but then pt able to do the task.  Her UE strength gains have improved to WNL.   Pt ambulated back to room with all needs met.   Therapy Documentation Precautions:  Precautions Precautions: Fall Recall of Precautions/Restrictions: Intact Restrictions Weight Bearing Restrictions Per Provider Order: No      ADL: ADL Eating: Independent Grooming: Independent Upper Body Bathing: Setup Where Assessed-Upper Body Bathing: Shower Lower Body Bathing: Setup Where Assessed-Lower Body Bathing: Shower Upper Body Dressing: Independent Where Assessed-Upper Body Dressing: Edge of bed Lower Body Dressing: Modified independent Where Assessed-Lower Body Dressing: Edge of bed Toileting: Independent Where Assessed-Toileting: Teacher, Adult Education: Community Education Officer Method: Proofreader: Acupuncturist: Close supervision Film/video Editor Method: Designer, Industrial/product: Emergency planning/management officer, Grab bars    Therapy/Group: Individual Therapy  Labrian Torregrossa 09/21/2024, 12:31 PM

## 2024-09-21 NOTE — Plan of Care (Signed)
  Problem: RH Balance Goal: LTG: Patient will maintain dynamic sitting balance (OT) Description: LTG:  Patient will maintain dynamic sitting balance with assistance during activities of daily living (OT) Outcome: Completed/Met Goal: LTG Patient will maintain dynamic standing with ADLs (OT) Description: LTG:  Patient will maintain dynamic standing balance with assist during activities of daily living (OT)  Outcome: Completed/Met   Problem: Sit to Stand Goal: LTG:  Patient will perform sit to stand in prep for activites of daily living with assistance level (OT) Description: LTG:  Patient will perform sit to stand in prep for activites of daily living with assistance level (OT) Outcome: Completed/Met   Problem: RH Bathing Goal: LTG Patient will bathe all body parts with assist levels (OT) Description: LTG: Patient will bathe all body parts with assist levels (OT) Outcome: Completed/Met   Problem: RH Dressing Goal: LTG Patient will perform upper body dressing (OT) Description: LTG Patient will perform upper body dressing with assist, with/without cues (OT). Outcome: Completed/Met Goal: LTG Patient will perform lower body dressing w/assist (OT) Description: LTG: Patient will perform lower body dressing with assist, with/without cues in positioning using equipment (OT) Outcome: Completed/Met   Problem: RH Toileting Goal: LTG Patient will perform toileting task (3/3 steps) with assistance level (OT) Description: LTG: Patient will perform toileting task (3/3 steps) with assistance level (OT)  Outcome: Completed/Met   Problem: RH Light Housekeeping Goal: LTG Patient will perform light housekeeping w/assist (OT) Description: LTG: Patient will perform light housekeeping with assistance, with/without cues (OT). Outcome: Completed/Met   Problem: RH Toilet Transfers Goal: LTG Patient will perform toilet transfers w/assist (OT) Description: LTG: Patient will perform toilet transfers with  assist, with/without cues using equipment (OT) Outcome: Completed/Met   Problem: RH Tub/Shower Transfers Goal: LTG Patient will perform tub/shower transfers w/assist (OT) Description: LTG: Patient will perform tub/shower transfers with assist, with/without cues using equipment (OT) Outcome: Completed/Met   Problem: RH Memory Goal: LTG Patient will demonstrate ability for day to day recall/carry over during activities of daily living with assistance level (OT) Description: LTG:  Patient will demonstrate ability for day to day recall/carry over during activities of daily living with assistance level (OT). Outcome: Adequate for Discharge

## 2024-09-21 NOTE — Plan of Care (Signed)
  Problem: RH Cognition - SLP Goal: RH LTG Patient will demonstrate orientation with cues Description:  LTG:  Patient will demonstrate orientation to person/place/time/situation with cues (SLP)   Outcome: Completed/Met   Problem: RH Comprehension Communication Goal: LTG Patient will comprehend basic/complex auditory (SLP) Description: LTG: Patient will comprehend basic/complex auditory information with cues (SLP). Outcome: Completed/Met   Problem: RH Expression Communication Goal: LTG Patient will express needs/wants via multi-modal(SLP) Description: LTG:  Patient will express needs/wants via multi-modal communication (gestures/written, etc) with cues (SLP) Outcome: Completed/Met   Problem: RH Attention Goal: LTG Patient will demonstrate this level of attention during functional activites (SLP) Description: LTG:  Patient will will demonstrate this level of attention during functional activites (SLP) Outcome: Completed/Met   Problem: RH Expression Communication Goal: LTG Patient will increase word finding of common (SLP) Description: LTG:  Patient will increase word finding of common objects/daily info/abstract thoughts with cues using compensatory strategies (SLP). Outcome: Not Met (add Reason)

## 2024-09-21 NOTE — Progress Notes (Signed)
 Physical Therapy Session Note  Patient Details  Name: Rhonda Cortez MRN: [id number] Date of Birth: Sep 04, 1961  Today's Date: 09/21/2024 PT Individual Time: 8692-8584 PT Individual Time Calculation (min): 68 min   Short Term Goals: Week 2:  PT Short Term Goal 1 (Week 2): STG = LTG d/t ELOS  Skilled Therapeutic Interventions/Progress Updates: Patient semi-reclined in be on entrance to room. Patient alert and agreeable to PT session.   Patient reported no pain. See d/c for summary.   Therapeutic Activity: Bed Mobility: Pt performed supine<>sit on EOB independently. Transfers: Pt performed sit<>stand transfers throughout session independently. Pt ambulated from room<>day room gym without AD and with supervision and VC to increase awareness to R side with pt providing intellectual awareness when asked what some things are needed to be kept in mind. Pt stated need to be aware of environment and to pay more attention to R.   TUG (avg = 11.26s) without AD and with supervision for safety. Pt has improved pivot step clearance/length and overall safety since previous time doing TUG.  - 11.90s  - 11.17s  - 10.72s  Neuromuscular Re-ed: NMR facilitated during session with focus on proprioception. - Pt performed retro-step short distance with multimodal cue on performing toe to heel vs flat foot with notable improvement in dynamic standing stability. Pt then ambulated backwards around nsg/day room loop with CGA and one moment at end that required minA to prevent posterior LOB with pt requiring hinted cue on how to correct. Pt required VC throughout to increase B step length and to perform toe to heel.   NMR performed for improvements in motor control and coordination, balance, sequencing, judgement, and self confidence/ efficacy in performing all aspects of mobility at highest level of independence.   Patient sitting in chair at end of session with brakes locked, nsg present and all needs within  reach.      Therapy Documentation Precautions:  Precautions Precautions: Fall Recall of Precautions/Restrictions: Intact Restrictions Weight Bearing Restrictions Per Provider Order: No   Therapy/Group: Individual Therapy  Naysha Sholl PTA 09/21/2024, 3:41 PM

## 2024-09-21 NOTE — Progress Notes (Signed)
 Inpatient Rehabilitation Care Coordinator Discharge Note   Patient Details  Name: Rhonda Cortez MRN: [id number] Date of Birth: 1960-11-19   Discharge location: HOME WITH FAMILY TO ASSIST WITH HER CARE-AWARE 24/7 SUPERVISION  Length of Stay: 13 DAYS  Discharge activity level: SUPERVISION-CGA LEVEL  Home/community participation: ACTIVE  Patient response un:Yzjouy Literacy - How often do you need to have someone help you when you read instructions, pamphlets, or other written material from your doctor or pharmacy?: Rarely  Patient response un:Dnrpjo Isolation - How often do you feel lonely or isolated from those around you?: Rarely  Services provided included: MD, RD, PT, OT, SLP, RN, CM, TR, Pharmacy, Neuropsych, SW  Financial Services:  Field Seismologist Utilized: Private Insurance [employer]  Choices offered to/list presented to: SISTER AND PT  Follow-up services arranged:  Outpatient    Outpatient Servicies: [facility] [address]  PT  OT  SP  WILL CALL TO MAKE FOLLOW UP APPOINTMENTS    NO EQUIPMENT NEEDS  Patient response to transportation need: Is the patient able to respond to transportation needs?: Yes In the past 12 months, has lack of transportation kept you from medical appointments or from getting medications?: No In the past 12 months, has lack of transportation kept you from meetings, work, or from getting things needed for daily living?: No   Patient/Family verbalized understanding of follow-up arrangements:  Yes  Individual responsible for coordination of the follow-up plan: IRISH-SISTER [phone]  Confirmed correct DME delivered: [name] MATSU 09/21/2024    Comments (or additional information): SISTER AND SON WERE HERE FOR EDUCATION AND SAW THE AMOUNT OF CARE PT REQUIRES AT DISCHARGE. AWARE OF NEED FOR 24/7 CARE FOR SAFETY AND CUES  Summary of Stay    Date/Time Discharge Planning CSW  09/15/24 [phone] Home with family coming  in to assist-made aware will need 24/7 supervision for safety and needing cues. Await team's recommendations and set up family education RGD       Shela Esses G

## 2024-09-21 NOTE — Progress Notes (Signed)
 PROGRESS NOTE   Subjective/Complaints:  Per OT, family training went well, multiple caregivers present  Pt thinks her d/c date was today, clarified that it is tomorrow   Vitals stable      09/20/2024    8:51 PM 09/20/2024    2:02 PM 09/20/2024    4:39 AM  Vitals with BMI  Systolic 123 132 885  Diastolic 83 69 70  Pulse 87 88 78    No results for input(s): GLUCAP in the last 72 hours.      ROS: Limited due to cognitive/behavioral   Objective:   No results found.  Recent Labs    09/20/24 0524  WBC 9.8  HGB 12.4  HCT 37.3  PLT 376    Recent Labs    09/19/24 1558 09/20/24 0524  NA 139 141  K 3.9 4.1  CL 109 111  CO2 22 19*  GLUCOSE 106* 101*  BUN 24* 23  CREATININE 2.03* 2.05*  CALCIUM  9.0 9.2     Intake/Output Summary (Last 24 hours) at 09/21/2024 0847 Last data filed at 09/21/2024 0800 Gross per 24 hour  Intake 840 ml  Output --  Net 840 ml         Physical Exam: Vital Signs Blood pressure 123/83, pulse 87, temperature 98.7 F (37.1 C), temperature source Oral, resp. rate 18, height 5' 3 (1.6 m), weight 63 kg, SpO2 100%.    General: No acute distress.  Sitting upright in bed. Mood and affect are appropriate Heart: Regular rate and rhythm no rubs murmurs or extra sounds Lungs: Clear to auscultation, breathing unlabored, no rales or wheezes Abdomen: Positive bowel sounds, soft nontender to palpation, nondistended Extremities: No clubbing, cyanosis, or edema Skin: No evidence of breakdown, no evidence of rash Neurologic:  Awake, alert, oriented to self and place; not to time. + Cognitive delay, + Language: Dysarthria mild, hypophonic, minimal spontaneous speech  Cranial nerves II through XII intact, motor strength is 5/5 in bilateral deltoid, bicep, tricep, grip, hip flexor, knee extensors, ankle dorsiflexor and plantar flexor Oriented to person and hospital but not city   Musculoskeletal: Full range of motion in all 4 extremities. No joint swelling    Assessment/Plan: 1. Functional deficits which require 3+ hours per day of interdisciplinary therapy in a comprehensive inpatient rehab setting. Physiatrist is providing close team supervision and 24 hour management of active medical problems listed below. Physiatrist and rehab team continue to assess barriers to discharge/monitor patient progress toward functional and medical goals  Care Tool:  Bathing    Body parts bathed by patient: Right arm, Left arm, Chest, Abdomen, Front perineal area, Buttocks, Right upper leg, Left upper leg, Right lower leg, Left lower leg, Face         Bathing assist Assist Level: Contact Guard/Touching assist     Upper Body Dressing/Undressing Upper body dressing   What is the patient wearing?: Pull over shirt    Upper body assist Assist Level: Supervision/Verbal cueing    Lower Body Dressing/Undressing Lower body dressing      What is the patient wearing?: Underwear/pull up, Pants     Lower body assist Assist for lower body dressing: Minimal Assistance -  Patient > 75%     Editor, Commissioning assist Assist for toileting: Supervision/Verbal cueing     Transfers Chair/bed transfer  Transfers assist     Chair/bed transfer assist level: Supervision/Verbal cueing     Locomotion Ambulation   Ambulation assist      Assist level: Supervision/Verbal cueing Assistive device: No Device Max distance: 150   Walk 10 feet activity   Assist     Assist level: Supervision/Verbal cueing Assistive device: No Device   Walk 50 feet activity   Assist    Assist level: Supervision/Verbal cueing Assistive device: No Device    Walk 150 feet activity   Assist Walk 150 feet activity did not occur: Safety/medical concerns (endurnace)  Assist level: Supervision/Verbal cueing Assistive device: No Device    Walk 10 feet on uneven surface   activity   Assist     Assist level: Supervision/Verbal cueing Assistive device: Other (comment) (no device)   Wheelchair     Assist Is the patient using a wheelchair?: Yes Type of Wheelchair: Manual    Wheelchair assist level: Dependent - Patient 0%      Wheelchair 50 feet with 2 turns activity    Assist        Assist Level: Dependent - Patient 0%   Wheelchair 150 feet activity     Assist      Assist Level: Dependent - Patient 0%   Blood pressure 123/83, pulse 87, temperature 98.7 F (37.1 C), temperature source Oral, resp. rate 18, height 5' 3 (1.6 m), weight 63 kg, SpO2 100%.   Medical Problem List and Plan: 1. 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  Antithrombotics: -DVT/anticoagulation:  Mechanical: Sequential compression devices, below knee Bilateral lower extremities  -dopplers reviewed and negative Pharmaceutical: Heparin  ambulation distance is greater than 100 feet will discontinue heparin              -antiplatelet therapy: continue Aspirin  81 mg --No DAPT due to recent ICH   3. Pain Management:con't tylenol  as needed Add Topirimate for HA    4. Mood/Behavior/Sleep: LCSW to follow for evaluation and support when available.              -antipsychotic agents: N/A              -sleep patterns better   5. Neuropsych/cognition: This patient is not capable of making decisions on her own behalf.   6. Skin/Wound Care: Routine pressure relief measures.    7. AKI on CKD: finished IVF. Baseline Cr 1.6-1.9.        Latest Ref Rng & Units 09/20/2024    5:24 AM 09/19/2024     3:58 PM 09/16/2024    5:22 AM  BMP  Glucose 70 - 99 mg/dL 898  893  890   BUN 8 - 23 mg/dL 23  24  25    Creatinine 0.44 - 1.00 mg/dL 7.94  7.96  7.72   Sodium  135 - 145 mmol/L 141  139  139   Potassium 3.5 - 5.1 mmol/L 4.1  3.9  4.7   Chloride 98 - 111 mmol/L 111  109  105   CO2 22 - 32 mmol/L 19  22  25    Calcium  8.9 - 10.3 mg/dL 9.2  9.0  9.5    Creat close  baseline , oral intake ~737mL per day would like to encourage intake to ~1561mL per day   9. Hx recent ICH: continue Lipitor 40 mg daily    10. Hx stroke/TIA: Stroke 2014-no significant residual deficit. Continue statin.    11. Seizure: Episode on 11/22, EEG negative for seizure activity. Loaded with Keppra , now on Vimpat  50 mg bid.    12. 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 Avoid low BP-Longterm BP goal 130-150 systolic given severe stenosis/occlusion of left MI and P1.              -  orthostatics improved. Continue to monitor    13.Hx of HTN: continue home regime- Norvac and Valsartan .    not on ACE-I, will reduce  14. HLD: was on Pitavastatin  at home, continue Atorvastatin  40 mg daily.    15.  Fever/leukocytosis leukocytosis resolved  -Repeat Chest xray/urinalysis/Blood cultures and venous doppler and CT abd/pelvis d/t recent ICH.     11/27- CT- went over with pt/family- looks OK except constipation and subcentimeter hypodensity in liver  12/1 WBCs normal    -UCX with diptheroids-d/c keflex  , has completed 5 d course   -Blood cx NGTD 3days       Latest Ref Rng & Units 09/20/2024    5:24 AM 09/16/2024    5:22 AM 09/13/2024    5:01 AM  CBC  WBC 4.0 - 10.5 K/uL 9.8  7.7  8.4   Hemoglobin 12.0 - 15.0 g/dL 87.5  86.7  86.9   Hematocrit 36.0 - 46.0 % 37.3  39.6  38.7   Platelets 150 - 400 K/uL 376  488  464     16. Tachycardia improved on Toprol  XL but BPs on low side will reduce amlodipine  and check Ortho vitals    Vitals:   09/20/24 1402 09/20/24  2051  BP: 132/69 123/83  Pulse: 88 87  Resp: 16 18  Temp: 98.3 F (36.8 C) 98.7 F (37.1 C)  SpO2: 100% 100%  Reduced amlodipine  to 5mg  starting 12/2 in desired range, HR still a little high but will not adjust toprol  at this time    - 12/6-7: normotensive   18. Constipation  11/27- LBM after sorbitol --needs to eat more!  11/30 add senna-s bid   LBM 12/1 - PO intakes appear OK - add miralax  daily  Incont BM 12/5- sorbitol  60mL x 1 today         LOS: 13 days A FACE TO FACE EVALUATION WAS PERFORMED  Prentice FORBES Compton 09/21/2024, 8:47 AM

## 2024-09-22 ENCOUNTER — Other Ambulatory Visit (HOSPITAL_COMMUNITY): Payer: Self-pay

## 2024-09-22 NOTE — Progress Notes (Signed)
 PROGRESS NOTE   Subjective/Complaints:  Aware of d/c today  Asking when her memory will come back, discussed potential for improvement over the next 64mo   Vitals stable      09/22/2024    5:18 AM 09/21/2024    8:30 PM 09/20/2024    8:51 PM  Vitals with BMI  Weight 113 lbs 5 oz    BMI 20.08    Systolic 127 132 876  Diastolic 85 77 83  Pulse 80 81 87    No results for input(s): GLUCAP in the last 72 hours.      ROS: Limited due to cognitive/behavioral   Objective:   No results found.  Recent Labs    09/20/24 0524  WBC 9.8  HGB 12.4  HCT 37.3  PLT 376    Recent Labs    09/19/24 1558 09/20/24 0524  NA 139 141  K 3.9 4.1  CL 109 111  CO2 22 19*  GLUCOSE 106* 101*  BUN 24* 23  CREATININE 2.03* 2.05*  CALCIUM  9.0 9.2     Intake/Output Summary (Last 24 hours) at 09/22/2024 0829 Last data filed at 09/21/2024 1800 Gross per 24 hour  Intake 480 ml  Output --  Net 480 ml         Physical Exam: Vital Signs Blood pressure 127/85, pulse 80, temperature 98.2 F (36.8 C), temperature source Oral, resp. rate 18, height 5' 3 (1.6 m), weight 51.4 kg, SpO2 100%.    General: No acute distress.  Sitting upright in bed. Mood and affect are appropriate Heart: Regular rate and rhythm no rubs murmurs or extra sounds Lungs: Clear to auscultation, breathing unlabored, no rales or wheezes Abdomen: Positive bowel sounds, soft nontender to palpation, nondistended Extremities: No clubbing, cyanosis, or edema Skin: No evidence of breakdown, no evidence of rash Neurologic:  Awake, alert, oriented to self and place; not to time. + Cognitive delay, + Language: Dysarthria mild, hypophonic, minimal spontaneous speech  Cranial nerves II through XII intact, motor strength is 5/5 in bilateral deltoid, bicep, tricep, grip, hip flexor, knee extensors, ankle dorsiflexor and plantar flexor Oriented to person and hospital  but not city  Musculoskeletal: Full range of motion in all 4 extremities. No joint swelling    Assessment/Plan: 1. Functional deficits due to ICH   Care Tool:  Bathing    Body parts bathed by patient: Right arm, Left arm, Chest, Abdomen, Front perineal area, Buttocks, Right upper leg, Left upper leg, Right lower leg, Left lower leg, Face         Bathing assist Assist Level: Set up assist     Upper Body Dressing/Undressing Upper body dressing   What is the patient wearing?: Pull over shirt    Upper body assist Assist Level: Independent    Lower Body Dressing/Undressing Lower body dressing      What is the patient wearing?: Underwear/pull up, Pants     Lower body assist Assist for lower body dressing: Independent with assitive device     Toileting Toileting    Toileting assist Assist for toileting: Independent     Transfers Chair/bed transfer  Transfers assist     Chair/bed transfer assist  level: Independent     Locomotion Ambulation   Ambulation assist      Assist level: Supervision/Verbal cueing Assistive device: No Device Max distance: 150   Walk 10 feet activity   Assist     Assist level: Supervision/Verbal cueing Assistive device: No Device   Walk 50 feet activity   Assist    Assist level: Supervision/Verbal cueing Assistive device: No Device    Walk 150 feet activity   Assist Walk 150 feet activity did not occur: Safety/medical concerns (endurnace)  Assist level: Supervision/Verbal cueing Assistive device: No Device    Walk 10 feet on uneven surface  activity   Assist     Assist level: Supervision/Verbal cueing Assistive device: Other (comment) (no device)   Wheelchair     Assist Is the patient using a wheelchair?: No Type of Wheelchair: Manual    Wheelchair assist level: Dependent - Patient 0%      Wheelchair 50 feet with 2 turns activity    Assist        Assist Level: Dependent - Patient  0%   Wheelchair 150 feet activity     Assist      Assist Level: Dependent - Patient 0%   Blood pressure 127/85, pulse 80, temperature 98.2 F (36.8 C), temperature source Oral, resp. rate 18, height 5' 3 (1.6 m), weight 51.4 kg, SpO2 100%.   Medical Problem List and Plan: 1. 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  Antithrombotics: -DVT/anticoagulation:  Mechanical: Sequential compression devices, below knee Bilateral lower extremities  -dopplers reviewed and negative              -antiplatelet therapy: continue Aspirin  81 mg --No DAPT due to recent ICH   3. Pain Management:con't tylenol  as needed Add Topirimate for HA    4. Mood/Behavior/Sleep: LCSW to follow for evaluation and support when available.              -antipsychotic agents: N/A              -sleep patterns better   5. Neuropsych/cognition: This patient is not capable of making decisions on her own behalf.   6. Skin/Wound Care: Routine pressure relief measures.    7. AKI on CKD: finished IVF. Baseline Cr 1.6-1.9.        Latest Ref Rng & Units 09/20/2024    5:24 AM 09/19/2024    3:58 PM 09/16/2024    5:22 AM  BMP  Glucose 70 - 99 mg/dL 898  893  890   BUN 8 - 23 mg/dL 23  24  25    Creatinine 0.44 - 1.00 mg/dL 7.94  7.96  7.72   Sodium 135 - 145 mmol/L 141  139  139   Potassium 3.5 - 5.1 mmol/L 4.1  3.9  4.7   Chloride 98 - 111 mmol/L 111  109  105   CO2 22 - 32 mmol/L 19  22  25    Calcium  8.9 - 10.3 mg/dL 9.2  9.0  9.5    Creat close  baseline , oral intake ~730mL per day would like to encourage intake to ~1568mL per day   9. Hx recent ICH: continue Lipitor 40 mg daily    10. Hx stroke/TIA: Stroke 2014-no significant residual deficit. Continue statin.  11. Seizure: Episode on 11/22, EEG negative for seizure activity. Loaded with Keppra , now on Vimpat  50 mg bid.    12. 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 Avoid low BP-Longterm BP goal 130-150 systolic given severe stenosis/occlusion of left MI and P1.              -  orthostatics improved. Continue to monitor    13.Hx of HTN: continue home regime- Norvac and Valsartan .    not on ACE-I, will reduce  14. HLD: was on Pitavastatin  at home, continue Atorvastatin  40 mg daily.    15.  Fever/leukocytosis leukocytosis resolved  -Repeat Chest xray/urinalysis/Blood cultures and venous doppler and CT abd/pelvis d/t recent ICH.     11/27- CT- went over with pt/family- looks OK except constipation and subcentimeter hypodensity in liver  12/1 WBCs normal    -UCX with diptheroids-d/c keflex  , has completed 5 d course   -Blood cx NGTD 3days       Latest Ref Rng & Units 09/20/2024    5:24 AM 09/16/2024    5:22 AM 09/13/2024    5:01 AM  CBC  WBC 4.0 - 10.5 K/uL 9.8  7.7  8.4   Hemoglobin 12.0 - 15.0 g/dL 87.5  86.7  86.9   Hematocrit 36.0 - 46.0 % 37.3  39.6  38.7   Platelets 150 - 400 K/uL 376  488  464     16. Tachycardia improved on Toprol  XL but BPs on low side will reduce amlodipine  and check Ortho vitals    Vitals:   09/21/24 2030 09/22/24 0518  BP: 132/77 127/85  Pulse: 81 80  Resp: 18 18  Temp: 98 F (36.7 C) 98.2 F (36.8 C)  SpO2: 99% 100%  Reduced amlodipine  to 5mg  starting 12/2 in desired range, HR still a little high but will not adjust toprol  at this time    - 12/6-7: normotensive   18. Constipation  Cont senna S BID and Miralax  daily         LOS: 14 days A FACE TO FACE EVALUATION WAS PERFORMED  Prentice FORBES Compton 09/22/2024, 8:29 AM

## 2024-09-22 NOTE — Discharge Summary (Signed)
 Physician Discharge Summary  Patient ID: Rhonda Cortez MRN: [id number] DOB/AGE: [birth year] 63 y.o.  Admit date: 09/08/2024 Discharge date: 09/24/2024  Discharge Diagnoses:  Principal Problem:   Acute ischemic left PCA stroke [facility]) Active Problems:   Essential (primary) hypertension   Gastroesophageal reflux disease without esophagitis   Constipation   Mixed hyperlipidemia   ICH (intracerebral hemorrhage) (HCC)   Seizure (HCC)   Chronic kidney disease, stage 3b (HCC)   Acute CVA (cerebrovascular accident) (HCC)   Cognitive and neurobehavioral dysfunction   Discharged Condition: stable  Significant Diagnostic Studies: VAS US  LOWER EXTREMITY VENOUS (DVT) Result Date: 09/10/2024  Lower Venous DVT Study Patient Name:  Rhonda Cortez  Date of Exam:   09/09/2024 Medical Rec #: [id number]    Accession #:    [id number] Date of Birth: [birth year]    Patient Gender: F Patient Age:   63 years Exam Location:  [place] Procedure:      VAS US  LOWER EXTREMITY VENOUS (DVT) Referring Phys: DAPHNE SATTERFIELD --------------------------------------------------------------------------------  Indications: Fever. Other Indications: Rehab patient. Comparison Study: No previous exams Performing Technologist: Jody Hill RVT, RDMS  Examination Guidelines: A complete evaluation includes B-mode imaging, spectral Doppler, color Doppler, and power Doppler as needed of all accessible portions of each vessel. Bilateral testing is considered an integral part of a complete examination. Limited examinations for reoccurring indications may be performed as noted. The reflux portion of the exam is performed with the patient in reverse Trendelenburg.  +---------+---------------+---------+-----------+----------+--------------+ RIGHT    CompressibilityPhasicitySpontaneityPropertiesThrombus Aging +---------+---------------+---------+-----------+----------+--------------+ CFV      Full           Yes      Yes                                  +---------+---------------+---------+-----------+----------+--------------+ SFJ      Full                                                        +---------+---------------+---------+-----------+----------+--------------+ FV Prox  Full           Yes      Yes                                 +---------+---------------+---------+-----------+----------+--------------+ FV Mid   Full           Yes      Yes                                 +---------+---------------+---------+-----------+----------+--------------+ FV DistalFull           Yes      Yes                                 +---------+---------------+---------+-----------+----------+--------------+ PFV      Full                                                        +---------+---------------+---------+-----------+----------+--------------+  POP      Full           Yes      Yes                                 +---------+---------------+---------+-----------+----------+--------------+ PTV      Full                                                        +---------+---------------+---------+-----------+----------+--------------+ PERO     Full                                                        +---------+---------------+---------+-----------+----------+--------------+   +---------+---------------+---------+-----------+----------+--------------+ LEFT     CompressibilityPhasicitySpontaneityPropertiesThrombus Aging +---------+---------------+---------+-----------+----------+--------------+ CFV      Full           Yes      Yes                                 +---------+---------------+---------+-----------+----------+--------------+ SFJ      Full                                                        +---------+---------------+---------+-----------+----------+--------------+ FV Prox  Full           Yes      Yes                                  +---------+---------------+---------+-----------+----------+--------------+ FV Mid   Full           Yes      Yes                                 +---------+---------------+---------+-----------+----------+--------------+ FV DistalFull           Yes                                          +---------+---------------+---------+-----------+----------+--------------+ PFV      Full                                                        +---------+---------------+---------+-----------+----------+--------------+ POP      Full           Yes      Yes                                 +---------+---------------+---------+-----------+----------+--------------+ PTV  Full                                                        +---------+---------------+---------+-----------+----------+--------------+ PERO     Full                                                        +---------+---------------+---------+-----------+----------+--------------+     Summary: BILATERAL: - No evidence of deep vein thrombosis seen in the lower extremities, bilaterally. -No evidence of popliteal cyst, bilaterally.   *See table(s) above for measurements and observations. Electronically signed by Norman Serve on 09/10/2024 at 7:14:51 AM.    Final    CT ABDOMEN PELVIS WO CONTRAST Result Date: 09/09/2024 EXAM: CT ABDOMEN AND PELVIS WITHOUT CONTRAST 09/09/2024 05:21:13 AM TECHNIQUE: CT of the abdomen and pelvis was performed without the administration of intravenous contrast. Multiplanar reformatted images are provided for review. Automated exposure control, iterative reconstruction, and/or weight-based adjustment of the mA/kV was utilized to reduce the radiation dose to as low as reasonably achievable. COMPARISON: None available. CLINICAL HISTORY: Sepsis FINDINGS: LOWER CHEST: Lung bases are clear of infiltrates with asymmetric posterior atelectasis in the right lower lobe. There is mild elevation in the  right hemidiaphragm. The cardiac size is normal. LIVER: The liver is 18 cm in length with mild steatosis. No masses seen without contrast. There is a 5 mm too small to characterize hypodensity in segment 7 on axial image 12 of series 3. No follow-up imaging is needed in a low-risk patient. In a high-risk patient, consider 38-month follow-up liver-dedicated MRI. The remaining liver is homogeneous. GALLBLADDER AND BILE DUCTS: Gallbladder is unremarkable. No biliary ductal dilatation. SPLEEN: No acute abnormality. PANCREAS: No acute abnormality. ADRENAL GLANDS: There is no adrenal mass. KIDNEYS, URETERS AND BLADDER: There is a 4.7 cm Bosniak type 1 cyst in the superior pole of the right kidney, Hounsfield density of 17. There is scarring in the right lower pole. No follow-up imaging is recommended. The remainder of the unenhanced kidneys are unremarkable. There is no urinary stone or obstruction. No perinephric or periureteral stranding. Urinary bladder is unremarkable. GI AND BOWEL: The stomach is contracted and unremarkable accounting for contraction. There is no small bowel obstruction or inflammation. An appendix is not seen. There is mild fecal stasis. No evidence of colitis or diverticulitis. PERITONEUM AND RETROPERITONEUM: There is no free fluid, free hemorrhage, free air, or localizing inflammatory process. VASCULATURE: Aorta is normal in caliber. LYMPH NODES: No lymphadenopathy. REPRODUCTIVE ORGANS: There are multiple pelvic phleboliths. Lobulated fibroid uterus with 2 cm calcified fibroid in the body of the uterus to the left. The ovaries are not enlarged. BONES AND SOFT TISSUES: There is subcutaneous fatty stranding in the lower anterior wall consistent with prior subcutaneous injections. There are mild degenerative changes of the lumbar spine. No acute osseous abnormality. IMPRESSION: 1. No septic source identified with noncontrast CT. 2. Subcentimeter hypodensity in the liver. No follow up imaging  recommended in a low risk patient; in a high risk patient, 53-month follow-up a liver-dedicated MRI would be recommended. 3. Constipation. 4. Fibroid uterus. Electronically signed by: Francis Quam MD 09/09/2024 06:15 AM EST RP Workstation: [id number]  DG Chest 2 View Result Date: 09/08/2024 EXAM: 2 VIEW(S) XRAY OF THE CHEST 09/08/2024 06:06:00 PM COMPARISON: 09/03/2024 CLINICAL HISTORY: Fever. FINDINGS: LUNGS AND PLEURA: No focal pulmonary opacity. No pleural effusion. No pneumothorax. HEART AND MEDIASTINUM: No acute abnormality of the cardiac and mediastinal silhouettes. BONES AND SOFT TISSUES: No acute osseous abnormality. IMPRESSION: 1. No acute cardiopulmonary process. Electronically signed by: Franky Crease MD 09/08/2024 10:17 PM EST RP Workstation: [id number]   MR BRAIN WO CONTRAST Result Date: 09/06/2024 EXAM: MRI BRAIN WITHOUT CONTRAST 09/06/2024 11:47:55 AM TECHNIQUE: Multiplanar multisequence MRI of the head/brain was performed without the administration of intravenous contrast. COMPARISON: Head CT and CTA head and neck 09/04/2024. Brain MRI 08/29/2024. CLINICAL HISTORY: 63 year old female with left caudate hemorrhage extending into the ventricular system and recurrent code stroke presentation 2 days ago. FINDINGS: BRAIN AND VENTRICLES: There is gyriform and nodular abnormal diffusion in the posterior left hemisphere affecting both the left PCA territory (including posterior left temporal lobe on series 2 image 21) and the posterior left MCA / PCA watershed (left parietal lobe series 3 image 10). These areas are restricted, and demonstrate gyriform T2 and FLAIR hyperintense cytotoxic edema. No evidence of hemorrhagic transformation. No other acute ischemia identified. Intraventricular blood, dominant in the left lateral ventricle, does not appear progressed since the previous MRI. Residual left caudate intra axial blood products with expected evolution. SWI demonstrates multiple superimposed chronic  microhemorrhages scattered in the bilateral deep gray nuclei, in the left deep cerebellar nucleus, and occasionally elsewhere in the cerebral and cerebellar hemispheres. Mild superimposed superficial siderosis also. The pattern more resembles ordinary chronic small vessel disease than amyloid angiopathy at this time. Blood product susceptibility on diffusion imaging. No significant midline shift. Borderline to mild ventriculomegaly and possible mild associated transependymal edema (series 6 image 17) are not progressed. The sella is unremarkable. Normal flow voids. Basilar cisterns remain patent. No increased intracranial mass effect. ORBITS: No acute abnormality. SINUSES AND MASTOIDS: Paranasal sinuses and mastoids are well aerated. BONES AND SOFT TISSUES: Normal marrow signal. No acute soft tissue abnormality. Stable and negative visible cervical spine and bone marrow signal. IMPRESSION: 1. Acute infarcts in the Left PCA and adjacent MCA/PCA watershed territories with mild cytotoxic edema. No acute hemorrhagic transformation. 2. Slowing regressing prior left caudate and intraventricular hemorrhage, without progression from earlier this month. Borderline to mild ventriculomegaly and transependymal edema are stable. 3. No significant intracranial mass effect, midline shift. 4. Numerous chronic microhemorrhages and mild superficial siderosis, but pattern currently favoring ordinary small vessel disease over amyloid angiopathy. Electronically signed by: Helayne Hurst MD 09/06/2024 11:57 AM EST RP Workstation: [id number]   Overnight EEG with video Result Date: 09/04/2024 Shelton Arlin KIDD, MD     09/06/2024  8:20 AM Patient Name: Rhonda Cortez MRN: [id number] Epilepsy Attending: Arlin KIDD Shelton Referring Physician/Provider: Judithe Rocky BROCKS, NP Duration: 09/04/2024 1557 to 09/05/2024 1234 Patient history: 63 y.o. female who presents with confusion and found to have a small head of the caudate hemorrhage. Again patient  becoming unresponsive and not following commands. EEG to evaluate for seizure Level of alertness: Awake, asleep AEDs during EEG study: LEV Technical aspects: This EEG study was done with scalp electrodes positioned according to the 10-20 International system of electrode placement. Electrical activity was reviewed with band pass filter of 1-70Hz , sensitivity of 7 uV/mm, display speed of 61mm/sec with a 60Hz  notched filter applied as appropriate. EEG data were recorded continuously and digitally stored.  Video monitoring was available and reviewed as appropriate.  Description: The posterior dominant rhythm consists of 8-9 Hz activity of moderate voltage (25-35 uV) seen predominantly in posterior head regions, symmetric and reactive to eye opening and eye closing. Sleep was characterized by vertex waves, sleep spindles (12 to 14 Hz), maximal frontocentral region. There is continuous 3 to 6 Hz theta-delta slowing in left temporal region. Hyperventilation and photic stimulation were not performed.   ABNORMALITY - Continuous slow, left temporal IMPRESSION: This study is suggestive of cortical dysfunction arising from left temporal region likely secondary to underlying structural abnormality. No seizures or epileptiform discharges were seen throughout the recording. Priyanka O Yadav   CT ANGIO HEAD NECK W WO CM (CODE STROKE) Result Date: 09/04/2024 EXAM: CTA Head and Neck with Intravenous Contrast. CT Head without Contrast. CLINICAL HISTORY: Neuro deficit, acute, stroke suspected. TECHNIQUE: Axial CTA images of the head and neck performed with and without intravenous contrast. MIP reconstructed images were created and reviewed. Axial computed tomography images of the head/brain performed without intravenous contrast. Note: Per PQRS, the description of internal carotid artery percent stenosis, including 0 percent or normal exam, is based on [employer] American Symptomatic Carotid Endarterectomy Trial (NASCET) criteria. Dose  reduction technique was used including one or more of the following: automated exposure control, adjustment of mA and kV according to patient size, and/or iterative reconstruction. CONTRAST: Without and with; 75 mL (iohexol  (OMNIPAQUE ) 350 MG/ML injection 75 mL IOHEXOL  350 MG/ML SOLN). COMPARISON: MR head without contrast and MRA head without contrast 08/29/2024. FINDINGS: CT HEAD: BRAIN: No acute intraparenchymal hemorrhage. No mass lesion. No CT evidence for acute territorial infarct. No midline shift or extra-axial collection. VENTRICLES: No hydrocephalus. ORBITS: The orbits are unremarkable. SINUSES AND MASTOIDS: The paranasal sinuses and mastoid air cells are clear. CTA NECK: COMMON CAROTID ARTERIES: No significant stenosis. No dissection or occlusion. INTERNAL CAROTID ARTERIES: Minimal atherosclerotic calcification was present at the right carotid bifurcation without significant stenosis. Minimal atherosclerotic changes are present in the proximal left ICA without significant stenosis. No dissection or occlusion. VERTEBRAL ARTERIES: The left vertebral artery is slightly dominant to the right. No significant stenosis. No dissection or occlusion. CTA HEAD: ANTERIOR CEREBRAL ARTERIES: Mild narrowing is present in the distal left A2 segment. No occlusion. No aneurysm. MIDDLE CEREBRAL ARTERIES: High-grade near occlusive stenosis is present in the distal left M1 segment. Asymmetric attenuation of left MCA branch vessels is noted. No aneurysm. POSTERIOR CEREBRAL ARTERIES: A high-grade, near occlusive stenosis is present in the proximal left P2 segment. Moderate tandem stenoses are present in the proximal right P2 segment and proximal right P3 segment. Distal PCA branch vessels are visualized on the right. No aneurysm. BASILAR ARTERY: Moderate stenosis is present in the mid basilar artery. No occlusion. No aneurysm. OTHER: SOFT TISSUES: No acute finding. No masses or lymphadenopathy. BONES: No acute osseous  abnormality. IMPRESSION: 1. High-grade, near occlusive stenosis in the distal left M1 segment and proximal left P2 segment. 2. Moderate tandem stenoses in the proximal right P2 segment and proximal right P3 segment. 3. 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 4. Mild narrowing in the distal left A2 segment. 5. Minimal atherosclerotic calcification at the right carotid bifurcation and minimal atherosclerotic changes in the proximal left ICA, both without significant stenosis. Electronically signed by: Lonni Necessary MD 09/04/2024 12:37 PM EST RP Workstation: [id number]     Labs:  Basic Metabolic Panel: Recent Labs  Lab 09/19/24 1558 09/20/24 0524  NA 139 141  K 3.9 4.1  CL 109 111  CO2 22 19*  GLUCOSE 106* 101*  BUN 24* 23  CREATININE 2.03* 2.05*  CALCIUM  9.0 9.2    CBC: Recent Labs  Lab 09/20/24 0524  WBC 9.8  NEUTROABS 5.4  HGB 12.4  HCT 37.3  MCV 89.7  PLT 376    CBG: No results for input(s): GLUCAP in the last 168 hours.  Brief HPI:   Rhonda Cortez is a 63 y.o. female  with PMHX of hypertension, HLD, and history of CVA in 2014 presented to [facility] on 08/27/2024.  Per chart review the patient was supposed to arrive at a friend's home around 7 PM that night but did not arrive.  When the patient answered her phone around 3 AM she was confused and had difficulty finding words.  In the ED a head CT showed acute intracranial hemorrhage from the left caudate region with extension of the intraventricular region on the left.  Neurology was consulted and the patient was admitted to the ICU.  A repeat CT head showed caudate head hemorrhage with intraventricular extension.  Patient underwent an MRI that confirmed intraventricular hemorrhage, no evidence of underlying mass. 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  Neurology recommended no antithrombotics due to ICH.  Patient was on aspirin  after stroke (2014)  for a number of years and then off. VTE prophylaxis SCDs. 2D echo with EF 60 to 65%.  Hospital course complicated by AKI, ongoing headaches, and new fever of unknown origin.    The patient was admitted to CIR on 11/20 and was doing well. She experienced sudden onset of right sided weakness with rigidity, decreased responsiveness, and not following commands. Code stroke was activated and the patient was transferred back to acute care on 11/22 for stroke workup. CT head showed resolving left caudate and left lateral ventricular hemorrhage without new hemorrhage.  CTA head and neck showing high-grade near occlusive stenosis in the proximal right P2 segment and proximal right P3 segment, moderate stenosis in the mid basilar artery, mild narrowing at the distal left A2 segment, and minimal atherosclerotic calcification at the right carotid bifurcation and minimal atherosclerotic changes in the proximal left ICA, both without significant stenosis.     Per neurology, patient was not a candidate for TNK or EVT due to no LVO noted on imaging and felt symptoms were concerning for seizure in the setting of recent ICH.  She was started on LTM EEG monitoring was given Keppra  1500 mg load,  maintenance Keppra  500 mg twice daily and transition to Vimpat  on 11/24.  EEG suggestive of cortical dysfunction arising from left temporal region likely secondary to underlying structural abnormality. No seizures or epileptiform discharges were seen. Repeat MRI brain was performed and showed acute infarcts involving the left PCA and adjacent MCA/PCA watershed territories with mild cytotoxic edema.  Neurology recommended aspirin  but no DAPT given ICH history.    Prior to arrival the patient was fully independent working and driving with no use of DME.  She lives alone in a 1 level home with 2 stairs to enter.  Patient currently requires contact-guard assist to min assist for mobility.Therapy evaluations completed due to patient decreased  functional mobility was admitted for a comprehensive rehab program. She currently has fever.    Inpatient Rehabilitation Course: Rhonda Cortez was admitted to rehab 09/08/2024 for inpatient therapies to consist of PT, ST and OT at least three hours five days a week. Past admission physiatrist,  therapy team and rehab RN have worked together to provide customized collaborative inpatient rehab.  The patient was admitted to inpatient rehab following functional deficits due to left PCA and MCA watershed territory infarcts, along with a left caudate ICH. She was maintained on sequential compression devices (SCDs) and aspirin  81 mg. Doppler studies were negative for deep vein thrombosis (DVT). Pain was managed with Tylenol  as needed, and headaches were treated with Topiramate . Her baseline creatinine ranged from 1.6 to 1.9. Oral intake was encouraged at 1500 mL per day. An EEG performed on September 04, 2024, was negative for seizure activity, and she was loaded with Keppra , continuing on Vimpat  50 mg twice daily. Blood pressure was monitored per protocol, and she continued her home regimen of Norvasc  and Valsartan . Orthostatic hypotension, likely caused by chronic L P2 occlusion in the setting of hypotension. Low BP avoided low, BP-Longterm goal 130-150 systolic given severe stenosis/occlusion of left MI and P1.   For hyperlipidemia, Pitavastatin  was switched to Atorvastatin  40 mg. A repeat chest X-ray, urinalysis, and blood cultures were performed due to fever and leukocytosis; urine culture showed diphtheroids. She completed a five-day course of Keflex . Blood cultures were negative for three days, and a CT scan was negative except for constipation and a subcentimeter hypodensity in the liver. WBC returned to normal, leukocytosis resolved, and no ongoing fevers were noted. Tachycardia improved with Toprolol. Constipation resolved with daily Senokot and MiraLAX .    Planned Outpatient Follow-Up:  -[facility] -PCP -PM&R     Rehab course: During patient's stay in rehab weekly team conferences were held to monitor patient's progress, set goals and discuss barriers to discharge. At admission, patient required contact-guard assist to min assist for mobility.    During therapies, Rhonda Cortez met all long-term goals with physical therapy (PT) and was discharged at an overall ambulatory level with supervision. She showed increased strength and improved balance in her right upper and lower extremities, benefiting from ongoing skilled PT services in the outpatient setting. In occupational therapy, she met 10 of 11 long-term goals, with improved balance, posterior control, and ability to compensate for deficits in functional use of her right lower extremity. She was discharged at an overall supervision level in her home environment and demonstrated modified independence with toileting and dressing, requiring setup assistance with showering. Family education was completed, and a home exercise plan was provided. Ongoing skilled OT services are recommended in an outpatient setting. In speech therapy, she met 4 of 5 long-term goals, requiring minimal cues for sustained attention and modified assistance for orientation. Ongoing outpatient speech therapy is recommended to maximize communication and cognition, thereby enhancing functional independence.    Discharge plan was discussed with patient and/or family member and they verbalized understanding and agreed with it.      Disposition: Discharge disposition: 01-Home or Self Care        Diet: Heart Healthy   Special Instructions:  -No driving or operating heavy machinery until cleared by provider  -No smoking or alcohol or illicit drug use    Discharge Instructions     Ambulatory referral to Neurology   Complete by: As directed    An appointment is requested in approximately: 6 weeks   Ambulatory referral to Occupational Therapy   Complete  by: As directed    Eval and treat   Ambulatory referral to Physical Medicine Rehab   Complete by: As directed    Ambulatory referral to Physical Therapy   Complete by: As directed  Eval and treat   Ambulatory referral to Speech Therapy   Complete by: As directed       Allergies as of 09/22/2024   No Known Allergies      Medication List     STOP taking these medications    polyethylene glycol 17 g packet Commonly known as: MiraLax  Replaced by: polyethylene glycol powder 17 GM/SCOOP powder   valsartan  80 MG tablet Commonly known as: DIOVAN        TAKE these medications    acetaminophen  325 MG tablet Commonly known as: TYLENOL  Take 2 tablets (650 mg total) by mouth every 4 (four) hours as needed for mild pain (pain score 1-3), fever or headache. What changed: reasons to take this   amLODipine  5 MG tablet Commonly known as: NORVASC  Take 1 tablet (5 mg total) by mouth daily. What changed:  medication strength how much to take   aspirin  EC 81 MG tablet Take 1 tablet (81 mg total) by mouth daily. Swallow whole.   atorvastatin  40 MG tablet Commonly known as: LIPITOR Take 1 tablet (40 mg total) by mouth daily.   lacosamide  50 MG Tabs tablet Commonly known as: VIMPAT  Take 1 tablet (50 mg total) by mouth every 12 (twelve) hours.   melatonin 5 MG Tabs Take 1 tablet (5 mg total) by mouth at bedtime as needed.   metoprolol  succinate 25 MG 24 hr tablet Commonly known as: TOPROL -XL Take 0.5 tablets (12.5 mg total) by mouth at bedtime.   pantoprazole  40 MG tablet Commonly known as: PROTONIX  Take 1 tablet (40 mg total) by mouth at bedtime.   polyethylene glycol powder 17 GM/SCOOP powder Commonly known as: GaviLAX Dissolve 1 capful (17g) in 4-8 ounces of liquid and take by mouth daily. Replaces: polyethylene glycol 17 g packet   Stool Softener/Laxative 50-8.6 MG tablet Generic drug: senna-docusate Take 1 tablet by mouth 2 (two) times daily.   topiramate  25  MG tablet Commonly known as: TOPAMAX  Take 1 tablet (25 mg total) by mouth 2 (two) times daily.   triamcinolone  ointment 0.1 % Commonly known as: KENALOG  Apply 1 Application topically 2 (two) times daily as needed (skin irritation).   VITAMIN D-3 PO Take 1 capsule by mouth daily.        Follow-up Information     Lucius Krabbe, NP Follow up.   Specialty: Family Medicine Why: Call in 1-2 days for post hospital follow up Contact information: [address] [place] [address] [phone]         [facility] Follow up.   Why: office will call you with follow up appointment Contact information: [address]     [address] [place]  [address] [phone]        Carilyn Prentice BRAVO, MD Follow up.   Specialty: Physical Medicine and Rehabilitation Why: office will call you with follow up appointment Contact information: [address] [place] [address] [phone]                 Signed: Daphne LOISE Satterfield 09/24/2024, 12:42 PM

## 2024-09-22 NOTE — Progress Notes (Signed)
 Inpatient Rehabilitation Discharge Medication Review by a Pharmacist  A complete drug regimen review was completed for this patient to identify any potential clinically significant medication issues.  High Risk Drug Classes Is patient taking? Indication by Medication  Antipsychotic No   Anticoagulant No   Antibiotic No   Opioid No   Antiplatelet No   Hypoglycemics/insulin No   Vasoactive Medication Yes Metoprolol , amlodipine  - HTN  Chemotherapy No   Other Yes Topiramate , lacosamide -seizure Atorvastatin -HLD Pantoprazole -Reflux     Type of Medication Issue Identified Description of Issue Recommendation(s)  Drug Interaction(s) (clinically significant)     Duplicate Therapy     Allergy     No Medication Administration End Date     Incorrect Dose     Additional Drug Therapy Needed     Significant med changes from prior encounter (inform family/care partners about these prior to discharge).    Other       Clinically significant medication issues were identified that warrant physician communication and completion of prescribed/recommended actions by midnight of the next day:  No  Name of provider notified for urgent issues identified:   Provider Method of Notification:     Pharmacist comments: None  Time spent performing this drug regimen review (minutes):  20 minutes  Thank you. Olam Monte, PharmD

## 2024-10-01 ENCOUNTER — Ambulatory Visit: Admitting: Family

## 2024-10-01 ENCOUNTER — Encounter: Payer: Self-pay | Admitting: Family

## 2024-10-01 VITALS — BP 130/80 | Temp 97.7°F | Ht 63.0 in | Wt 134.6 lb

## 2024-10-01 DIAGNOSIS — K219 Gastro-esophageal reflux disease without esophagitis: Secondary | ICD-10-CM

## 2024-10-01 DIAGNOSIS — R561 Post traumatic seizures: Secondary | ICD-10-CM | POA: Diagnosis not present

## 2024-10-01 DIAGNOSIS — F09 Unspecified mental disorder due to known physiological condition: Secondary | ICD-10-CM

## 2024-10-01 DIAGNOSIS — E782 Mixed hyperlipidemia: Secondary | ICD-10-CM | POA: Diagnosis not present

## 2024-10-01 DIAGNOSIS — I6930 Unspecified sequelae of cerebral infarction: Secondary | ICD-10-CM

## 2024-10-01 DIAGNOSIS — N1832 Chronic kidney disease, stage 3b: Secondary | ICD-10-CM

## 2024-10-01 DIAGNOSIS — I610 Nontraumatic intracerebral hemorrhage in hemisphere, subcortical: Secondary | ICD-10-CM

## 2024-10-01 DIAGNOSIS — I129 Hypertensive chronic kidney disease with stage 1 through stage 4 chronic kidney disease, or unspecified chronic kidney disease: Secondary | ICD-10-CM

## 2024-10-01 DIAGNOSIS — I10 Essential (primary) hypertension: Secondary | ICD-10-CM

## 2024-10-01 DIAGNOSIS — I69211 Memory deficit following other nontraumatic intracranial hemorrhage: Secondary | ICD-10-CM | POA: Diagnosis not present

## 2024-10-01 DIAGNOSIS — I69398 Other sequelae of cerebral infarction: Secondary | ICD-10-CM

## 2024-10-01 DIAGNOSIS — Z8673 Personal history of transient ischemic attack (TIA), and cerebral infarction without residual deficits: Secondary | ICD-10-CM

## 2024-10-01 MED ORDER — PANTOPRAZOLE SODIUM 40 MG PO TBEC: 40.0000 mg | DELAYED_RELEASE_TABLET | Freq: Every day | ORAL | 5 refills | Status: AC

## 2024-10-01 NOTE — Progress Notes (Unsigned)
 "  Patient ID: Rhonda Cortez, female    DOB: July 13, 1961, 63 y.o.   MRN: [id number]  Chief Complaint  Patient presents with   Hospitalization Follow-up    11/14-12/10     Subjective:    Outpatient Medications Prior to Visit  Medication Sig Dispense Refill   acetaminophen  (TYLENOL ) 325 MG tablet Take 2 tablets (650 mg total) by mouth every 4 (four) hours as needed for mild pain (pain score 1-3), fever or headache.     amLODipine  (NORVASC ) 5 MG tablet Take 1 tablet (5 mg total) by mouth daily. 30 tablet 0   aspirin  EC 81 MG tablet Take 1 tablet (81 mg total) by mouth daily. Swallow whole. 100 tablet 0   atorvastatin  (LIPITOR) 40 MG tablet Take 1 tablet (40 mg total) by mouth daily. 30 tablet 0   Cholecalciferol (VITAMIN D-3 PO) Take 1 capsule by mouth daily.     lacosamide  (VIMPAT ) 50 MG TABS tablet Take 1 tablet (50 mg total) by mouth every 12 (twelve) hours. 60 tablet 0   melatonin 5 MG TABS Take 1 tablet (5 mg total) by mouth at bedtime as needed.     metoprolol  succinate (TOPROL -XL) 25 MG 24 hr tablet Take 0.5 tablets (12.5 mg total) by mouth at bedtime. 30 tablet 0   pantoprazole  (PROTONIX ) 40 MG tablet Take 1 tablet (40 mg total) by mouth at bedtime.     polyethylene glycol powder (GAVILAX) 17 GM/SCOOP powder Dissolve 1 capful (17g) in 4-8 ounces of liquid and take by mouth daily. 238 g 0   senna-docusate (SENOKOT-S) 8.6-50 MG tablet Take 1 tablet by mouth 2 (two) times daily. 60 tablet 0   topiramate  (TOPAMAX ) 25 MG tablet Take 1 tablet (25 mg total) by mouth 2 (two) times daily. 60 tablet 0   triamcinolone  ointment (KENALOG ) 0.1 % Apply 1 Application topically 2 (two) times daily as needed (skin irritation).     No facility-administered medications prior to visit.   Past Medical History:  Diagnosis Date   Hypertension    Stroke [facility])    Past Surgical History:  Procedure Laterality Date   OOPHORECTOMY     TUBAL LIGATION     Allergies[1]    Objective:     Physical Exam Vitals and nursing note reviewed.  Constitutional:      Appearance: Normal appearance.  Cardiovascular:     Rate and Rhythm: Normal rate and regular rhythm.  Pulmonary:     Effort: Pulmonary effort is normal.     Breath sounds: Normal breath sounds.  Musculoskeletal:        General: Normal range of motion.  Skin:    General: Skin is warm and dry.  Neurological:     Mental Status: She is alert.     Sensory: Sensation is intact.     Motor: Weakness (right hand, leg, and foot) present.     Coordination: Coordination is intact.     Gait: Gait is intact.  Psychiatric:        Mood and Affect: Mood normal.        Behavior: Behavior normal.    BP 130/80 (BP Location: Left Arm, Patient Position: Sitting, Cuff Size: Normal)   Temp 97.7 F (36.5 C) (Temporal)   Ht 5' 3 (1.6 m)   Wt 134 lb 9.6 oz (61.1 kg)   BMI 23.84 kg/m  Wt Readings from Last 3 Encounters:  10/01/24 134 lb 9.6 oz (61.1 kg)  09/22/24 113 lb 5.1 oz (51.4 kg)  09/04/24 129 lb 10.1 oz (58.8 kg)      Tatyana Biber, NP       [1] No Known Allergies "

## 2024-10-02 LAB — COMPREHENSIVE METABOLIC PANEL WITH GFR
AG Ratio: 1.5 (calc) (ref 1.0–2.5)
ALT: 16 U/L (ref 6–29)
AST: 17 U/L (ref 10–35)
Albumin: 4.4 g/dL (ref 3.6–5.1)
Alkaline phosphatase (APISO): 67 U/L (ref 37–153)
BUN/Creatinine Ratio: 13 (calc) (ref 6–22)
BUN: 26 mg/dL — ABNORMAL HIGH (ref 7–25)
CO2: 22 mmol/L (ref 20–32)
Calcium: 9.7 mg/dL (ref 8.6–10.4)
Chloride: 107 mmol/L (ref 98–110)
Creat: 1.96 mg/dL — ABNORMAL HIGH (ref 0.50–1.05)
Globulin: 2.9 g/dL (ref 1.9–3.7)
Glucose, Bld: 98 mg/dL (ref 65–99)
Potassium: 4.1 mmol/L (ref 3.5–5.3)
Sodium: 139 mmol/L (ref 135–146)
Total Bilirubin: 0.4 mg/dL (ref 0.2–1.2)
Total Protein: 7.3 g/dL (ref 6.1–8.1)
eGFR: 28 mL/min/1.73m2 — ABNORMAL LOW

## 2024-10-04 DIAGNOSIS — Z8673 Personal history of transient ischemic attack (TIA), and cerebral infarction without residual deficits: Secondary | ICD-10-CM | POA: Insufficient documentation

## 2024-10-04 MED ORDER — AMLODIPINE BESYLATE 5 MG PO TABS: 5.0000 mg | ORAL_TABLET | Freq: Every day | ORAL | 1 refills | Status: AC

## 2024-10-04 MED ORDER — ATORVASTATIN CALCIUM 40 MG PO TABS: 40.0000 mg | ORAL_TABLET | Freq: Every day | ORAL | 1 refills | Status: AC

## 2024-10-05 ENCOUNTER — Ambulatory Visit: Payer: Self-pay | Admitting: Family

## 2024-10-13 ENCOUNTER — Encounter: Payer: Self-pay | Admitting: Registered Nurse

## 2024-10-13 ENCOUNTER — Encounter: Attending: Registered Nurse | Admitting: Registered Nurse

## 2024-10-13 VITALS — BP 148/92 | HR 66 | Ht 63.0 in | Wt 134.0 lb

## 2024-10-13 DIAGNOSIS — I63532 Cerebral infarction due to unspecified occlusion or stenosis of left posterior cerebral artery: Secondary | ICD-10-CM | POA: Diagnosis present

## 2024-10-13 DIAGNOSIS — I10 Essential (primary) hypertension: Secondary | ICD-10-CM | POA: Insufficient documentation

## 2024-10-13 MED ORDER — LACOSAMIDE 50 MG PO TABS: 50.0000 mg | ORAL_TABLET | Freq: Two times a day (BID) | ORAL | 0 refills | Status: DC

## 2024-10-13 MED ORDER — TOPIRAMATE 25 MG PO TABS: 25.0000 mg | ORAL_TABLET | Freq: Two times a day (BID) | ORAL | 0 refills | Status: DC

## 2024-10-13 MED ORDER — METOPROLOL SUCCINATE ER 25 MG PO TB24: 12.5000 mg | ORAL_TABLET | Freq: Every day | ORAL | 0 refills | Status: DC

## 2024-10-13 NOTE — Progress Notes (Signed)
 "  Subjective:    Patient ID: Rhonda Cortez, female    DOB: 07/21/1961, 63 y.o.   MRN: [id number]  Rhonda Cortez is a 63 y.o. female who is here for HFU appointment for F/U of her Acute Ischemic left PCA Stroke and Essential Hypertension. She presented to [facility] on 08/27/2024, having difficulty with word finding.   Dr. Michaela H&P Rhonda Cortez is a 63 y.o. female with hx of hypertension and previous stroke who first noticed that she had some confusion yesterday evening.  Over the course of the day, she noticed difficulty with word finding, and felt like she was missing some time.  Due to this she sought care in the emergency department, and was noticed that she displayed some signs of confusion.  A head CT was therefore performed which demonstrates a caudate head hemorrhage with intraventricular extension.  Neurology was therefore consulted for further management.  CT Head: WO Contrast IMPRESSION: 1. Acute intracranial hemorrhage appears to be significant intraventricular extension from an acute bleed in the left caudate nucleus (estimated 2 mL). 2. Mild asymmetric enlargement of the left lateral due to blood ;temporal horns remain diminutive. Trace rightward midline shift (23 mm). 3. Evidence of underlying age advanced chronic small vessel disease. 4. Study discussed by telephone with Dr. Pamella at the time of this report.  MR Brain: WO Contrast IMPRESSION: 1. Focal area of intraparenchymal hemorrhage within the left caudate measuring approximately 1.7 x 1.1 x 1.9 cm, as seen on prior CT. 2. Intraventricular hemorrhage, primarily within the left lateral ventricle, but also layering independently within the posterior horn of the right lateral ventricle and within the fourth ventricle, as seen on prior CT. 3. No evidence of underlying mass. 4. Extensive cerebral white matter disease. 5. Small focus of hemosiderin staining present within each frontal lobe and within the left anteromedial  cerebellar hemisphere.  Neurology Following and recommended no antithrombotics due to ICH.   [name] was admitted to inpatient rehabilitation on 09/08/2024 and discharged home, with supervised care 24/7. Her sister is dispensing her medication.   [name] reports some vison changeds and sister agrees, Referral placed to Dr Jeannene, she and her sister verbalizes understanding.   She denies any pain. She rates her pain 0. Also reports she has a good appetite.  She will be receiving outpatient therapy with [facility].       Pain Inventory Average Pain 0 Pain Right Now 0 My pain is .  In the last 24 hours, has pain interfered with the following? General activity 0 Relation with others 0 Enjoyment of life 0 What TIME of day is your pain at its worst? varies Sleep (in general) Good  Pain is worse with: . Pain improves with: . Relief from Meds: .  walk without assistance how many minutes can you walk? 10-15 ability to climb steps?  yes do you drive?  yes  employed # of hrs/week 40  trouble walking confusion  Hospital f/u  Hospital f/u    Family History  Problem Relation Age of Onset   Diabetes Mother    Kidney disease Father    Hypertension Father    Hypertension Sister    Hypertension Brother    Asthma Son    Social History   Socioeconomic History   Marital status: Married    Spouse name: Not on file   Number of children: 2   Years of education: Not on file   Highest education level: GED  or equivalent  Occupational History   Not on file  Tobacco Use   Smoking status: Never   Smokeless tobacco: Never  Vaping Use   Vaping status: Never Used  Substance and Sexual Activity   Alcohol use: No   Drug use: Never   Sexual activity: Not Currently    Birth control/protection: Surgical  Other Topics Concern   Not on file  Social History Narrative   Not on file   Social Drivers of Health   Tobacco Use: Low Risk (10/01/2024)   Patient  History    Smoking Tobacco Use: Never    Smokeless Tobacco Use: Never    Passive Exposure: Not on file  Financial Resource Strain: Low Risk (02/17/2024)   Overall Financial Resource Strain (CARDIA)    Difficulty of Paying Living Expenses: Not hard at all  Food Insecurity: No Food Insecurity (09/08/2024)   Epic    Worried About Radiation Protection Practitioner of Food in the Last Year: Never true    Ran Out of Food in the Last Year: Never true  Transportation Needs: No Transportation Needs (09/08/2024)   Epic    Lack of Transportation (Medical): No    Lack of Transportation (Non-Medical): No  Physical Activity: Insufficiently Active (02/17/2024)   Exercise Vital Sign    Days of Exercise per Week: 5 days    Minutes of Exercise per Session: 20 min  Stress: No Stress Concern Present (02/17/2024)   [employer] of Occupational Health - Occupational Stress Questionnaire    Feeling of Stress : Not at all  Social Connections: Socially Isolated (02/17/2024)   Social Connection and Isolation Panel    Frequency of Communication with Friends and Family: Twice a week    Frequency of Social Gatherings with Friends and Family: Never    Attends Religious Services: More than 4 times per year    Active Member of [employer] or Organizations: No    Attends Engineer, Structural: Not on file    Marital Status: Never married  Depression (PHQ2-9): High Risk (10/13/2024)   Depression (PHQ2-9)    PHQ-2 Score: 18  Alcohol Screen: Not on file  Housing: Unknown (09/08/2024)   Epic    Unable to Pay for Housing in the Last Year: No    Number of Times Moved in the Last Year: Not on file    Homeless in the Last Year: No  Utilities: Not At Risk (09/08/2024)   Epic    Threatened with loss of utilities: No  Health Literacy: Not on file   Past Surgical History:  Procedure Laterality Date   OOPHORECTOMY     TUBAL LIGATION     Past Medical History:  Diagnosis Date   Hypertension    Stroke (HCC)    BP (!) 150/91   Pulse  66   Ht 5' 3 (1.6 m)   Wt 134 lb (60.8 kg)   SpO2 98%   BMI 23.74 kg/m   Opioid Risk Score:   Fall Risk Score:  `1  Depression screen [facility] 2/9     10/13/2024   10:55 AM 02/17/2024   11:47 AM 01/03/2023   12:00 PM  Depression screen PHQ 2/9  Decreased Interest 3 0 1  Down, Depressed, Hopeless 3 0 0  PHQ - 2 Score 6 0 1  Altered sleeping 2  1  Tired, decreased energy 2  1  Change in appetite 0  0  Feeling bad or failure about yourself  3  0  Trouble concentrating 3  0  Moving slowly or fidgety/restless 2  0  Suicidal thoughts 0  0  PHQ-9 Score 18  3   Difficult doing work/chores   Somewhat difficult     Data saved with a previous flowsheet row definition          Family History  Problem Relation Age of Onset   Diabetes Mother    Kidney disease Father    Hypertension Father    Hypertension Sister    Hypertension Brother    Asthma Son    Social History   Socioeconomic History   Marital status: Married    Spouse name: Not on file   Number of children: 2   Years of education: Not on file   Highest education level: GED or equivalent  Occupational History   Not on file  Tobacco Use   Smoking status: Never   Smokeless tobacco: Never  Vaping Use   Vaping status: Never Used  Substance and Sexual Activity   Alcohol use: No   Drug use: Never   Sexual activity: Not Currently    Birth control/protection: Surgical  Other Topics Concern   Not on file  Social History Narrative   Not on file   Social Drivers of Health   Tobacco Use: Low Risk (10/01/2024)   Patient History    Smoking Tobacco Use: Never    Smokeless Tobacco Use: Never    Passive Exposure: Not on file  Financial Resource Strain: Low Risk (02/17/2024)   Overall Financial Resource Strain (CARDIA)    Difficulty of Paying Living Expenses: Not hard at all  Food Insecurity: No Food Insecurity (09/08/2024)   Epic    Worried About Radiation Protection Practitioner of Food in the Last Year: Never true    Ran Out of Food  in the Last Year: Never true  Transportation Needs: No Transportation Needs (09/08/2024)   Epic    Lack of Transportation (Medical): No    Lack of Transportation (Non-Medical): No  Physical Activity: Insufficiently Active (02/17/2024)   Exercise Vital Sign    Days of Exercise per Week: 5 days    Minutes of Exercise per Session: 20 min  Stress: No Stress Concern Present (02/17/2024)   [employer] of Occupational Health - Occupational Stress Questionnaire    Feeling of Stress : Not at all  Social Connections: Socially Isolated (02/17/2024)   Social Connection and Isolation Panel    Frequency of Communication with Friends and Family: Twice a week    Frequency of Social Gatherings with Friends and Family: Never    Attends Religious Services: More than 4 times per year    Active Member of [employer] or Organizations: No    Attends Engineer, Structural: Not on file    Marital Status: Never married  Depression (PHQ2-9): Low Risk (02/17/2024)   Depression (PHQ2-9)    PHQ-2 Score: 0  Alcohol Screen: Not on file  Housing: Unknown (09/08/2024)   Epic    Unable to Pay for Housing in the Last Year: No    Number of Times Moved in the Last Year: Not on file    Homeless in the Last Year: No  Utilities: Not At Risk (09/08/2024)   Epic    Threatened with loss of utilities: No  Health Literacy: Not on file   Past Surgical History:  Procedure Laterality Date   OOPHORECTOMY     TUBAL LIGATION     Past Medical History:  Diagnosis Date   Hypertension    Stroke (HCC)  There were no vitals taken for this visit.  Opioid Risk Score:   Fall Risk Score:  `1  Depression screen [facility] 2/9     02/17/2024   11:47 AM 01/03/2023   12:00 PM  Depression screen PHQ 2/9  Decreased Interest 0 1  Down, Depressed, Hopeless 0 0  PHQ - 2 Score 0 1  Altered sleeping  1  Tired, decreased energy  1  Change in appetite  0  Feeling bad or failure about yourself   0  Trouble concentrating  0  Moving  slowly or fidgety/restless  0  Suicidal thoughts  0  PHQ-9 Score  3   Difficult doing work/chores  Somewhat difficult     Data saved with a previous flowsheet row definition    Review of Systems  Musculoskeletal:  Positive for back pain.  Psychiatric/Behavioral:  Positive for confusion.   All other systems reviewed and are negative.      Objective:   Physical Exam Vitals and nursing note reviewed.  Constitutional:      Appearance: Normal appearance.  Cardiovascular:     Rate and Rhythm: Normal rate and regular rhythm.     Pulses: Normal pulses.     Heart sounds: Normal heart sounds.  Pulmonary:     Effort: Pulmonary effort is normal.     Breath sounds: Normal breath sounds.  Musculoskeletal:     Comments: Normal Muscle Bulk and Muscle Testing Reveals:  Upper Extremities: Full ROM and Muscle Strength 5/5  Lower Extremities: Full ROM and Muscle Strength 5/5 Arises from Table with ease Narrow Based  Gait     Skin:    General: Skin is warm and dry.  Neurological:     Mental Status: She is alert and oriented to person, place, and time.  Psychiatric:        Mood and Affect: Mood normal.        Behavior: Behavior normal.          Assessment & Plan:  Acute Ischemic left PCA Stroke : She has a scheduled appointment with Neurology and Outpatient Therapy with [facility].   Essential Hypertension: Continue current medication regimen. PCP following. Continue to Monitor.  3. Vision Changes: Referral to Dr Jeannene.   F/U with Dr Carilyn in 4- 6 weeks   "

## 2024-10-13 NOTE — Patient Instructions (Addendum)
 You seen your Primary Care : on 10/01/2024  She states for you to Follow up in 3 months, please call to make sure you obtain and appointment.

## 2024-10-15 NOTE — Progress Notes (Signed)
 [facility] Worker Note Stroke Post Discharge Follow-Up  Rhonda Cortez [id number]   Contact Type:  Telephone Encounter Date: 09/22/2024  Outreach Project:  Stroke post discharge follow-up Managed Medicaid Plan Participant:   PCP: Yes - See Care Teams in patient chart Payor Status: Does the patient have health insurance (Y/N): Yes Payor Name: : [facility] Health Worker Documentation     Row Name 10/13/24 1055     Post-Stroke CHW Follow-Up Telephone Call   Discharge Date 09/02/24   Discharge Location [facility]   How have you been feeling since being released from the hospital? Patient states she has been feeling good overall   Program RN notified of new or worsening symptoms No   Little interest or pleasure in doing things Nearly every day   Feeling down, depressed, or hopeless (PHQ Adolescent also includes...irritable) Nearly every day   PHQ-2 Total Score 6   PHQ2-9 >4 Program RN notified of new or worsening symptoms. Yes     Functional Questionnaire - ADL's   Bathing Independent   Dressing Independent   Meal Prep Dependent   Eating Independent   Maintaining Continence Independent   Ambulation/Transferring Independent   Medication Management Dependent   Does the patient have support for these ADL's Yes  Per patient she has a friend that comes over to assist her     Post-Discharge Instructions Reviewed   Did the patient receive and understand the discharge instructions provided? Yes   Did the patient obtain their post-discharge medications? Yes   Does the patient have any questions related to side effects, education/health literacy? No     Follow-Up Appointments Review   PCP Hospital F/U appointment scheduled? Yes   Did the patient keep PCP discharge appointment? Yes   Specialist Hospital F/U appointment scheduled? Yes   Did the patient keep the specialist appointment Yes   Did the patient have referral(s) to PT/OT/SLP post discharge? PT   Has the  patient been given appointments for the referrals? Yes   Did the patient keep their PT/OT/SLP appointment(s)? Yes     Risk Factor Follow-Up   Is the pateint diabetic? No   Does the patient have hypertension? Yes   Does the patient have the medications/tools needed to manage their hypertension? Yes   Does patient smoke, use tobacco product, and/or vape?  No   Does the patient have nutritional needs/restrictions? Yes   Does the patient have nutritional needs/restrictions? Hypertension diet   Does the patient exercise IF recommended by the discharge care team? Yes  Patient walks and streches in the home   Can the patient verbalize understanding of the actions needed to manage their current condition? Yes   Can the patient verbalize actions needed to address his/her current risk factors to prevent another stroke?  Yes   Would the patient like helping finding additional community resources and/or stroke support groups? No     Depression Scale- Over the past 2 weeks, how often have you been bothered by any of the following problems?   Trouble falling or staying asleep, or sleeping too much More than half the days   Feeling tired or having little energy More than half the days   Poor appetite or overeating (PHQ Adolescent also includes...weight loss) Not at all   Feeling bad about yourself - or that you are a failure or have let yourself or your family down Nearly every day   Trouble concentrating on things, such as reading the  newspaper or watching television [facility] Adolescent also includes...like school work) Nearly every day   Moving or speaking so slowly that other people could have noticed. Or the opposite - being so fidgety or restless that you have been moving around a lot more than usual More than half the days   Thoughts that you would be better off dead, or of hurting yourself in some way Not at all     PHQ 2 & 9 Depression Scale- Over the past 2 weeks, how often have you been bothered by  any of the following problems?   PHQ-9 Total Score 18       Past Medical History:  Diagnosis Date   Hypertension    Stroke [facility])    Social History   Substance and Sexual Activity  Alcohol Use No   Social History   Substance and Sexual Activity  Drug Use Never   Tobacco Use History[1]  SDOH Screenings   Food Insecurity: No Food Insecurity (09/08/2024)  Housing: Unknown (09/08/2024)  Transportation Needs: No Transportation Needs (09/08/2024)  Utilities: Not At Risk (09/08/2024)  Depression (PHQ2-9): Low Risk (10/13/2024)  Recent Concern: Depression (PHQ2-9) - High Risk (10/13/2024)  Financial Resource Strain: Low Risk (02/17/2024)  Physical Activity: Insufficiently Active (02/17/2024)  Social Connections: Socially Isolated (02/17/2024)  Stress: No Stress Concern Present (02/17/2024)  Tobacco Use: Low Risk (10/13/2024)   Referrals (if applicable):        Education:  N/A  Limiting Factors:   N/A  Follow-up:   Initial F/U  Follow-up Type:   Telephone  Per health equity protocol additional f/u is to be scheduled  Kemoni Ortega A Shishir Krantz         [1]  Social History Tobacco Use  Smoking Status Never  Smokeless Tobacco Never

## 2024-10-19 DIAGNOSIS — I6930 Unspecified sequelae of cerebral infarction: Secondary | ICD-10-CM | POA: Insufficient documentation

## 2024-10-20 ENCOUNTER — Telehealth: Payer: Self-pay | Admitting: *Deleted

## 2024-10-20 NOTE — Telephone Encounter (Signed)
 Received a message from the CHW Neveah in regards to patients depression screening being above 4 during follow up phone call. Called patient to follow up. Patient stated she is feeling much better since the conversation 09/22/24. Patient stated she is no longer having difficulty falling or staying asleep. Patient stated her energy has improved and no longer having difficulty with little energy. Patient stated she is not having any negative feelings about self. Patient did state she occasionally has difficulty concentrating. Patient went to follow up visit with PCP 10/01/2024 and stated all her needs were met at visit. Let patient know if she has any additional questions or needs she can reach out to me. I gave her my contact information. Patient verbalized understanding.

## 2024-10-25 ENCOUNTER — Ambulatory Visit: Admitting: Adult Health

## 2024-10-25 ENCOUNTER — Encounter: Payer: Self-pay | Admitting: Adult Health

## 2024-10-25 VITALS — BP 136/84 | HR 91 | Ht 63.5 in | Wt 135.0 lb

## 2024-10-25 DIAGNOSIS — I6389 Other cerebral infarction: Secondary | ICD-10-CM | POA: Diagnosis not present

## 2024-10-25 DIAGNOSIS — R569 Unspecified convulsions: Secondary | ICD-10-CM

## 2024-10-25 DIAGNOSIS — I619 Nontraumatic intracerebral hemorrhage, unspecified: Secondary | ICD-10-CM | POA: Diagnosis not present

## 2024-10-25 DIAGNOSIS — E785 Hyperlipidemia, unspecified: Secondary | ICD-10-CM | POA: Diagnosis not present

## 2024-10-25 NOTE — Progress Notes (Signed)
 "   PATIENT: Rhonda Cortez DOB: [birth year]  REASON FOR VISIT: follow up HISTORY FROM: patient PRIMARY NEUROLOGIST: Dr. Rosemarie  Chief Complaint  Patient presents with   Follow-up    Patient in room 18 alone. Patient is here for stroke follow up. Patient states no new or concerning issues at the moment.      HISTORY OF PRESENT ILLNESS: Today   Rhonda Cortez is a 64 y.o. 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 Returns today for follow-up.  She reports that she is now back to her baseline.  Denies any weakness confusion or other strokelike symptoms.  She has noticed some difficulty with her memory since the stroke.  She states that she lives at home alone.  Able to complete all ADLs independently.  Her sister helps her with her appointments medications and finances.  Her sister drove her to the appointment today.  She is currently not driving.  LDL is 164 currently on Lipitor and hemoglobin A1c 5.8.  She remains on aspirin .  She states that she does snore.  But she does not feel sleepy during the day.  No witnessed apneic events to her knowledge.  Never had a sleep study.  While in the hospital on 1122 the patient had symptoms that initiated an inpatient code stroke but after workup i more likely that this could be seizure event.  She was started on Keppra  but then subsequently switched to Vimpat  due to being unable to swallow pills.  Patient reports to her knowledge she is not had any seizure events.  She returns today for an evaluation.     Imaging:   MRI Brain: IMPRESSION: 1. Acute infarcts in the Left PCA and adjacent MCA/PCA watershed territories with mild cytotoxic edema. No acute hemorrhagic transformation. 2. Slowing regressing prior left  caudate and intraventricular hemorrhage, without progression from earlier this month. Borderline to mild ventriculomegaly and transependymal edema are stable. 3. No significant intracranial mass effect, midline shift. 4. Numerous chronic microhemorrhages and mild superficial siderosis, but pattern currently favoring ordinary small vessel disease over amyloid angiopathy.  CTA head/neck:   IMPRESSION: 1. High-grade, near occlusive stenosis in the distal left M1 segment and proximal left P2 segment. 2. Moderate tandem stenoses in the proximal right P2 segment and proximal right P3 segment. 3. 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 4. Mild narrowing in the distal left A2 segment. 5. Minimal atherosclerotic calcification at the right carotid bifurcation and minimal atherosclerotic changes in the proximal left ICA, both without significant stenosis.   HISTORY (copied from Hospital)[name] is a 64 y.o. female with history of hx CKD and HTN initially admitted for small L caudate IPH and subsequently discharged to CIR. Stroke code was called on 11/22 for acute onset poor responsiveness in bathroom, concerning for seizure. No seizures on LTM EEG overnight. She has difficulty swallowing pills therefore keppra  changed to vimpat . MRI however, showed left PCA and MCA/PCA infarct.    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  Resolving left caudate and left lateral ventricular hemorrhage without new hemorrhage. Stable periventricular and subcortical white matter changes bilaterally, moderately advanced  for age.  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 Moderate tandem stenoses in the proximal right P2 segment and proximal right P3 segment.  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 MRI: Acute infarcts in the Left PCA and adjacent MCA/PCA watershed territories with mild cytotoxic edema. No acute hemorrhagic transformation. Slowing regressing prior left caudate and intraventricular hemorrhage, without progression from earlier this month. Borderline to mild ventriculomegaly and transependymal edema are stable. Numerous chronic microhemorrhages and mild superficial siderosis, but pattern currently favoring ordinary small vessel disease over amyloid angiopathy. VTE prophylaxis - heparin  subcu No antithrombotic prior to admission, now on aspirin  81.  No DAPT due to recent ICH/IVH Therapy recommendations:  CIR Disposition: Pending   Concerning for orthostatic hypotension Suspect syncope on 11/22 due to orthostatic hypotension Stroke likely caused by chronic L P2 occlusion in the setting of hypotension Orthostatic vitals 11/25 no significant hypotension, 141/83-88, 121/91-109, 126/93-117 Avoid low BP Long term BP goal 130-150 given severe stenosis/occlusion of left M1 and P1   Concern for seizure  11/22 episode concerning for seizrue LTM with no seizure activity  Keppra  changed to Vimpat  Continue Vimpat  50 mg twice daily    Hx of recent ICH 08/27/2024 Left caudate head ICH with IVH, likely due to uncontrolled hypertension.  CT and MRI repeat showed stable hematoma.  MRA showed severe stenosis left M1 and M2.  Left fetal PCA P2 occlusion.  Mild to moderate stenosis mid basilar artery.  EF 50 to 65%.  Carotid Doppler unremarkable.  LDL 164, A1c 5.8.  Discharged to CIR Lipitor 40.   Hx of Stroke/TIA Stroke in 2014 - arm and leg weakness, difficulty walking. Was on ASA after stroke for a number of years and then off. No significant residual deficit   Hypertension Home meds:  Norvasc , valsartan  On home norvasc   Stable now 140s Avoid low BP Orthostatic vital today no significant hypotension BP goal less than 160 Long-term BP goal  130-150 given severe stenosis/occlusion of left M1 and P1   Hyperlipidemia Home meds:  pitavastatin  LDL 164, goal < 70 On atorvastatin  40mg   Continue statin on discharge   CKD 3b Cre 2.19--1.95--2.2--1.76--1.81 BMP monitoring Avoid nephro toxic agent   Other stroke risk factors     Other clinical issues Leukocytosis WBC 11.9--12.2--13.8--13.0    REVIEW OF SYSTEMS: Out of a complete 14 system review of symptoms, the patient complains only of the following symptoms, and all other reviewed systems are negative.  ALLERGIES: Allergies[1]  HOME MEDICATIONS: Outpatient Medications Prior to Visit  Medication Sig Dispense Refill   acetaminophen  (TYLENOL ) 325 MG tablet Take 2 tablets (650 mg total) by mouth every 4 (four) hours as needed for mild pain (pain score 1-3), fever or headache.     amLODipine  (NORVASC ) 5 MG tablet Take 1 tablet (5 mg total) by mouth daily. 90 tablet 1   aspirin  EC 81 MG tablet Take 1 tablet (81 mg total) by mouth daily. Swallow whole. 100 tablet 0   atorvastatin  (LIPITOR) 40 MG tablet Take 1 tablet (40 mg total) by mouth daily. 90 tablet 1   Cholecalciferol (VITAMIN D-3 PO) Take 1 capsule by mouth daily.     lacosamide  (VIMPAT ) 50 MG TABS tablet Take 1 tablet (50 mg total) by mouth every 12 (twelve) hours. 60 tablet 0   melatonin 5 MG TABS Take 1 tablet (5 mg total) by mouth at bedtime as needed.     metoprolol   succinate (TOPROL -XL) 25 MG 24 hr tablet Take 0.5 tablets (12.5 mg total) by mouth at bedtime. 30 tablet 0   pantoprazole  (PROTONIX ) 40 MG tablet Take 1 tablet (40 mg total) by mouth at bedtime. 30 tablet 5   polyethylene glycol powder (GAVILAX) 17 GM/SCOOP powder Dissolve 1 capful (17g) in 4-8 ounces of liquid and take by mouth daily. 238 g 0   senna-docusate (SENOKOT-S) 8.6-50 MG tablet Take 1 tablet by mouth 2 (two) times daily. 60 tablet 0   topiramate  (TOPAMAX ) 25 MG tablet Take 1 tablet (25 mg total) by mouth 2 (two) times daily. 60 tablet 0    triamcinolone  ointment (KENALOG ) 0.1 % Apply 1 Application topically 2 (two) times daily as needed (skin irritation).     No facility-administered medications prior to visit.    PAST MEDICAL HISTORY: Past Medical History:  Diagnosis Date   Hypertension    Stroke [facility])     PAST SURGICAL HISTORY: Past Surgical History:  Procedure Laterality Date   OOPHORECTOMY     TUBAL LIGATION      FAMILY HISTORY: Family History  Problem Relation Age of Onset   Diabetes Mother    Kidney disease Father    Hypertension Father    Hypertension Sister    Hypertension Brother    Asthma Son     SOCIAL HISTORY: Social History   Socioeconomic History   Marital status: Married    Spouse name: Not on file   Number of children: 2   Years of education: Not on file   Highest education level: GED or equivalent  Occupational History   Not on file  Tobacco Use   Smoking status: Never   Smokeless tobacco: Never  Vaping Use   Vaping status: Never Used  Substance and Sexual Activity   Alcohol use: No   Drug use: Never   Sexual activity: Not Currently    Birth control/protection: Surgical  Other Topics Concern   Not on file  Social History Narrative   Patient does live alone   Patient is currently out of work due to the stroke.    Social Drivers of Health   Tobacco Use: Low Risk (10/25/2024)   Patient History    Smoking Tobacco Use: Never    Smokeless Tobacco Use: Never    Passive Exposure: Not on file  Financial Resource Strain: Low Risk (02/17/2024)   Overall Financial Resource Strain (CARDIA)    Difficulty of Paying Living Expenses: Not hard at all  Food Insecurity: No Food Insecurity (09/08/2024)   Epic    Worried About Programme Researcher, Broadcasting/film/video in the Last Year: Never true    Ran Out of Food in the Last Year: Never true  Transportation Needs: No Transportation Needs (09/08/2024)   Epic    Lack of Transportation (Medical): No    Lack of Transportation (Non-Medical): No  Physical  Activity: Insufficiently Active (02/17/2024)   Exercise Vital Sign    Days of Exercise per Week: 5 days    Minutes of Exercise per Session: 20 min  Stress: No Stress Concern Present (02/17/2024)   [employer] of Occupational Health - Occupational Stress Questionnaire    Feeling of Stress : Not at all  Social Connections: Socially Isolated (02/17/2024)   Social Connection and Isolation Panel    Frequency of Communication with Friends and Family: Twice a week    Frequency of Social Gatherings with Friends and Family: Never    Attends Religious Services: More than 4 times per year  Active Member of Clubs or Organizations: No    Attends Banker Meetings: Not on file    Marital Status: Never married  Intimate Partner Violence: Not At Risk (09/08/2024)   Epic    Fear of Current or Ex-Partner: No    Emotionally Abused: No    Physically Abused: No    Sexually Abused: No  Depression (PHQ2-9): Low Risk (10/13/2024)   Depression (PHQ2-9)    PHQ-2 Score: 2  Recent Concern: Depression (PHQ2-9) - High Risk (10/13/2024)   Depression (PHQ2-9)    PHQ-2 Score: 18  Alcohol Screen: Not on file  Housing: Unknown (09/08/2024)   Epic    Unable to Pay for Housing in the Last Year: No    Number of Times Moved in the Last Year: Not on file    Homeless in the Last Year: No  Utilities: Not At Risk (09/08/2024)   Epic    Threatened with loss of utilities: No  Health Literacy: Not on file      PHYSICAL EXAM  Vitals:   10/25/24 0911  BP: 136/84  Pulse: 91  Weight: 135 lb (61.2 kg)  Height: 5' 3.5 (1.613 m)   Body mass index is 23.54 kg/m.  Generalized: Well developed, in no acute distress   Neurological examination  Mentation: Alert oriented to time, place, history taking. Follows all commands speech and language fluent Cranial nerve II-XII: Pupils were equal round reactive to light. Extraocular movements were full, visual field were full on confrontational test. Facial  sensation and strength were normal. Uvula tongue midline. Head turning and shoulder shrug  were normal and symmetric. Motor: The motor testing reveals 5 over 5 strength of all 4 extremities. Good symmetric motor tone is noted throughout.  Sensory: Sensory testing is intact to soft touch on all 4 extremities. No evidence of extinction is noted.  Coordination: Cerebellar testing reveals good finger-nose-finger and heel-to-shin bilaterally.  Gait and station: Gait is normal. Tandem gait is normal. Romberg is negative. No drift is seen.  Reflexes: Deep tendon reflexes are symmetric and normal bilaterally.   DIAGNOSTIC DATA (LABS, IMAGING, TESTING) - I reviewed patient records, labs, notes, testing and imaging myself where available.  Lab Results  Component Value Date   WBC 9.8 09/20/2024   HGB 12.4 09/20/2024   HCT 37.3 09/20/2024   MCV 89.7 09/20/2024   PLT 376 09/20/2024      Component Value Date/Time   NA 139 10/01/2024 1521   K 4.1 10/01/2024 1521   CL 107 10/01/2024 1521   CO2 22 10/01/2024 1521   GLUCOSE 98 10/01/2024 1521   BUN 26 (H) 10/01/2024 1521   CREATININE 1.96 (H) 10/01/2024 1521   CALCIUM  9.7 10/01/2024 1521   PROT 7.3 10/01/2024 1521   ALBUMIN 3.7 09/03/2024 0544   AST 17 10/01/2024 1521   ALT 16 10/01/2024 1521   ALKPHOS 58 09/03/2024 0544   BILITOT 0.4 10/01/2024 1521   GFRNONAA 27 (L) 09/20/2024 0524   Lab Results  Component Value Date   CHOL 241 (H) 08/29/2024   HDL 53 08/29/2024   LDLCALC 164 (H) 08/29/2024   LDLDIRECT 260.0 01/31/2023   TRIG 119 08/29/2024   CHOLHDL 4.5 08/29/2024   Lab Results  Component Value Date   HGBA1C 5.8 (H) 08/28/2024   No results found for: VITAMINB12 Lab Results  Component Value Date   TSH 2.19 01/31/2023      ASSESSMENT AND PLAN 64 y.o. year old female  has a past medical history  of Hypertension and Stroke [facility]). 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   Discussed secondary stroke prevention measures and importance of close PCP follow up for aggressive stroke risk factor management. I have gone over the pathophysiology of stroke, warning signs and symptoms, risk factors and their management in some detail with instructions to go to the closest emergency room for symptoms of concern. HTN: BP goal <130/90.   HLD: LDL goal <70. Recent LDL 164.  DMII: A1c goal<7.0. Recent A1c 5.8.  Encouraged patient to monitor diet and encouraged exercise Continue Vimpat  50 mg twice a day Discussed home sleep test however will call back if she decides to pursue FU with our office 6-9 months     Duwaine Russell, MSN, NP-C 10/25/2024, 9:39 AM [facility] [address] [place], [place] [address] [phone]      [1] No Known Allergies  "

## 2024-10-25 NOTE — Patient Instructions (Signed)
 Your Plan:  Continue aspirin  daily Continue Vimpat  50 mg twice a day for potential seizure event Blood pressure goal <130/90 Cholesterol LDL goal <70 Diabetes goal A1c <7 Monitor diet and try to exercise   Thank you for coming to see us  at [facility]. I hope we have been able to provide you high quality care today.  You may receive a patient satisfaction survey over the next few weeks. We would appreciate your feedback and comments so that we may continue to improve ourselves and the health of our patients.

## 2024-10-26 ENCOUNTER — Ambulatory Visit: Admitting: Occupational Therapy

## 2024-10-26 ENCOUNTER — Ambulatory Visit: Attending: Family | Admitting: Physical Therapy

## 2024-10-26 NOTE — Therapy (Incomplete)
 " OUTPATIENT PHYSICAL THERAPY NEURO EVALUATION   Patient Name: Rhonda Cortez MRN: [id number] DOB:December 01, 1960, 64 y.o., female Today's Date: 10/26/2024   PCP: PIERRETTE REFERRING PROVIDER: Maurice Sharlet RAMAN, PA-C  END OF SESSION:   Past Medical History:  Diagnosis Date   Hypertension    Stroke [facility])    Past Surgical History:  Procedure Laterality Date   OOPHORECTOMY     TUBAL LIGATION     Patient Active Problem List   Diagnosis Date Noted   Sequelae of cerebral infarction 10/19/2024   Status post CVA 10/04/2024   Cognitive and neurobehavioral dysfunction 09/15/2024   Acute ischemic left PCA stroke (HCC) 09/08/2024   Acute CVA (cerebrovascular accident) (HCC) 09/06/2024   History of intracranial hemorrhage 09/05/2024   Seizure (HCC) 09/04/2024   Chronic kidney disease, stage 3b (HCC) 09/04/2024   ICH (intracerebral hemorrhage) (HCC) 09/02/2024   Intraparenchymal hemorrhage of brain (HCC) 08/27/2024   Mixed hyperlipidemia 02/17/2024   Positive colorectal cancer screening using Cologuard test 02/17/2024   Essential (primary) hypertension 01/03/2023   History of stroke 01/03/2023   Gastroesophageal reflux disease without esophagitis 01/03/2023   Constipation 01/03/2023   Cerebrovascular accident (HCC) 12/23/2019    ONSET DATE: 09/20/2024 (referral date)  REFERRING DIAG: P36.467 (ICD-10-CM) - Acute ischemic left PCA stroke (HCC)  THERAPY DIAG:  No diagnosis found.  Rationale for Evaluation and Treatment: Rehabilitation  SUBJECTIVE:                                                                                                                                                                                             SUBJECTIVE STATEMENT: ***  Pt accompanied by: {accompnied:27141}  PERTINENT HISTORY: ***PMH: HTN, CVA, GERD, CKD, seizure  Per acute care PT note 09/02/24: Pt is a 64 y.o. female presenting 11/14 with difficulty word finding and confusion. CTH 11/14 with acute  intracranial hemorrhage in the left caudate nucleus with intraventricular extension; trace rightward midline shift. CTH 11/15 with bleed stable.  PAIN:  Are you having pain? {OPRCPAIN:27236}  PRECAUTIONS: {Therapy precautions:24002}  RED FLAGS: {PT Red Flags:29287}   WEIGHT BEARING RESTRICTIONS: {Yes ***/No:24003}  FALLS: Has patient fallen in last 6 months? {fallsyesno:27318}  LIVING ENVIRONMENT: Lives with: {OPRC lives with:25569::lives with their family} Lives in: {Lives in:25570} Stairs: {opstairs:27293} Has following equipment at home: {Assistive devices:23999}  PLOF: {PLOF:24004}  PATIENT GOALS: ***  OBJECTIVE:  Note: Objective measures were completed at Evaluation unless otherwise noted.  DIAGNOSTIC FINDINGS: ***  COGNITION: Overall cognitive status: {cognition:24006}   SENSATION: {sensation:27233}  COORDINATION: ***  EDEMA:  {edema:24020}  MUSCLE TONE: {LE tone:25568}  MUSCLE LENGTH: Hamstrings: Right *** deg;  Left *** deg Debby test: Right *** deg; Left *** deg  DTRs:  {DTR SITE:24025}  POSTURE: {posture:25561}  LOWER EXTREMITY ROM:     {AROM/PROM:27142}  Right Eval Left Eval  Hip flexion    Hip extension    Hip abduction    Hip adduction    Hip internal rotation    Hip external rotation    Knee flexion    Knee extension    Ankle dorsiflexion    Ankle plantarflexion    Ankle inversion    Ankle eversion     (Blank rows = not tested)  LOWER EXTREMITY MMT:    MMT Right Eval Left Eval  Hip flexion    Hip extension    Hip abduction    Hip adduction    Hip internal rotation    Hip external rotation    Knee flexion    Knee extension    Ankle dorsiflexion    Ankle plantarflexion    Ankle inversion    Ankle eversion    (Blank rows = not tested)  BED MOBILITY:  {bed mobility:32615:p}  TRANSFERS: {transfers eval:32620}  RAMP:  {ramp eval:32616}  CURB:  {curb eval:32617}  STAIRS: {stairs  eval:32618} GAIT: Findings: {GaitneuroPT:32644::Distance walked: ***,Comments: ***}  FUNCTIONAL TESTS:  {Functional tests:24029}  PATIENT SURVEYS:  {rehab surveys:24030}                                                                                                                              TREATMENT DATE: *** PT Evaluation   PATIENT EDUCATION: Education details: *** Person educated: {Person educated:25204} Education method: {Education Method:25205} Education comprehension: {Education Comprehension:25206}  HOME EXERCISE PROGRAM: ***  GOALS: Goals reviewed with patient? {yes/no:20286}  SHORT TERM GOALS: Target date: ***  *** Baseline: Goal status: INITIAL  2.  *** Baseline:  Goal status: INITIAL  3.  *** Baseline:  Goal status: INITIAL  4.  *** Baseline:  Goal status: INITIAL  5.  *** Baseline:  Goal status: INITIAL  6.  *** Baseline:  Goal status: INITIAL  LONG TERM GOALS: Target date: ***  *** Baseline:  Goal status: INITIAL  2.  *** Baseline:  Goal status: INITIAL  3.  *** Baseline:  Goal status: INITIAL  4.  *** Baseline:  Goal status: INITIAL  5.  *** Baseline:  Goal status: INITIAL  6.  *** Baseline:  Goal status: INITIAL  ASSESSMENT:  CLINICAL IMPRESSION: Patient is a *** year old *** referred to Neuro OPPT for***.   Pt's PMH is significant for: *** The following deficits were present during the exam: ***. Based on ***, pt is an incr risk for falls. Pt would benefit from skilled PT to address these impairments and functional limitations to maximize functional mobility independence   OBJECTIVE IMPAIRMENTS: {opptimpairments:25111}.   ACTIVITY LIMITATIONS: {activitylimitations:27494}  PARTICIPATION LIMITATIONS: {participationrestrictions:25113}  PERSONAL FACTORS: {Personal factors:25162} are also affecting patient's functional outcome.   REHAB POTENTIAL: {rehabpotential:25112}  CLINICAL DECISION MAKING: {clinical  decision making:25114}  EVALUATION COMPLEXITY: {Evaluation complexity:25115}  PLAN:  PT FREQUENCY: {rehab frequency:25116}  PT DURATION: {rehab duration:25117}  PLANNED INTERVENTIONS: {rehab planned interventions:25118::97110-Therapeutic exercises,97530- Therapeutic [phone]- Neuromuscular re-education,97535- Self Rjmz,02859- Manual therapy,Patient/Family education}  PLAN FOR NEXT SESSION: ***   Waddell Southgate, PT Waddell Southgate, PT, DPT, CSRS  10/26/2024, 7:43 AM        "

## 2024-10-28 NOTE — Progress Notes (Signed)
 I agree with the above plan

## 2024-11-02 ENCOUNTER — Encounter: Payer: Self-pay | Admitting: Family

## 2024-11-02 ENCOUNTER — Telehealth: Payer: Self-pay | Admitting: *Deleted

## 2024-11-02 DIAGNOSIS — I6389 Other cerebral infarction: Secondary | ICD-10-CM

## 2024-11-02 NOTE — Telephone Encounter (Signed)
-----   Message from Duwaine Russell, NP sent at 11/02/2024  3:56 PM EST ----- Rhonda Cortez,   Please let patient know that Dr. Wynne recommends a 30-day cardiac monitor if she is amenable we can place the order  Megan ----- Message ----- From: Rosemarie Eather RAMAN, MD Sent: 10/28/2024   3:02 PM EST To: Duwaine Russell, NP  30-day monitoring of renal ----- Message ----- From: Russell Duwaine, NP Sent: 10/25/2024   3:32 PM EST To: Eather RAMAN Rosemarie, MD  With this being a left PCA and MCA watershed territories infarct-cardiac monitoring would be needed correct?  I did not see this mentioned in Dr. Jerri notes I wanted to double check

## 2024-11-02 NOTE — Telephone Encounter (Signed)
 Spoke to patient  will move forward with heart monitor

## 2024-11-03 ENCOUNTER — Other Ambulatory Visit: Payer: Self-pay | Admitting: Adult Health

## 2024-11-03 ENCOUNTER — Other Ambulatory Visit: Payer: Self-pay | Admitting: Family

## 2024-11-03 DIAGNOSIS — I6389 Other cerebral infarction: Secondary | ICD-10-CM

## 2024-11-03 DIAGNOSIS — R569 Unspecified convulsions: Secondary | ICD-10-CM

## 2024-11-03 DIAGNOSIS — I10 Essential (primary) hypertension: Secondary | ICD-10-CM

## 2024-11-03 DIAGNOSIS — I639 Cerebral infarction, unspecified: Secondary | ICD-10-CM

## 2024-11-03 MED ORDER — METOPROLOL SUCCINATE ER 25 MG PO TB24: 12.5000 mg | ORAL_TABLET | Freq: Every day | ORAL | 5 refills | Status: AC

## 2024-11-03 MED ORDER — LACOSAMIDE 50 MG PO TABS: 50.0000 mg | ORAL_TABLET | Freq: Two times a day (BID) | ORAL | 5 refills | Status: AC

## 2024-11-03 NOTE — Telephone Encounter (Signed)
 Metoprolol  and Locosamide RX sent to [facility]. The pantoprazole  has refills, she needs to just ask pharmacy to fill it. No, she should only be taking the 5mg  of Amlodipine  for now, ok to cut the 10mg  pills in half if she wants to use them, but use a pill splitter from the pharmacy.

## 2024-11-15 ENCOUNTER — Other Ambulatory Visit: Payer: Self-pay | Admitting: Registered Nurse

## 2024-11-19 ENCOUNTER — Encounter: Attending: Registered Nurse | Admitting: Physical Medicine & Rehabilitation

## 2024-11-19 ENCOUNTER — Encounter: Payer: Self-pay | Admitting: Physical Medicine & Rehabilitation

## 2024-11-19 VITALS — BP 159/97 | HR 77 | Ht 63.5 in | Wt 135.8 lb

## 2024-11-19 DIAGNOSIS — R569 Unspecified convulsions: Secondary | ICD-10-CM

## 2024-11-19 DIAGNOSIS — I10 Essential (primary) hypertension: Secondary | ICD-10-CM

## 2024-11-19 DIAGNOSIS — I619 Nontraumatic intracerebral hemorrhage, unspecified: Secondary | ICD-10-CM

## 2024-11-19 DIAGNOSIS — Z8673 Personal history of transient ischemic attack (TIA), and cerebral infarction without residual deficits: Secondary | ICD-10-CM

## 2024-11-19 DIAGNOSIS — I69319 Unspecified symptoms and signs involving cognitive functions following cerebral infarction: Secondary | ICD-10-CM

## 2024-11-19 NOTE — Patient Instructions (Signed)
" °  VISIT SUMMARY: During your visit, we discussed your cognitive, functional, and neurological status following your stroke. You reported dizziness upon standing, mild headaches, and short-term memory issues. We also reviewed your seizure precautions and blood pressure management.  YOUR PLAN: COGNITIVE IMPAIRMENT AND FUNCTIONAL DEFICITS: You have ongoing cognitive and functional deficits due to your stroke, including short-term memory issues. -Gradually reduce 24-hour supervision with intermittent safety checks. -Consider home safety strategies, such as removing stove knobs if safety concerns arise. -Attend outpatient speech therapy for cognitive rehabilitation, with twice weekly sessions as an option. -Consider a referral to neuropsychology for a comprehensive cognitive assessment and recommendations. -Contact the office if you wish to pursue outpatient therapy and neuropsychology options. -Follow up in three months to reassess cognitive status and functional independence.  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 -Remain seizure-free for six months before considering return to driving, per state law. -Obtain neurology clearance prior to resuming driving. -We will reassess your cognitive function at the next follow-up before referring to neurology for driving clearance. -Neurology may require an EEG prior to driving clearance.  HEADACHE AFTER STROKE: You experience mild, brief headaches with coughing or sneezing, which have improved. -Taper topiramate  to one dose daily for one week, then discontinue. -Choose whether to continue the daytime or nighttime dose during the taper.  ORTHOSTATIC HYPOTENSION: You reported dizziness upon standing, without falls or injury.  ELEVATED BLOOD PRESSURE (WHITE COAT HYPERTENSION): Your blood pressure was elevated in the office, but your home readings  are normal. -Continue home blood pressure monitoring. -Contact your primary care provider if home blood pressure readings become elevated.    Contains text generated by [employer].   "

## 2024-11-19 NOTE — Progress Notes (Signed)
 "  Subjective:    Patient ID: Rhonda Cortez, female    DOB: 21-Jun-1961, 64 y.o.   MRN: [id number]  HPI  Rhonda Cortez is a 64 y.o. female  with PMHX of hypertension, HLD, and history of CVA in 2014 presented to [facility] on 08/27/2024.  Per chart review the patient was supposed to arrive at a friend's home around 7 PM that night but did not arrive.  When the patient answered her phone around 3 AM she was confused and had difficulty finding words.  In the ED a head CT showed acute intracranial hemorrhage from the left caudate region with extension of the intraventricular region on the left.  Neurology was consulted and the patient was admitted to the ICU.  A repeat CT head showed caudate head hemorrhage with intraventricular extension.  Patient underwent an MRI that confirmed intraventricular hemorrhage, no evidence of underlying mass. 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  Neurology recommended no antithrombotics due to ICH.  Patient was on aspirin  after stroke (2014) for a number of years and then off. VTE prophylaxis SCDs. 2D echo with EF 60 to 65%.  Hospital course complicated by AKI, ongoing headaches, and new fever of unknown origin.    The patient was admitted to CIR on 11/20 and was doing well. She experienced sudden onset of right sided weakness with rigidity, decreased responsiveness, and not following commands. Code stroke was activated and the patient was transferred back to acute care on 11/22 for stroke workup. CT head showed resolving left caudate and left lateral ventricular hemorrhage without new hemorrhage.  CTA head and neck showing high-grade near occlusive stenosis in the proximal right P2 segment and proximal right P3 segment, moderate stenosis in the mid basilar artery, mild narrowing at the distal left A2 segment, and minimal atherosclerotic calcification at the right carotid bifurcation and minimal atherosclerotic  changes in the proximal left ICA, both without significant stenosis.     Per neurology, patient was not a candidate for TNK or EVT due to no LVO noted on imaging and felt symptoms were concerning for seizure in the setting of recent ICH.  She was started on LTM EEG monitoring was given Keppra  1500 mg load,  maintenance Keppra  500 mg twice daily and transition to Vimpat  on 11/24.  EEG suggestive of cortical dysfunction arising from left temporal region likely secondary to underlying structural abnormality. No seizures or epileptiform discharges were seen. Repeat MRI brain was performed and showed acute infarcts involving the left PCA and adjacent MCA/PCA watershed territories with mild cytotoxic edema.  Neurology recommended aspirin  but no DAPT given ICH history.   Admit date: 09/08/2024 Discharge date: 09/24/2024  Discussed the use of AI scribe software for clinical note transcription with the patient, who gave verbal consent to proceed.  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  She is currently living at home and no longer requires home health therapy, attending outpatient therapy in [place]. A friend is staying with her for support, and her sister lives nearby.  She reports two recent episodes of dizziness upon rising from a lying position, described as near-falls without actual falls to the ground. She continues to experience mild, brief headaches triggered by coughing or sneezing, lasting only one second. She is  taking topiramate  for post-stroke headaches. She occasionally forgets her nighttime dose and uses a pill box to assist with medication adherence.  She identifies short-term memory impairment as her most significant ongoing issue, with difficulty recalling whether she has taken her medication and trouble remembering  the current month, though she remains oriented to day and place. She is able to recall information with cues and is engaging in independent cognitive activities such as watching television.  She has not resumed driving due to seizure precautions and cognitive deficits. She is aware of the requirement to be seizure-free for six months and to obtain neurologist clearance prior to returning to driving. No further seizures have occurred since the initial suspected event in November 2025.  She continues to experience right peripheral visual field loss, resulting in difficulty with peripheral awareness and frequent collisions with objects such as door edges. She reports some improvement but continues to have visual disturbances, particularly when viewing objects on the computer. She has an upcoming ophthalmology appointment for further evaluation.  Her blood pressure was elevated at today's visit, which she attributes to taking her medication without eating and anxiety in medical settings. At home, her blood pressure readings are typically around 130/80 mmHg.   Pain Inventory Average Pain 5 Pain Right Now 0 My pain is intermittent and dull  In the last 24 hours, has pain interfered with the following? General activity 0 Relation with others 0 Enjoyment of life 0 What TIME of day is your pain at its worst? varies Sleep (in general) Good  Pain is worse with: unsure Pain improves with: rest Relief from Meds: no med  Family History  Problem Relation Age of Onset   Diabetes Mother    Kidney disease Father    Hypertension Father    Hypertension Sister    Hypertension Brother    Asthma Son    Social History   Socioeconomic History   Marital status: Married    Spouse name: Not on file   Number of children: 2   Years of education: Not on file   Highest education level: GED or equivalent  Occupational History   Not on file  Tobacco Use   Smoking status: Never   Smokeless tobacco: Never   Vaping Use   Vaping status: Never Used  Substance and Sexual Activity   Alcohol use: No   Drug use: Never   Sexual activity: Not Currently    Birth control/protection: Surgical  Other Topics Concern   Not on file  Social History Narrative   Patient does live alone   Patient is currently out of work due to the stroke.    Social Drivers of Health   Tobacco Use: Low Risk (11/19/2024)   Patient History    Smoking Tobacco Use: Never    Smokeless Tobacco Use: Never    Passive Exposure: Not on file  Financial Resource Strain: Low Risk (02/17/2024)   Overall Financial Resource Strain (CARDIA)    Difficulty of Paying Living Expenses: Not hard at all  Food Insecurity: No Food Insecurity (09/08/2024)   Epic    Worried About Programme Researcher, Broadcasting/film/video in the Last Year: Never true    Ran Out of Food in the Last Year: Never true  Transportation Needs: No Transportation Needs (09/08/2024)   Epic    Lack of Transportation (Medical): No    Lack of Transportation (Non-Medical): No  Physical Activity: Insufficiently Active (02/17/2024)   Exercise Vital Sign    Days of Exercise per Week:  5 days    Minutes of Exercise per Session: 20 min  Stress: No Stress Concern Present (02/17/2024)   [employer] of Occupational Health - Occupational Stress Questionnaire    Feeling of Stress : Not at all  Social Connections: Socially Isolated (02/17/2024)   Social Connection and Isolation Panel    Frequency of Communication with Friends and Family: Twice a week    Frequency of Social Gatherings with Friends and Family: Never    Attends Religious Services: More than 4 times per year    Active Member of Clubs or Organizations: No    Attends Engineer, Structural: Not on file    Marital Status: Never married  Depression (PHQ2-9): Low Risk (11/19/2024)   Depression (PHQ2-9)    PHQ-2 Score: 2  Recent Concern: Depression (PHQ2-9) - High Risk (10/13/2024)   Depression (PHQ2-9)    PHQ-2 Score: 18  Alcohol  Screen: Not on file  Housing: Unknown (09/08/2024)   Epic    Unable to Pay for Housing in the Last Year: No    Number of Times Moved in the Last Year: Not on file    Homeless in the Last Year: No  Utilities: Not At Risk (09/08/2024)   Epic    Threatened with loss of utilities: No  Health Literacy: Not on file   Past Surgical History:  Procedure Laterality Date   OOPHORECTOMY     TUBAL LIGATION     Past Surgical History:  Procedure Laterality Date   OOPHORECTOMY     TUBAL LIGATION     Past Medical History:  Diagnosis Date   Hypertension    Stroke (HCC)    BP (!) 175/100   Pulse 77   Ht 5' 3.5 (1.613 m)   Wt 135 lb 12.8 oz (61.6 kg)   SpO2 98%   BMI 23.68 kg/m   Opioid Risk Score:   Fall Risk Score:  `1  Depression screen PHQ 2/9     11/19/2024   10:14 AM 10/13/2024    2:10 PM 10/13/2024   10:55 AM 02/17/2024   11:47 AM 01/03/2023   12:00 PM  Depression screen PHQ 2/9  Decreased Interest 0 1 3 0 1  Down, Depressed, Hopeless 0 0 3 0 0  PHQ - 2 Score 0 1 6 0 1  Altered sleeping  0 2  1  Tired, decreased energy  1 2  1   Change in appetite  0 0  0  Feeling bad or failure about yourself   0 3  0  Trouble concentrating  0 3  0  Moving slowly or fidgety/restless  0 2  0  Suicidal thoughts  0 0  0  PHQ-9 Score  2 18  3    Difficult doing work/chores  Not difficult at all   Somewhat difficult     Data saved with a previous flowsheet row definition     Review of Systems  Neurological:  Positive for headaches.  All other systems reviewed and are negative.      Objective:   Physical Exam  Oriented to person and place but not time Mood and affect without lability or agitation Extremities without edema Range of motion is full in bilateral upper and lower limbs  Motor is normal bilateral hands with finger to thumb opposition No evidence of dysdiadochokinesis with rapid alternating supination pronation of bilateral upper extremities Motor strength is 5/5  bilateral deltoid, bicep, tricep, grip, hip flexor, knee extensor, ankle dorsiflexor and plantar flex  Sensation is reported is equal bilateral upper and lower limbs to light touch Tone is normal bilateral upper and lower limbs There is no evidence of dysmetria on finger-nose-finger testing in bilateral upper limbs She ambulates without assistive device no evidence of toe drag or knee instability, she does have difficulty navigating in the clinic Due to not knowing which way to turn even with cueing Visual fields are intact confrontation testing  Memory is 0/3 unrelated objects after 3-minute delay       Assessment & Plan:   Assessment and Plan Assessment & Plan Cognitive impairment and functional deficits secondary to 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 Anticipated cognitive recovery over 6-9 months. - Recommended gradual reduction of 24-hour supervision with intermittent safety checks. - Discussed home safety strategies, including removal of stove knobs if safety concerns arise. - Discussed outpatient speech therapy for cognitive rehabilitation, with twice weekly sessions as an option. - Discussed referral to neuropsychology for comprehensive cognitive assessment and recommendations; offered to arrange appointment with neuropsychologist in the same office. - Advised her to consider financial implications of outpatient therapy and neuropsychology, and to contact the office if she wishes to pursue these options. - Scheduled follow-up in three months to reassess cognitive status and functional independence.  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 State law requires six months seizure-free and neurology clearance for driving. Cognitive deficits may impact driving readiness. - Advised she must remain seizure-free for six  months before considering return to driving, per state law. - Instructed to obtain neurology clearance prior to resuming driving. - Planned to reassess cognitive function at next follow-up before referring to neurology for driving clearance. - Discussed that neurology may require EEG prior to driving clearance.  Headache after stroke Mild, brief headaches with coughing or sneezing, improved and minimal. Prescribed topiramate  for post-stroke headaches. - Recommended tapering topiramate  to one dose daily for one week, then discontinuing. - Instructed her to choose whether to continue the daytime or nighttime dose during the taper.  Orthostatic hypotension Reported two episodes of dizziness upon standing, without falls or injury.  Elevated blood pressure (white coat hypertension) Office blood pressure elevated, home readings normal. Experiences anxiety in medical settings, consistent with white coat hypertension. - Instructed to continue home blood pressure monitoring. - Advised to contact primary care provider if home blood pressure readings become elevated.   "

## 2024-12-31 ENCOUNTER — Ambulatory Visit: Admitting: Family

## 2025-02-17 ENCOUNTER — Encounter: Admitting: Physical Medicine & Rehabilitation

## 2025-10-03 ENCOUNTER — Ambulatory Visit: Admitting: Adult Health
